# Patient Record
Sex: Male | Born: 1935
Health system: Southern US, Community
[De-identification: ages and names within clinical notes are randomized; demographics above are authoritative.]

## PROBLEM LIST (undated history)

## (undated) DIAGNOSIS — J449 Chronic obstructive pulmonary disease, unspecified: Secondary | ICD-10-CM

## (undated) DIAGNOSIS — G459 Transient cerebral ischemic attack, unspecified: Secondary | ICD-10-CM

## (undated) DIAGNOSIS — I10 Essential (primary) hypertension: Secondary | ICD-10-CM

## (undated) DIAGNOSIS — K8020 Calculus of gallbladder without cholecystitis without obstruction: Secondary | ICD-10-CM

## (undated) DIAGNOSIS — I639 Cerebral infarction, unspecified: Secondary | ICD-10-CM

## (undated) DIAGNOSIS — E119 Type 2 diabetes mellitus without complications: Secondary | ICD-10-CM

## (undated) DIAGNOSIS — H409 Unspecified glaucoma: Secondary | ICD-10-CM

## (undated) DIAGNOSIS — I679 Cerebrovascular disease, unspecified: Secondary | ICD-10-CM

## (undated) HISTORY — DX: Transient cerebral ischemic attack, unspecified: G45.9

## (undated) HISTORY — DX: Unspecified glaucoma: H40.9

## (undated) HISTORY — DX: Calculus of gallbladder without cholecystitis without obstruction: K80.20

## (undated) HISTORY — PX: CHOLECYSTECTOMY: SHX55

## (undated) HISTORY — DX: Chronic obstructive pulmonary disease, unspecified: J44.9

## (undated) HISTORY — PX: FRACTURE SURGERY: SHX138

## (undated) HISTORY — PX: INGUINAL HERNIA REPAIR: SUR1180

## (undated) HISTORY — PX: CATARACT EXTRACTION W/ INTRAOCULAR LENS  IMPLANT, BILATERAL: SHX1307

## (undated) HISTORY — PX: EYE SURGERY: SHX253

## (undated) HISTORY — PX: HERNIA REPAIR: SHX51

## (undated) HISTORY — DX: Cerebrovascular disease, unspecified: I67.9

## (undated) HISTORY — PX: COCHLEAR IMPLANT: SUR684

## (undated) HISTORY — PX: APPENDECTOMY: SHX54

## (undated) HISTORY — PX: TONSILLECTOMY: SUR1361

---
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
## 1969-05-30 HISTORY — PX: VASECTOMY: SHX75

## 2005-12-06 ENCOUNTER — Ambulatory Visit (HOSPITAL_BASED_OUTPATIENT_CLINIC_OR_DEPARTMENT_OTHER): Admission: RE | Admit: 2005-12-06 | Discharge: 2005-12-06 | Payer: Self-pay | Admitting: Surgery

## 2006-09-07 ENCOUNTER — Encounter: Admission: RE | Admit: 2006-09-07 | Discharge: 2006-09-07 | Payer: Self-pay | Admitting: Surgery

## 2006-09-11 ENCOUNTER — Ambulatory Visit (HOSPITAL_BASED_OUTPATIENT_CLINIC_OR_DEPARTMENT_OTHER): Admission: RE | Admit: 2006-09-11 | Discharge: 2006-09-11 | Payer: Self-pay | Admitting: Surgery

## 2011-07-01 DIAGNOSIS — I10 Essential (primary) hypertension: Secondary | ICD-10-CM | POA: Diagnosis not present

## 2011-07-01 DIAGNOSIS — E119 Type 2 diabetes mellitus without complications: Secondary | ICD-10-CM | POA: Diagnosis not present

## 2011-07-21 DIAGNOSIS — H409 Unspecified glaucoma: Secondary | ICD-10-CM | POA: Diagnosis not present

## 2011-07-21 DIAGNOSIS — H4011X0 Primary open-angle glaucoma, stage unspecified: Secondary | ICD-10-CM | POA: Diagnosis not present

## 2011-12-09 DIAGNOSIS — D126 Benign neoplasm of colon, unspecified: Secondary | ICD-10-CM | POA: Diagnosis not present

## 2011-12-09 DIAGNOSIS — K5730 Diverticulosis of large intestine without perforation or abscess without bleeding: Secondary | ICD-10-CM | POA: Diagnosis not present

## 2011-12-09 DIAGNOSIS — Z09 Encounter for follow-up examination after completed treatment for conditions other than malignant neoplasm: Secondary | ICD-10-CM | POA: Diagnosis not present

## 2011-12-09 DIAGNOSIS — Z86010 Personal history of colonic polyps: Secondary | ICD-10-CM | POA: Diagnosis not present

## 2012-01-13 DIAGNOSIS — E119 Type 2 diabetes mellitus without complications: Secondary | ICD-10-CM | POA: Diagnosis not present

## 2012-01-13 DIAGNOSIS — Z23 Encounter for immunization: Secondary | ICD-10-CM | POA: Diagnosis not present

## 2012-01-13 DIAGNOSIS — I10 Essential (primary) hypertension: Secondary | ICD-10-CM | POA: Diagnosis not present

## 2012-01-19 DIAGNOSIS — H409 Unspecified glaucoma: Secondary | ICD-10-CM | POA: Diagnosis not present

## 2012-01-19 DIAGNOSIS — H4011X0 Primary open-angle glaucoma, stage unspecified: Secondary | ICD-10-CM | POA: Diagnosis not present

## 2012-02-23 DIAGNOSIS — Z23 Encounter for immunization: Secondary | ICD-10-CM | POA: Diagnosis not present

## 2012-06-11 DIAGNOSIS — H6120 Impacted cerumen, unspecified ear: Secondary | ICD-10-CM | POA: Diagnosis not present

## 2012-06-11 DIAGNOSIS — H905 Unspecified sensorineural hearing loss: Secondary | ICD-10-CM | POA: Diagnosis not present

## 2012-07-20 DIAGNOSIS — E119 Type 2 diabetes mellitus without complications: Secondary | ICD-10-CM | POA: Diagnosis not present

## 2012-07-20 DIAGNOSIS — I10 Essential (primary) hypertension: Secondary | ICD-10-CM | POA: Diagnosis not present

## 2012-07-25 DIAGNOSIS — H409 Unspecified glaucoma: Secondary | ICD-10-CM | POA: Diagnosis not present

## 2012-07-25 DIAGNOSIS — H4011X0 Primary open-angle glaucoma, stage unspecified: Secondary | ICD-10-CM | POA: Diagnosis not present

## 2012-11-22 DIAGNOSIS — H4011X0 Primary open-angle glaucoma, stage unspecified: Secondary | ICD-10-CM | POA: Diagnosis not present

## 2012-11-22 DIAGNOSIS — H409 Unspecified glaucoma: Secondary | ICD-10-CM | POA: Diagnosis not present

## 2012-11-22 DIAGNOSIS — D3130 Benign neoplasm of unspecified choroid: Secondary | ICD-10-CM | POA: Diagnosis not present

## 2013-01-23 DIAGNOSIS — E119 Type 2 diabetes mellitus without complications: Secondary | ICD-10-CM | POA: Diagnosis not present

## 2013-01-23 DIAGNOSIS — I10 Essential (primary) hypertension: Secondary | ICD-10-CM | POA: Diagnosis not present

## 2013-03-26 DIAGNOSIS — Z23 Encounter for immunization: Secondary | ICD-10-CM | POA: Diagnosis not present

## 2013-04-04 DIAGNOSIS — H4011X0 Primary open-angle glaucoma, stage unspecified: Secondary | ICD-10-CM | POA: Diagnosis not present

## 2013-04-04 DIAGNOSIS — H409 Unspecified glaucoma: Secondary | ICD-10-CM | POA: Diagnosis not present

## 2013-05-07 DIAGNOSIS — R42 Dizziness and giddiness: Secondary | ICD-10-CM | POA: Diagnosis not present

## 2013-05-07 DIAGNOSIS — I951 Orthostatic hypotension: Secondary | ICD-10-CM | POA: Diagnosis not present

## 2013-06-03 DIAGNOSIS — R42 Dizziness and giddiness: Secondary | ICD-10-CM | POA: Diagnosis not present

## 2013-06-03 DIAGNOSIS — E7800 Pure hypercholesterolemia, unspecified: Secondary | ICD-10-CM | POA: Diagnosis not present

## 2013-06-03 DIAGNOSIS — E119 Type 2 diabetes mellitus without complications: Secondary | ICD-10-CM | POA: Diagnosis not present

## 2013-06-07 ENCOUNTER — Other Ambulatory Visit (HOSPITAL_BASED_OUTPATIENT_CLINIC_OR_DEPARTMENT_OTHER): Payer: Self-pay | Admitting: Family Medicine

## 2013-06-07 DIAGNOSIS — R42 Dizziness and giddiness: Secondary | ICD-10-CM

## 2013-06-08 ENCOUNTER — Ambulatory Visit (HOSPITAL_BASED_OUTPATIENT_CLINIC_OR_DEPARTMENT_OTHER)
Admission: RE | Admit: 2013-06-08 | Discharge: 2013-06-08 | Disposition: A | Discharge status: Home / Self Care | Payer: Medicare Other | Source: Ambulatory Visit | Attending: Family Medicine | Admitting: Family Medicine

## 2013-06-08 DIAGNOSIS — I6350 Cerebral infarction due to unspecified occlusion or stenosis of unspecified cerebral artery: Secondary | ICD-10-CM | POA: Insufficient documentation

## 2013-06-08 DIAGNOSIS — R42 Dizziness and giddiness: Secondary | ICD-10-CM

## 2013-06-08 DIAGNOSIS — G319 Degenerative disease of nervous system, unspecified: Secondary | ICD-10-CM | POA: Insufficient documentation

## 2013-06-19 DIAGNOSIS — H6120 Impacted cerumen, unspecified ear: Secondary | ICD-10-CM | POA: Diagnosis not present

## 2013-06-19 DIAGNOSIS — H905 Unspecified sensorineural hearing loss: Secondary | ICD-10-CM | POA: Diagnosis not present

## 2013-06-26 DIAGNOSIS — R42 Dizziness and giddiness: Secondary | ICD-10-CM | POA: Diagnosis not present

## 2013-06-26 DIAGNOSIS — I6350 Cerebral infarction due to unspecified occlusion or stenosis of unspecified cerebral artery: Secondary | ICD-10-CM | POA: Diagnosis not present

## 2013-07-04 DIAGNOSIS — R42 Dizziness and giddiness: Secondary | ICD-10-CM | POA: Diagnosis not present

## 2013-07-04 DIAGNOSIS — I6350 Cerebral infarction due to unspecified occlusion or stenosis of unspecified cerebral artery: Secondary | ICD-10-CM | POA: Diagnosis not present

## 2013-07-11 DIAGNOSIS — R42 Dizziness and giddiness: Secondary | ICD-10-CM | POA: Diagnosis not present

## 2013-07-11 DIAGNOSIS — I6350 Cerebral infarction due to unspecified occlusion or stenosis of unspecified cerebral artery: Secondary | ICD-10-CM | POA: Diagnosis not present

## 2013-07-31 DIAGNOSIS — E7800 Pure hypercholesterolemia, unspecified: Secondary | ICD-10-CM | POA: Diagnosis not present

## 2013-07-31 DIAGNOSIS — E119 Type 2 diabetes mellitus without complications: Secondary | ICD-10-CM | POA: Diagnosis not present

## 2013-07-31 DIAGNOSIS — I10 Essential (primary) hypertension: Secondary | ICD-10-CM | POA: Diagnosis not present

## 2013-07-31 DIAGNOSIS — I679 Cerebrovascular disease, unspecified: Secondary | ICD-10-CM | POA: Diagnosis not present

## 2013-07-31 DIAGNOSIS — Z79899 Other long term (current) drug therapy: Secondary | ICD-10-CM | POA: Diagnosis not present

## 2013-08-08 DIAGNOSIS — H409 Unspecified glaucoma: Secondary | ICD-10-CM | POA: Diagnosis not present

## 2013-08-08 DIAGNOSIS — H4011X0 Primary open-angle glaucoma, stage unspecified: Secondary | ICD-10-CM | POA: Diagnosis not present

## 2013-12-12 DIAGNOSIS — H409 Unspecified glaucoma: Secondary | ICD-10-CM | POA: Diagnosis not present

## 2013-12-12 DIAGNOSIS — H4011X0 Primary open-angle glaucoma, stage unspecified: Secondary | ICD-10-CM | POA: Diagnosis not present

## 2014-01-30 DIAGNOSIS — Z23 Encounter for immunization: Secondary | ICD-10-CM | POA: Diagnosis not present

## 2014-01-30 DIAGNOSIS — E119 Type 2 diabetes mellitus without complications: Secondary | ICD-10-CM | POA: Diagnosis not present

## 2014-01-30 DIAGNOSIS — I10 Essential (primary) hypertension: Secondary | ICD-10-CM | POA: Diagnosis not present

## 2014-01-30 DIAGNOSIS — E7800 Pure hypercholesterolemia, unspecified: Secondary | ICD-10-CM | POA: Diagnosis not present

## 2014-06-05 DIAGNOSIS — H4011X1 Primary open-angle glaucoma, mild stage: Secondary | ICD-10-CM | POA: Diagnosis not present

## 2014-06-11 DIAGNOSIS — H905 Unspecified sensorineural hearing loss: Secondary | ICD-10-CM | POA: Diagnosis not present

## 2014-06-26 DIAGNOSIS — Z974 Presence of external hearing-aid: Secondary | ICD-10-CM | POA: Diagnosis not present

## 2014-06-26 DIAGNOSIS — H903 Sensorineural hearing loss, bilateral: Secondary | ICD-10-CM | POA: Diagnosis not present

## 2014-06-26 DIAGNOSIS — H6123 Impacted cerumen, bilateral: Secondary | ICD-10-CM | POA: Diagnosis not present

## 2014-07-29 DIAGNOSIS — I10 Essential (primary) hypertension: Secondary | ICD-10-CM | POA: Diagnosis not present

## 2014-07-29 DIAGNOSIS — E119 Type 2 diabetes mellitus without complications: Secondary | ICD-10-CM | POA: Diagnosis not present

## 2014-07-29 DIAGNOSIS — E780 Pure hypercholesterolemia: Secondary | ICD-10-CM | POA: Diagnosis not present

## 2014-07-29 DIAGNOSIS — Z1389 Encounter for screening for other disorder: Secondary | ICD-10-CM | POA: Diagnosis not present

## 2014-09-09 DIAGNOSIS — Z0000 Encounter for general adult medical examination without abnormal findings: Secondary | ICD-10-CM | POA: Diagnosis not present

## 2014-12-11 DIAGNOSIS — H40013 Open angle with borderline findings, low risk, bilateral: Secondary | ICD-10-CM | POA: Diagnosis not present

## 2015-02-05 DIAGNOSIS — I10 Essential (primary) hypertension: Secondary | ICD-10-CM | POA: Diagnosis not present

## 2015-02-05 DIAGNOSIS — Z23 Encounter for immunization: Secondary | ICD-10-CM | POA: Diagnosis not present

## 2015-02-05 DIAGNOSIS — E780 Pure hypercholesterolemia: Secondary | ICD-10-CM | POA: Diagnosis not present

## 2015-02-05 DIAGNOSIS — E119 Type 2 diabetes mellitus without complications: Secondary | ICD-10-CM | POA: Diagnosis not present

## 2015-06-12 DIAGNOSIS — D3131 Benign neoplasm of right choroid: Secondary | ICD-10-CM | POA: Diagnosis not present

## 2015-06-12 DIAGNOSIS — H524 Presbyopia: Secondary | ICD-10-CM | POA: Diagnosis not present

## 2015-06-12 DIAGNOSIS — H40013 Open angle with borderline findings, low risk, bilateral: Secondary | ICD-10-CM | POA: Diagnosis not present

## 2015-07-22 DIAGNOSIS — H903 Sensorineural hearing loss, bilateral: Secondary | ICD-10-CM | POA: Diagnosis not present

## 2015-07-29 DIAGNOSIS — H9193 Unspecified hearing loss, bilateral: Secondary | ICD-10-CM | POA: Insufficient documentation

## 2015-07-29 DIAGNOSIS — H6123 Impacted cerumen, bilateral: Secondary | ICD-10-CM

## 2015-07-29 DIAGNOSIS — H903 Sensorineural hearing loss, bilateral: Secondary | ICD-10-CM | POA: Diagnosis not present

## 2015-07-29 DIAGNOSIS — Z974 Presence of external hearing-aid: Secondary | ICD-10-CM | POA: Diagnosis not present

## 2015-07-29 HISTORY — DX: Unspecified hearing loss, bilateral: H91.93

## 2015-07-29 HISTORY — DX: Impacted cerumen, bilateral: H61.23

## 2015-08-05 DIAGNOSIS — E119 Type 2 diabetes mellitus without complications: Secondary | ICD-10-CM | POA: Diagnosis not present

## 2015-08-05 DIAGNOSIS — Z23 Encounter for immunization: Secondary | ICD-10-CM | POA: Diagnosis not present

## 2015-08-05 DIAGNOSIS — E7800 Pure hypercholesterolemia, unspecified: Secondary | ICD-10-CM | POA: Diagnosis not present

## 2015-08-05 DIAGNOSIS — L84 Corns and callosities: Secondary | ICD-10-CM | POA: Diagnosis not present

## 2015-08-05 DIAGNOSIS — Z1389 Encounter for screening for other disorder: Secondary | ICD-10-CM | POA: Diagnosis not present

## 2015-08-05 DIAGNOSIS — I10 Essential (primary) hypertension: Secondary | ICD-10-CM | POA: Diagnosis not present

## 2015-08-05 DIAGNOSIS — I679 Cerebrovascular disease, unspecified: Secondary | ICD-10-CM | POA: Diagnosis not present

## 2015-08-14 DIAGNOSIS — M795 Residual foreign body in soft tissue: Secondary | ICD-10-CM | POA: Diagnosis not present

## 2015-12-17 DIAGNOSIS — D3131 Benign neoplasm of right choroid: Secondary | ICD-10-CM | POA: Diagnosis not present

## 2015-12-17 DIAGNOSIS — H524 Presbyopia: Secondary | ICD-10-CM | POA: Diagnosis not present

## 2015-12-17 DIAGNOSIS — H40013 Open angle with borderline findings, low risk, bilateral: Secondary | ICD-10-CM | POA: Diagnosis not present

## 2016-02-19 DIAGNOSIS — E7800 Pure hypercholesterolemia, unspecified: Secondary | ICD-10-CM | POA: Diagnosis not present

## 2016-02-19 DIAGNOSIS — I10 Essential (primary) hypertension: Secondary | ICD-10-CM | POA: Diagnosis not present

## 2016-02-19 DIAGNOSIS — I679 Cerebrovascular disease, unspecified: Secondary | ICD-10-CM | POA: Diagnosis not present

## 2016-02-19 DIAGNOSIS — E119 Type 2 diabetes mellitus without complications: Secondary | ICD-10-CM | POA: Diagnosis not present

## 2016-02-19 DIAGNOSIS — Z23 Encounter for immunization: Secondary | ICD-10-CM | POA: Diagnosis not present

## 2016-05-16 DIAGNOSIS — J069 Acute upper respiratory infection, unspecified: Secondary | ICD-10-CM | POA: Diagnosis not present

## 2016-07-07 DIAGNOSIS — H04123 Dry eye syndrome of bilateral lacrimal glands: Secondary | ICD-10-CM | POA: Diagnosis not present

## 2016-07-07 DIAGNOSIS — H40013 Open angle with borderline findings, low risk, bilateral: Secondary | ICD-10-CM | POA: Diagnosis not present

## 2016-07-07 DIAGNOSIS — D3131 Benign neoplasm of right choroid: Secondary | ICD-10-CM | POA: Diagnosis not present

## 2016-08-11 DIAGNOSIS — I679 Cerebrovascular disease, unspecified: Secondary | ICD-10-CM | POA: Diagnosis not present

## 2016-08-11 DIAGNOSIS — Z23 Encounter for immunization: Secondary | ICD-10-CM | POA: Diagnosis not present

## 2016-08-11 DIAGNOSIS — Z0000 Encounter for general adult medical examination without abnormal findings: Secondary | ICD-10-CM | POA: Diagnosis not present

## 2016-08-11 DIAGNOSIS — E119 Type 2 diabetes mellitus without complications: Secondary | ICD-10-CM | POA: Diagnosis not present

## 2016-08-11 DIAGNOSIS — E7800 Pure hypercholesterolemia, unspecified: Secondary | ICD-10-CM | POA: Diagnosis not present

## 2016-08-11 DIAGNOSIS — I10 Essential (primary) hypertension: Secondary | ICD-10-CM | POA: Diagnosis not present

## 2016-08-24 DIAGNOSIS — Z7289 Other problems related to lifestyle: Secondary | ICD-10-CM | POA: Diagnosis not present

## 2016-08-24 DIAGNOSIS — H903 Sensorineural hearing loss, bilateral: Secondary | ICD-10-CM | POA: Diagnosis not present

## 2016-08-24 DIAGNOSIS — Z974 Presence of external hearing-aid: Secondary | ICD-10-CM | POA: Diagnosis not present

## 2016-08-24 DIAGNOSIS — H6123 Impacted cerumen, bilateral: Secondary | ICD-10-CM | POA: Diagnosis not present

## 2016-10-31 ENCOUNTER — Emergency Department (HOSPITAL_BASED_OUTPATIENT_CLINIC_OR_DEPARTMENT_OTHER)
Admission: EM | Admit: 2016-10-31 | Discharge: 2016-10-31 | Disposition: A | Discharge status: Home / Self Care | Payer: Medicare Other | Attending: Emergency Medicine | Admitting: Emergency Medicine

## 2016-10-31 ENCOUNTER — Emergency Department (HOSPITAL_BASED_OUTPATIENT_CLINIC_OR_DEPARTMENT_OTHER): Payer: Medicare Other

## 2016-10-31 ENCOUNTER — Encounter (HOSPITAL_BASED_OUTPATIENT_CLINIC_OR_DEPARTMENT_OTHER): Payer: Self-pay | Admitting: Emergency Medicine

## 2016-10-31 ENCOUNTER — Emergency Department (HOSPITAL_COMMUNITY): Payer: Medicare Other

## 2016-10-31 DIAGNOSIS — E119 Type 2 diabetes mellitus without complications: Secondary | ICD-10-CM | POA: Insufficient documentation

## 2016-10-31 DIAGNOSIS — R42 Dizziness and giddiness: Secondary | ICD-10-CM | POA: Insufficient documentation

## 2016-10-31 DIAGNOSIS — I10 Essential (primary) hypertension: Secondary | ICD-10-CM | POA: Insufficient documentation

## 2016-10-31 DIAGNOSIS — R260 Ataxic gait: Secondary | ICD-10-CM | POA: Diagnosis not present

## 2016-10-31 DIAGNOSIS — H8193 Unspecified disorder of vestibular function, bilateral: Secondary | ICD-10-CM | POA: Diagnosis not present

## 2016-10-31 DIAGNOSIS — R269 Unspecified abnormalities of gait and mobility: Secondary | ICD-10-CM | POA: Diagnosis not present

## 2016-10-31 DIAGNOSIS — Z8673 Personal history of transient ischemic attack (TIA), and cerebral infarction without residual deficits: Secondary | ICD-10-CM | POA: Insufficient documentation

## 2016-10-31 HISTORY — DX: Type 2 diabetes mellitus without complications: E11.9

## 2016-10-31 HISTORY — DX: Essential (primary) hypertension: I10

## 2016-10-31 HISTORY — DX: Cerebral infarction, unspecified: I63.9

## 2016-10-31 LAB — CBG MONITORING, ED: Glucose-Capillary: 141 mg/dL — ABNORMAL HIGH (ref 65–99)

## 2016-10-31 LAB — BASIC METABOLIC PANEL
ANION GAP: 6 (ref 5–15)
BUN: 18 mg/dL (ref 6–20)
CO2: 28 mmol/L (ref 22–32)
Calcium: 9.2 mg/dL (ref 8.9–10.3)
Chloride: 102 mmol/L (ref 101–111)
Creatinine, Ser: 1.01 mg/dL (ref 0.61–1.24)
GFR calc Af Amer: >60 mL/min (ref >60)
GFR calc non Af Amer: >60 mL/min (ref >60)
GLUCOSE: 149 mg/dL — AB (ref 65–99)
POTASSIUM: 4 mmol/L (ref 3.5–5.1)
Sodium: 136 mmol/L (ref 135–145)

## 2016-10-31 LAB — URINALYSIS, ROUTINE W REFLEX MICROSCOPIC
Bilirubin Urine: NEGATIVE
GLUCOSE, UA: NEGATIVE mg/dL
HGB URINE DIPSTICK: NEGATIVE
Ketones, ur: NEGATIVE mg/dL
Leukocytes, UA: NEGATIVE
Nitrite: NEGATIVE
PH: 7 (ref 5.0–8.0)
Protein, ur: NEGATIVE mg/dL
SPECIFIC GRAVITY, URINE: 1.01 (ref 1.005–1.030)

## 2016-10-31 LAB — CBC
HEMATOCRIT: 38.1 % — AB (ref 39.0–52.0)
HEMOGLOBIN: 13.4 g/dL (ref 13.0–17.0)
MCH: 31.2 pg (ref 26.0–34.0)
MCHC: 35.2 g/dL (ref 30.0–36.0)
MCV: 88.6 fL (ref 78.0–100.0)
Platelets: 178 10*3/uL (ref 150–400)
RBC: 4.3 MIL/uL (ref 4.22–5.81)
RDW: 13.3 % (ref 11.5–15.5)
WBC: 6.1 10*3/uL (ref 4.0–10.5)

## 2016-10-31 MED ORDER — GADOBENATE DIMEGLUMINE 529 MG/ML IV SOLN
15.0000 mL | Freq: Once | INTRAVENOUS | Status: AC
  Administered 2016-10-31: 12 mL via INTRAVENOUS

## 2016-10-31 MED ORDER — MECLIZINE HCL 25 MG PO TABS: 25.0000 mg | ORAL_TABLET | Freq: Three times a day (TID) | ORAL | 0 refills | Status: DC | PRN

## 2016-10-31 MED ORDER — MECLIZINE HCL 25 MG PO TABS
25.0000 mg | ORAL_TABLET | Freq: Once | ORAL | Status: AC
  Administered 2016-10-31: 25 mg via ORAL
  Filled 2016-10-31: qty 1

## 2016-10-31 NOTE — ED Notes (Signed)
Patient transported to CT 

## 2016-10-31 NOTE — ED Provider Notes (Signed)
  Physical Exam  BP (!) 155/80 (BP Location: Right Arm)   Pulse 68   Temp 98.3 F (36.8 C) (Oral)   Resp 16   Ht 5\' 10"  (1.778 m)   Wt 57.2 kg (126 lb)   SpO2 99%   BMI 18.08 kg/m   Physical Exam  ED Course  Procedures  MDM     MRI results reviewed. No acute stroke but does have potential chronic vasculitis. Will follow-up with neurology. Discharge home. Discussed with patient and his family member.    Davonna Belling, MD 10/31/16 1924

## 2016-10-31 NOTE — ED Notes (Signed)
Pt attempted to urinate Pt was unable too.

## 2016-10-31 NOTE — ED Notes (Signed)
Patient able to ambulate independently  

## 2016-10-31 NOTE — ED Triage Notes (Signed)
Pt with intermittent dizzy spells since Friday. Wife states last night he went to bed at 12 midnight and was fine. This morning at 0800 pt reports dizziness and head fullness. Hx of stroke. A/O x4.

## 2016-10-31 NOTE — ED Notes (Signed)
Patient updated on delay for MRI

## 2016-10-31 NOTE — ED Provider Notes (Signed)
[place] DEPT Provider Note   CSN: [id number] Arrival date & time: 10/31/16  5465     History   Chief Complaint Chief Complaint  Patient presents with  . Dizziness    HPI Raymond Fowler is a 83 y.o. male.  HPI  This is a 83 year old man with a history of diabetes, hypertension, and stroke who presents here from [place] for [place]. Patient states he has had difficulties with his balance since Friday. He describes it as things being off center and he is having difficulty walking secondary to this. His symptoms improved somewhat over the weekend it worsened again today. They have not been resolved since Friday. He denies a headache. He is not on anticoagulants. He denies any visual changes, difficulty speaking, chest pain, nausea, vomiting, abdominal pain, or difficulty swallowing.   Past Medical History:  Diagnosis Date  . Diabetes mellitus without complication ([place])   . Hypertension   . Stroke [facility])     There are no active problems to display for this patient.   Past Surgical History:  Procedure Laterality Date  . APPENDECTOMY    . CHOLECYSTECTOMY    . EYE SURGERY    . FRACTURE SURGERY    . HERNIA REPAIR    . TONSILLECTOMY         Home Medications    Prior to Admission medications   Not on File    Family History History reviewed. No pertinent family history.  Social History Social History  Substance Use Topics  . Smoking status: Never Smoker  . Smokeless tobacco: Never Used  . Alcohol use Yes     Comment: Occaisionally      Allergies   Patient has no known allergies.   Review of Systems Review of Systems  All other systems reviewed and are negative.    Physical Exam Updated Vital Signs BP (!) 174/102   Pulse 83   Temp 98.3 F (36.8 C) (Oral)   Resp 13   Ht 1.778 m (5\' 10" )   Wt 57.2 kg (126 lb)   SpO2 96%   BMI 18.08 kg/m   Physical Exam  Constitutional: He is oriented to person, place, and time. He appears  well-developed and well-nourished. No distress.  HENT:  Head: Normocephalic and atraumatic.  Right Ear: External ear normal.  Left Ear: External ear normal.  Eyes: Pupils are equal, round, and reactive to light.  Neck: Normal range of motion. Neck supple.  Cardiovascular: Normal rate and regular rhythm.   Pulmonary/Chest: Effort normal.  Abdominal: Soft.  Musculoskeletal: Normal range of motion.  Neurological: He is alert and oriented to person, place, and time. He displays normal reflexes. No cranial nerve deficit or sensory deficit. He exhibits normal muscle tone. Coordination normal.  Skin: Skin is warm and dry. Capillary refill takes less than 2 seconds.  Psychiatric: He has a normal mood and affect.  Nursing note and vitals reviewed.    ED Treatments / Results  Labs (all labs ordered are listed, but only abnormal results are displayed) Labs Reviewed  BASIC METABOLIC PANEL - Abnormal; Notable for the following:       Result Value   Glucose, Bld 149 (*)    All other components within normal limits  CBC - Abnormal; Notable for the following:    HCT 38.1 (*)    All other components within normal limits  CBG MONITORING, ED - Abnormal; Notable for the following:    Glucose-Capillary 141 (*)    All  other components within normal limits  URINALYSIS, ROUTINE W REFLEX MICROSCOPIC    EKG  EKG Interpretation  Date/Time:  Monday October 31 2016 09:37:03 EDT Ventricular Rate:  74 PR Interval:    QRS Duration: 96 QT Interval:  395 QTC Calculation: 439 R Axis:   79 Text Interpretation:  Sinus rhythm Anteroseptal infarct, age indeterminate Baseline wander in lead(s) I III aVL V3 No significant change since last tracing Confirmed by [facility] MD, Junie Panning ([id number]) on 10/31/2016 10:01:44 AM Also confirmed by [facility] MD, [place] ([id number]), editor Verna Czech [phone])  on 10/31/2016 10:47:03 AM       Radiology Ct Head Wo Contrast  Result Date: 10/31/2016 CLINICAL DATA:  Intermittent dizzy  spells since 10/28/2016. EXAM: CT HEAD WITHOUT CONTRAST TECHNIQUE: Contiguous axial images were obtained from the base of the skull through the vertex without intravenous contrast. COMPARISON:  Brain MRI 06/08/2013 FINDINGS: Brain: No evidence of acute infarction, hemorrhage, hydrocephalus, extra-axial collection or mass lesion/mass effect. Chronic small vessel ischemia with extensive gliosis throughout the cerebral white matter. Mild for age generalized volume loss. Vascular: Atherosclerotic calcification.  No hyperdense vessel. Skull: No acute or aggressive finding. Sinuses/Orbits: Negative.  No mastoid or middle ear opacification IMPRESSION: 1. No acute finding. 2. Extensive chronic small vessel disease. Electronically Signed   By: Monte Fantasia M.D.   On: 10/31/2016 10:22    Procedures Procedures (including critical care time)  Medications Ordered in ED Medications  meclizine (ANTIVERT) tablet 25 mg (25 mg Oral Given 10/31/16 1154)     Initial Impression / Assessment and Plan / ED Course  I have reviewed the triage vital signs and the nursing notes.  Pertinent labs & imaging results that were available during my care of the patient were reviewed by me and considered in my medical decision making (see chart for details).     If MRI is without acute stroke or other abnormality and patient is able to ambulate, may be discharged Signed out to Dr. Alvino Chapel  Final Clinical Impressions(s) / ED Diagnoses   Final diagnoses:  Disequilibrium  Gait abnormality    New Prescriptions New Prescriptions   No medications on file     Pattricia Boss, MD 10/31/16 1658

## 2016-10-31 NOTE — ED Notes (Signed)
MD updated patient's family while patient at scan

## 2016-10-31 NOTE — Discharge Instructions (Addendum)
Use assistance when walking. Call [facility] Neurology for follow up.  The MRI showed a potential vasculitis that needs to be followed.

## 2016-10-31 NOTE — ED Notes (Signed)
Patient transported to MRI 

## 2016-10-31 NOTE — ED Notes (Signed)
ED Provider at bedside. 

## 2016-10-31 NOTE — ED Notes (Signed)
MD at bedside updating pt and family.

## 2016-10-31 NOTE — ED Provider Notes (Signed)
[place] DEPT [facility] Provider Note   CSN: [id number] Arrival date & time: 10/31/16  9622     History   Chief Complaint Chief Complaint  Patient presents with  . Dizziness    HPI Raymond Fowler is a 83 y.o. male.  HPI   Dizziness since Friday.  Room not spinning. Feels off balance while walking and also lightheaded like going to pass out.  Saturday had improved, then yesterday was ok, but this morning symptoms were worse, started back today.  No numbness/weakness, no difficulty speaking. Has had problems ambulating.   No change in vision.  No ear pain, no runny nose, no headache. Nausea.  No cough, no chest pain, no dyspnea.   Has had occasional symptoms like this but not something this severe.  History of stroke, symptoms today are similar--but the room isn't spinning this time. Does feel the off balance, nausea, difficulty walking are similar to stroke.  Dec 2014. Deficits improved after last stroke.  No trauma or falls.   Past Medical History:  Diagnosis Date  . Diabetes mellitus without complication ([place])   . Hypertension   . Stroke [facility])     There are no active problems to display for this patient.   Past Surgical History:  Procedure Laterality Date  . APPENDECTOMY    . CHOLECYSTECTOMY    . EYE SURGERY    . FRACTURE SURGERY    . HERNIA REPAIR    . TONSILLECTOMY         Home Medications    Prior to Admission medications   Not on File    Family History History reviewed. No pertinent family history.  Social History Social History  Substance Use Topics  . Smoking status: Never Smoker  . Smokeless tobacco: Never Used  . Alcohol use Yes     Comment: Occaisionally      Allergies   Patient has no known allergies.   Review of Systems Review of Systems  Constitutional: Negative for fever.  HENT: Negative for sore throat.   Eyes: Negative for visual disturbance.  Respiratory: Negative for shortness of breath.   Cardiovascular: Negative for chest  pain.  Gastrointestinal: Positive for nausea. Negative for abdominal pain.  Genitourinary: Negative for difficulty urinating.  Musculoskeletal: Positive for gait problem. Negative for back pain and neck stiffness.  Skin: Negative for rash.  Neurological: Positive for dizziness and light-headedness. Negative for syncope, speech difficulty, weakness, numbness and headaches.     Physical Exam Updated Vital Signs BP (!) 147/77   Pulse 64   Temp 98.3 F (36.8 C) (Oral)   Resp 11   Ht 5\' 10"  (1.778 m)   Wt 57.2 kg (126 lb)   SpO2 98%   BMI 18.08 kg/m   Physical Exam  Constitutional: He is oriented to person, place, and time. He appears well-developed and well-nourished. No distress.  HENT:  Head: Normocephalic and atraumatic.  Eyes: Conjunctivae and EOM are normal.  Question lower visual field cut but on reeval appears normal  Neck: Normal range of motion.  Cardiovascular: Normal rate, regular rhythm, normal heart sounds and intact distal pulses.  Exam reveals no gallop and no friction rub.   No murmur heard. Pulmonary/Chest: Effort normal and breath sounds normal. No respiratory distress. He has no wheezes. He has no rales.  Abdominal: Soft. He exhibits no distension. There is no tenderness. There is no guarding.  Musculoskeletal: He exhibits no edema.  Neurological: He is alert and oriented to person, place, and time. He  has normal strength. No cranial nerve deficit or sensory deficit. Gait (ataxia, falling towards left) abnormal. Coordination (normal finger to nose) normal. GCS eye subscore is 4. GCS verbal subscore is 5. GCS motor subscore is 6.  Skin: Skin is warm and dry. He is not diaphoretic.  Nursing note and vitals reviewed.    ED Treatments / Results  Labs (all labs ordered are listed, but only abnormal results are displayed) Labs Reviewed  BASIC METABOLIC PANEL - Abnormal; Notable for the following:       Result Value   Glucose, Bld 149 (*)    All other components  within normal limits  CBC - Abnormal; Notable for the following:    HCT 38.1 (*)    All other components within normal limits  CBG MONITORING, ED - Abnormal; Notable for the following:    Glucose-Capillary 141 (*)    All other components within normal limits  URINALYSIS, ROUTINE W REFLEX MICROSCOPIC    EKG  EKG Interpretation  Date/Time:  Monday October 31 2016 09:37:03 EDT Ventricular Rate:  74 PR Interval:    QRS Duration: 96 QT Interval:  395 QTC Calculation: 439 R Axis:   79 Text Interpretation:  Sinus rhythm Anteroseptal infarct, age indeterminate Baseline wander in lead(s) I III aVL V3 No significant change since last tracing Confirmed by [facility] MD, Junie Panning ([id number]) on 10/31/2016 10:01:44 AM Also confirmed by [facility] MD, [place] ([id number]), editor Verna Czech [phone])  on 10/31/2016 10:47:03 AM       Radiology Ct Head Wo Contrast  Result Date: 10/31/2016 CLINICAL DATA:  Intermittent dizzy spells since 10/28/2016. EXAM: CT HEAD WITHOUT CONTRAST TECHNIQUE: Contiguous axial images were obtained from the base of the skull through the vertex without intravenous contrast. COMPARISON:  Brain MRI 06/08/2013 FINDINGS: Brain: No evidence of acute infarction, hemorrhage, hydrocephalus, extra-axial collection or mass lesion/mass effect. Chronic small vessel ischemia with extensive gliosis throughout the cerebral white matter. Mild for age generalized volume loss. Vascular: Atherosclerotic calcification.  No hyperdense vessel. Skull: No acute or aggressive finding. Sinuses/Orbits: Negative.  No mastoid or middle ear opacification IMPRESSION: 1. No acute finding. 2. Extensive chronic small vessel disease. Electronically Signed   By: Monte Fantasia M.D.   On: 10/31/2016 10:22    Procedures Procedures (including critical care time)  Medications Ordered in ED Medications  meclizine (ANTIVERT) tablet 25 mg (not administered)     Initial Impression / Assessment and Plan / ED Course  I have  reviewed the triage vital signs and the nursing notes.  Pertinent labs & imaging results that were available during my care of the patient were reviewed by me and considered in my medical decision making (see chart for details).     83 year old male with a history of CVA, hypertension, diabetes, with prior CVA in 2014 symptoms of vertigo and disequilibrium which resolved who presents with concern for lightheadedness and disequilibrium.  Denies chest pain or shortness of breath, have low suspicion for ACS or pulmonary embolus. Symptoms have been present and waxing and waning over the weekend prior to worsening today. Do not feel these symptoms clearly represent TIA, given waxing/waning status.  Denies vertigo and feel symptoms are not consistent with peripheral vertigo, however given some worsening with lifting head in ED, will also trial meclizine.  Possible CVA reactivation given distribution of symptoms similar to prior CVA.  Labs, CT head, ECG without significant abnormalities.    Concern symptoms may represent posterior CVA.  Will transfer to [facility] for MRI.  Dr. Tomi Bamberger accepting the patient. If MR negative and patient is clinically stable, able to ambulate, feel he may be discharged with outpatient follow up. Discussed with Neurology given waxing/waning symptoms agree that if MRI negative pt may follow up as outpatient if able to ambulate and clinically stable.   Final Clinical Impressions(s) / ED Diagnoses   Final diagnoses:  Disequilibrium  Gait abnormality    New Prescriptions New Prescriptions   No medications on file     Gareth Morgan, MD 10/31/16 1057

## 2016-11-04 ENCOUNTER — Ambulatory Visit (INDEPENDENT_AMBULATORY_CARE_PROVIDER_SITE_OTHER): Payer: Medicare Other | Admitting: Neurology

## 2016-11-04 ENCOUNTER — Encounter: Payer: Self-pay | Admitting: Neurology

## 2016-11-04 VITALS — BP 136/83 | HR 73 | Ht 70.0 in | Wt 131.0 lb

## 2016-11-04 DIAGNOSIS — I679 Cerebrovascular disease, unspecified: Secondary | ICD-10-CM

## 2016-11-04 DIAGNOSIS — R42 Dizziness and giddiness: Secondary | ICD-10-CM | POA: Diagnosis not present

## 2016-11-04 HISTORY — DX: Cerebrovascular disease, unspecified: I67.9

## 2016-11-04 HISTORY — DX: Dizziness and giddiness: R42

## 2016-11-04 NOTE — Patient Instructions (Signed)
   We will check a carotid doppler study to look at the blood circulation to the brain. 

## 2016-11-04 NOTE — Progress Notes (Signed)
Reason for visit: Dizziness  Referring physician: [facility]  Marsean Elkhatib is a 83 y.o. male  History of present illness:  Mr. Raymond Fowler is an 83 year old right-handed white male with a history of significant cerebrovascular disease. The patient sustained a small left cerebellar stroke in January 2015. The patient had some problems with vertigo and gait disturbance around that time. He has a significant degree of chronic bihemispheric small vessel disease. He is on aspirin therapy. He went to the [facility] on 10/31/2016 when he noted significant dizziness when he got up out of bed to go to the bathroom. He had difficulty walking. This dizziness lasted for about a half an hour and then cleared. The patient denied any true vertigo, he did not have nausea. He reported no numbness or weakness of the extremities, double vision, loss of vision, slurred speech, or syncope. He denied any chest pain, palpitations of the heart, or shortness of breath. The patient has not had recurrence of this event since that time. He was given meclizine for dizziness. He underwent a MRI of the brain which showed extensive small vessel disease, he did not have any acute changes seen. The patient does have some mild gait instability, he has not had any falls, he does not use a cane for ambulation. He normally walks about 3 miles a day. He denies any difficulty controlling the bowels or the bladder, he denies a history of memory problems. He is sent to this office for further evaluation.  Past Medical History:  Diagnosis Date  . Diabetes mellitus without complication ([place])   . Hypertension   . Stroke [facility])     Past Surgical History:  Procedure Laterality Date  . APPENDECTOMY    . CHOLECYSTECTOMY    . EYE SURGERY    . FRACTURE SURGERY    . HERNIA REPAIR    . TONSILLECTOMY      Family History  Problem Relation Age of Onset  . Diabetes Sister     Social history:  reports that he has never smoked. He has  never used smokeless tobacco. He reports that he drinks alcohol. He reports that he does not use drugs.  Medications:  Prior to Admission medications   Medication Sig Start Date End Date Taking? Authorizing Provider  Ascorbic Acid (VITAMIN C) 1000 MG tablet Take 1,000 mg by mouth daily.   Yes [provider]  aspirin EC 325 MG tablet Take 325 mg by mouth daily.   Yes [provider]  atorvastatin (LIPITOR) 20 MG tablet Take 20 mg by mouth every evening. 07/18/15  Yes [provider]  brimonidine (ALPHAGAN) 0.2 % ophthalmic solution Place 1 drop into both eyes 2 (two) times daily. 05/29/15  Yes [provider]  Carboxymethylcellul-Glycerin (OPTIVE) 0.5-0.9 % SOLN Place 1 drop into both eyes 2 (two) times daily.   Yes [provider]  Chromium Picolinate 1000 MCG TABS Take 1,000 mg by mouth daily.   Yes [provider]  cycloSPORINE (RESTASIS) 0.05 % ophthalmic emulsion Place 1 drop into both eyes 2 (two) times daily.   Yes [provider]  lisinopril (PRINIVIL,ZESTRIL) 10 MG tablet Take 10 mg by mouth daily. 07/18/15  Yes [provider]  meclizine (ANTIVERT) 25 MG tablet Take 1 tablet (25 mg total) by mouth 3 (three) times daily as needed for dizziness. 10/31/16  Yes Pattricia Boss, MD  Multiple Vitamins-Minerals (MULTIVITAMIN WITH MINERALS) tablet Take 1 tablet by mouth daily.   Yes [provider]  Omega-3 Fatty Acids (FISH OIL) 1200 MG CAPS Take 1,200 mg by mouth 2 (two) times daily.   Yes [provider]     No Known Allergies  ROS:  Out of a complete 14 system review of symptoms, the patient complains only of the following symptoms, and all other reviewed systems are negative.  Hearing loss, bilateral hearing aids Constipation Easy bruising Feeling hot, cold Dizziness  Blood pressure 136/83, pulse 73, height 5\' 10"  (1.778 m), weight 131 lb (59.4 kg).   Blood pressure, right arm, sitting is  152/70. Right arm, standing, is 144/76.  Physical Exam  General: The patient is alert and cooperative at the time of the examination. The patient is hard of hearing.  Eyes: Pupils are equal, round, and reactive to light. Discs are flat bilaterally.  Neck: The neck is supple, no carotid bruits are noted.  Respiratory: The respiratory examination is clear.  Cardiovascular: The cardiovascular examination reveals a regular rate and rhythm, no obvious murmurs or rubs are noted.  Skin: Extremities are without significant edema.  Neurologic Exam  Mental status: The patient is alert and oriented x 3 at the time of the examination. The patient has apparent normal recent and remote memory, with an apparently normal attention span and concentration ability.  Cranial nerves: Facial symmetry is present. There is good sensation of the face to pinprick and soft touch bilaterally. The strength of the facial muscles and the muscles to head turning and shoulder shrug are normal bilaterally. Speech is well enunciated, no aphasia or dysarthria is noted. Extraocular movements are full. Visual fields are full. The tongue is midline, and the patient has symmetric elevation of the soft palate. No obvious hearing deficits are noted.  Motor: The motor testing reveals 5 over 5 strength of all 4 extremities. Good symmetric motor tone is noted throughout.  Sensory: Sensory testing is intact to pinprick, soft touch, vibration sensation, and position sense on all 4 extremities. No evidence of extinction is noted.  Coordination: Cerebellar testing reveals good finger-nose-finger and heel-to-shin bilaterally.  Gait and station: Gait is minimally wide-based. Tandem gait is unsteady. Romberg is negative. No drift is seen.  Reflexes: Deep tendon reflexes are symmetric and normal bilaterally. Toes are downgoing bilaterally.   MRI brain 10/31/16:  IMPRESSION: 1. Expected evolution of left cerebellar infarcts since the  prior exam. 2. Stable advanced atrophy and diffuse white matter disease. This likely reflects the sequela of chronic microvascular ischemia. 3. Progression of susceptibility foci within the cerebellum bilaterally compatible with remote hemorrhages. This raises concern for an underlying vasculitis such as amyloid angiopathy.  * MRI scan images were reviewed online. I agree with the written report.    Assessment/Plan:  1. Extensive white matter disease by MRI brain  2. Transient episode of dizziness  The patient is at or near his usual baseline. MRI of the brain is notable for extensive white matter changes that is severe enough that it may cause chronic dizziness, balance problems, and memory issues. The patient however noted a transient episode of dizziness. The patient will be sent for carotid Doppler study, he will stay on aspirin therapy. He will contact our office if he has more episodes of dizziness.  Jill Alexanders MD 11/04/2016 12:01 PM  [facility] [address] [place], [place] [address]  Phone [phone] Fax [phone]

## 2016-11-10 ENCOUNTER — Telehealth: Payer: Self-pay | Admitting: Neurology

## 2016-11-10 ENCOUNTER — Ambulatory Visit (HOSPITAL_COMMUNITY)
Admission: RE | Admit: 2016-11-10 | Discharge: 2016-11-10 | Disposition: A | Discharge status: Home / Self Care | Payer: Medicare Other | Source: Ambulatory Visit | Attending: Neurology | Admitting: Neurology

## 2016-11-10 DIAGNOSIS — R42 Dizziness and giddiness: Secondary | ICD-10-CM

## 2016-11-10 DIAGNOSIS — I6523 Occlusion and stenosis of bilateral carotid arteries: Secondary | ICD-10-CM | POA: Diagnosis not present

## 2016-11-10 DIAGNOSIS — I679 Cerebrovascular disease, unspecified: Secondary | ICD-10-CM

## 2016-11-10 LAB — VAS US CAROTID
LCCADDIAS: -15 cm/s
LCCADSYS: -69 cm/s
LCCAPDIAS: 12 cm/s
LEFT ECA DIAS: -1 cm/s
LEFT VERTEBRAL DIAS: 16 cm/s
LICADDIAS: -12 cm/s
LICADSYS: -86 cm/s
LICAPDIAS: -28 cm/s
Left CCA prox sys: 92 cm/s
Left ICA prox sys: -105 cm/s
RCCAPDIAS: 17 cm/s
RIGHT ECA DIAS: -2 cm/s
RIGHT VERTEBRAL DIAS: -5 cm/s
Right CCA prox sys: 98 cm/s

## 2016-11-10 NOTE — Telephone Encounter (Signed)
I called the patient, talk with the wife. The carotid Doppler study was normal. If the patient continues to have dizziness, he is to contact our office.

## 2016-11-10 NOTE — Progress Notes (Signed)
VASCULAR LAB PRELIMINARY  PRELIMINARY  PRELIMINARY  PRELIMINARY  Carotid duplex completed.    Preliminary report:  Bilateral:  1-39% ICA stenosis.  Vertebral artery flow is antegrade.     Ford Peddie, RVS 11/10/2016, 12:27 PM

## 2017-01-05 DIAGNOSIS — H524 Presbyopia: Secondary | ICD-10-CM | POA: Diagnosis not present

## 2017-01-05 DIAGNOSIS — H04123 Dry eye syndrome of bilateral lacrimal glands: Secondary | ICD-10-CM | POA: Diagnosis not present

## 2017-01-05 DIAGNOSIS — D3131 Benign neoplasm of right choroid: Secondary | ICD-10-CM | POA: Diagnosis not present

## 2017-01-05 DIAGNOSIS — H40013 Open angle with borderline findings, low risk, bilateral: Secondary | ICD-10-CM | POA: Diagnosis not present

## 2017-03-01 DIAGNOSIS — Z23 Encounter for immunization: Secondary | ICD-10-CM | POA: Diagnosis not present

## 2017-03-20 DIAGNOSIS — H903 Sensorineural hearing loss, bilateral: Secondary | ICD-10-CM | POA: Diagnosis not present

## 2017-04-28 DIAGNOSIS — Z974 Presence of external hearing-aid: Secondary | ICD-10-CM | POA: Diagnosis not present

## 2017-04-28 DIAGNOSIS — H6123 Impacted cerumen, bilateral: Secondary | ICD-10-CM | POA: Diagnosis not present

## 2017-04-28 DIAGNOSIS — H903 Sensorineural hearing loss, bilateral: Secondary | ICD-10-CM | POA: Diagnosis not present

## 2017-04-28 DIAGNOSIS — Z7289 Other problems related to lifestyle: Secondary | ICD-10-CM | POA: Diagnosis not present

## 2017-06-16 DIAGNOSIS — L249 Irritant contact dermatitis, unspecified cause: Secondary | ICD-10-CM | POA: Diagnosis not present

## 2017-06-16 DIAGNOSIS — L570 Actinic keratosis: Secondary | ICD-10-CM | POA: Diagnosis not present

## 2017-06-16 DIAGNOSIS — L821 Other seborrheic keratosis: Secondary | ICD-10-CM | POA: Diagnosis not present

## 2017-07-20 DIAGNOSIS — D3131 Benign neoplasm of right choroid: Secondary | ICD-10-CM | POA: Diagnosis not present

## 2017-07-20 DIAGNOSIS — H04123 Dry eye syndrome of bilateral lacrimal glands: Secondary | ICD-10-CM | POA: Diagnosis not present

## 2017-07-20 DIAGNOSIS — H524 Presbyopia: Secondary | ICD-10-CM | POA: Diagnosis not present

## 2017-07-20 DIAGNOSIS — H40013 Open angle with borderline findings, low risk, bilateral: Secondary | ICD-10-CM | POA: Diagnosis not present

## 2017-07-20 DIAGNOSIS — H10413 Chronic giant papillary conjunctivitis, bilateral: Secondary | ICD-10-CM | POA: Diagnosis not present

## 2017-09-06 DIAGNOSIS — E119 Type 2 diabetes mellitus without complications: Secondary | ICD-10-CM | POA: Diagnosis not present

## 2017-09-06 DIAGNOSIS — Z1211 Encounter for screening for malignant neoplasm of colon: Secondary | ICD-10-CM | POA: Diagnosis not present

## 2017-09-06 DIAGNOSIS — Z0000 Encounter for general adult medical examination without abnormal findings: Secondary | ICD-10-CM | POA: Diagnosis not present

## 2017-09-06 DIAGNOSIS — I679 Cerebrovascular disease, unspecified: Secondary | ICD-10-CM | POA: Diagnosis not present

## 2017-09-06 DIAGNOSIS — E7800 Pure hypercholesterolemia, unspecified: Secondary | ICD-10-CM | POA: Diagnosis not present

## 2017-09-06 DIAGNOSIS — I10 Essential (primary) hypertension: Secondary | ICD-10-CM | POA: Diagnosis not present

## 2017-09-25 DIAGNOSIS — Z1211 Encounter for screening for malignant neoplasm of colon: Secondary | ICD-10-CM | POA: Diagnosis not present

## 2018-01-18 DIAGNOSIS — H40013 Open angle with borderline findings, low risk, bilateral: Secondary | ICD-10-CM | POA: Diagnosis not present

## 2018-01-18 DIAGNOSIS — D3131 Benign neoplasm of right choroid: Secondary | ICD-10-CM | POA: Diagnosis not present

## 2018-01-18 DIAGNOSIS — H04123 Dry eye syndrome of bilateral lacrimal glands: Secondary | ICD-10-CM | POA: Diagnosis not present

## 2018-01-18 DIAGNOSIS — H10413 Chronic giant papillary conjunctivitis, bilateral: Secondary | ICD-10-CM | POA: Diagnosis not present

## 2018-02-27 DIAGNOSIS — H903 Sensorineural hearing loss, bilateral: Secondary | ICD-10-CM

## 2018-02-27 DIAGNOSIS — J302 Other seasonal allergic rhinitis: Secondary | ICD-10-CM | POA: Diagnosis not present

## 2018-02-27 DIAGNOSIS — Z974 Presence of external hearing-aid: Secondary | ICD-10-CM | POA: Diagnosis not present

## 2018-02-27 DIAGNOSIS — Z7289 Other problems related to lifestyle: Secondary | ICD-10-CM | POA: Diagnosis not present

## 2018-02-27 DIAGNOSIS — H6123 Impacted cerumen, bilateral: Secondary | ICD-10-CM | POA: Diagnosis not present

## 2018-02-27 HISTORY — DX: Sensorineural hearing loss, bilateral: H90.3

## 2018-03-09 DIAGNOSIS — E119 Type 2 diabetes mellitus without complications: Secondary | ICD-10-CM | POA: Diagnosis not present

## 2018-03-09 DIAGNOSIS — E7800 Pure hypercholesterolemia, unspecified: Secondary | ICD-10-CM | POA: Diagnosis not present

## 2018-03-09 DIAGNOSIS — I679 Cerebrovascular disease, unspecified: Secondary | ICD-10-CM | POA: Diagnosis not present

## 2018-03-09 DIAGNOSIS — I10 Essential (primary) hypertension: Secondary | ICD-10-CM | POA: Diagnosis not present

## 2018-03-16 DIAGNOSIS — Z23 Encounter for immunization: Secondary | ICD-10-CM | POA: Diagnosis not present

## 2018-03-21 DIAGNOSIS — H903 Sensorineural hearing loss, bilateral: Secondary | ICD-10-CM | POA: Diagnosis not present

## 2018-04-06 DIAGNOSIS — L853 Xerosis cutis: Secondary | ICD-10-CM | POA: Diagnosis not present

## 2018-07-12 DIAGNOSIS — H04123 Dry eye syndrome of bilateral lacrimal glands: Secondary | ICD-10-CM | POA: Diagnosis not present

## 2018-07-12 DIAGNOSIS — H40013 Open angle with borderline findings, low risk, bilateral: Secondary | ICD-10-CM | POA: Diagnosis not present

## 2018-07-12 DIAGNOSIS — H10413 Chronic giant papillary conjunctivitis, bilateral: Secondary | ICD-10-CM | POA: Diagnosis not present

## 2018-07-12 DIAGNOSIS — D3131 Benign neoplasm of right choroid: Secondary | ICD-10-CM | POA: Diagnosis not present

## 2018-09-13 DIAGNOSIS — E7800 Pure hypercholesterolemia, unspecified: Secondary | ICD-10-CM | POA: Diagnosis not present

## 2018-09-13 DIAGNOSIS — I679 Cerebrovascular disease, unspecified: Secondary | ICD-10-CM | POA: Diagnosis not present

## 2018-09-13 DIAGNOSIS — I10 Essential (primary) hypertension: Secondary | ICD-10-CM | POA: Diagnosis not present

## 2018-09-13 DIAGNOSIS — E119 Type 2 diabetes mellitus without complications: Secondary | ICD-10-CM | POA: Diagnosis not present

## 2018-10-17 DIAGNOSIS — E119 Type 2 diabetes mellitus without complications: Secondary | ICD-10-CM | POA: Diagnosis not present

## 2018-10-17 DIAGNOSIS — I10 Essential (primary) hypertension: Secondary | ICD-10-CM | POA: Diagnosis not present

## 2018-10-17 DIAGNOSIS — E7800 Pure hypercholesterolemia, unspecified: Secondary | ICD-10-CM | POA: Diagnosis not present

## 2018-10-17 DIAGNOSIS — I679 Cerebrovascular disease, unspecified: Secondary | ICD-10-CM | POA: Diagnosis not present

## 2018-11-26 DIAGNOSIS — H6123 Impacted cerumen, bilateral: Secondary | ICD-10-CM | POA: Diagnosis not present

## 2018-11-26 DIAGNOSIS — Z974 Presence of external hearing-aid: Secondary | ICD-10-CM | POA: Diagnosis not present

## 2018-11-26 DIAGNOSIS — H903 Sensorineural hearing loss, bilateral: Secondary | ICD-10-CM | POA: Diagnosis not present

## 2018-12-09 ENCOUNTER — Other Ambulatory Visit: Payer: Self-pay

## 2018-12-09 ENCOUNTER — Emergency Department: Payer: Medicare Other

## 2018-12-09 ENCOUNTER — Emergency Department (HOSPITAL_BASED_OUTPATIENT_CLINIC_OR_DEPARTMENT_OTHER)
Admission: EM | Admit: 2018-12-09 | Discharge: 2018-12-09 | Disposition: A | Discharge status: Home / Self Care | Payer: Medicare Other | Attending: Emergency Medicine | Admitting: Emergency Medicine

## 2018-12-09 ENCOUNTER — Encounter (HOSPITAL_BASED_OUTPATIENT_CLINIC_OR_DEPARTMENT_OTHER): Payer: Self-pay | Admitting: Emergency Medicine

## 2018-12-09 ENCOUNTER — Emergency Department (HOSPITAL_BASED_OUTPATIENT_CLINIC_OR_DEPARTMENT_OTHER): Payer: Medicare Other

## 2018-12-09 DIAGNOSIS — I10 Essential (primary) hypertension: Secondary | ICD-10-CM | POA: Diagnosis not present

## 2018-12-09 DIAGNOSIS — Y939 Activity, unspecified: Secondary | ICD-10-CM | POA: Insufficient documentation

## 2018-12-09 DIAGNOSIS — Z79899 Other long term (current) drug therapy: Secondary | ICD-10-CM | POA: Insufficient documentation

## 2018-12-09 DIAGNOSIS — W19XXXA Unspecified fall, initial encounter: Secondary | ICD-10-CM

## 2018-12-09 DIAGNOSIS — W06XXXA Fall from bed, initial encounter: Secondary | ICD-10-CM | POA: Diagnosis not present

## 2018-12-09 DIAGNOSIS — Y929 Unspecified place or not applicable: Secondary | ICD-10-CM | POA: Diagnosis not present

## 2018-12-09 DIAGNOSIS — S0990XA Unspecified injury of head, initial encounter: Secondary | ICD-10-CM

## 2018-12-09 DIAGNOSIS — Z7982 Long term (current) use of aspirin: Secondary | ICD-10-CM | POA: Diagnosis not present

## 2018-12-09 DIAGNOSIS — E119 Type 2 diabetes mellitus without complications: Secondary | ICD-10-CM | POA: Insufficient documentation

## 2018-12-09 DIAGNOSIS — Y999 Unspecified external cause status: Secondary | ICD-10-CM | POA: Diagnosis not present

## 2018-12-09 DIAGNOSIS — S060X0A Concussion without loss of consciousness, initial encounter: Secondary | ICD-10-CM | POA: Diagnosis not present

## 2018-12-09 DIAGNOSIS — Z8673 Personal history of transient ischemic attack (TIA), and cerebral infarction without residual deficits: Secondary | ICD-10-CM | POA: Insufficient documentation

## 2018-12-09 DIAGNOSIS — R51 Headache: Secondary | ICD-10-CM | POA: Diagnosis not present

## 2018-12-09 DIAGNOSIS — S299XXA Unspecified injury of thorax, initial encounter: Secondary | ICD-10-CM | POA: Diagnosis not present

## 2018-12-09 NOTE — ED Notes (Signed)
ED Provider at bedside. 

## 2018-12-09 NOTE — ED Triage Notes (Signed)
Pt fell out of bed. C/o head pain. No blood thinners.

## 2018-12-09 NOTE — ED Notes (Signed)
Family at bedside. 

## 2018-12-09 NOTE — ED Provider Notes (Signed)
[facility] EMERGENCY DEPARTMENT Provider Note   CSN: [id number] Arrival date & time: 12/09/18  1051    History   Chief Complaint Chief Complaint  Patient presents with  . Fall    HPI Raymond Fowler is a 85 y.o. male.     HPI   85yo male with history of CVA, DM, htn, who presents with concern for headache after rolling out of bed last night. Reports he rolled out of bed around 3AM and hit his head. Went back to bed but when he woke up had pain to the back of his head, is moderate. No n/v/numbness/weakness/visual changes/trouble walking. Denies neck pain or other injuries.  Reports some dizziness when he first woke up and has had some when he moves certain ways.   Past Medical History:  Diagnosis Date  . Cerebrovascular disease 11/04/2016  . Diabetes mellitus without complication ([place])   . Hypertension   . Stroke [facility])     Patient Active Problem List   Diagnosis Date Noted  . Cerebrovascular disease 11/04/2016  . Dizziness and giddiness 11/04/2016    Past Surgical History:  Procedure Laterality Date  . APPENDECTOMY    . CHOLECYSTECTOMY    . EYE SURGERY    . FRACTURE SURGERY    . HERNIA REPAIR    . TONSILLECTOMY          Home Medications    Prior to Admission medications   Medication Sig Start Date End Date Taking? Authorizing Provider  Ascorbic Acid (VITAMIN C) 1000 MG tablet Take 1,000 mg by mouth daily.   Yes [provider]  aspirin EC 325 MG tablet Take 325 mg by mouth daily.   Yes [provider]  atorvastatin (LIPITOR) 20 MG tablet Take 20 mg by mouth every evening. 07/18/15  Yes [provider]  brimonidine (ALPHAGAN) 0.2 % ophthalmic solution Place 1 drop into both eyes 2 (two) times daily. 05/29/15  Yes [provider]  cycloSPORINE (RESTASIS) 0.05 % ophthalmic emulsion Place 1 drop into both eyes 2 (two) times daily.   Yes [provider]  lisinopril (PRINIVIL,ZESTRIL) 10 MG tablet Take 10 mg by  mouth daily. 07/18/15  Yes [provider]  Carboxymethylcellul-Glycerin (OPTIVE) 0.5-0.9 % SOLN Place 1 drop into both eyes 2 (two) times daily.    [provider]  Chromium Picolinate 1000 MCG TABS Take 1,000 mg by mouth daily.    [provider]  meclizine (ANTIVERT) 25 MG tablet Take 1 tablet (25 mg total) by mouth 3 (three) times daily as needed for dizziness. 10/31/16   Pattricia Boss, MD  Multiple Vitamins-Minerals (MULTIVITAMIN WITH MINERALS) tablet Take 1 tablet by mouth daily.    [provider]  Omega-3 Fatty Acids (FISH OIL) 1200 MG CAPS Take 1,200 mg by mouth 2 (two) times daily.    [provider]    Family History Family History  Problem Relation Age of Onset  . Diabetes Sister     Social History Social History   Tobacco Use  . Smoking status: Never Smoker  . Smokeless tobacco: Never Used  Substance Use Topics  . Alcohol use: Yes    Comment: Occaisionally   . Drug use: No     Allergies   Patient has no known allergies.   Review of Systems Review of Systems  Constitutional: Negative for fever.  Eyes: Negative for visual disturbance.  Respiratory: Negative for shortness of breath.   Cardiovascular: Negative for chest pain.  Gastrointestinal: Negative for abdominal  pain, nausea and vomiting.  Genitourinary: Negative for difficulty urinating.  Musculoskeletal: Negative for back pain, neck pain and neck stiffness.  Skin: Negative for rash.  Neurological: Positive for dizziness and headaches. Negative for syncope, facial asymmetry, weakness and numbness.     Physical Exam Updated Vital Signs BP (!) 156/84 (BP Location: Right Arm)   Pulse 75   Temp 98.4 F (36.9 C) (Oral)   Resp 20   Ht 5\' 10"  (1.778 m)   Wt 59 kg   SpO2 100%   BMI 18.65 kg/m   Physical Exam Vitals signs and nursing note reviewed.  Constitutional:      General: He is not in acute distress.    Appearance: He is well-developed. He is not  diaphoretic.  HENT:     Head: Normocephalic and atraumatic.  Eyes:     Conjunctiva/sclera: Conjunctivae normal.  Neck:     Musculoskeletal: Normal range of motion.  Cardiovascular:     Rate and Rhythm: Normal rate and regular rhythm.     Heart sounds: Normal heart sounds.  Pulmonary:     Effort: Pulmonary effort is normal. No respiratory distress.  Abdominal:     General: There is no distension.     Palpations: Abdomen is soft.     Tenderness: There is no abdominal tenderness. There is no guarding.  Musculoskeletal:     Cervical back: He exhibits no bony tenderness.     Thoracic back: He exhibits no bony tenderness.     Lumbar back: He exhibits no bony tenderness.  Skin:    General: Skin is warm and dry.  Neurological:     Mental Status: He is alert and oriented to person, place, and time.     GCS: GCS eye subscore is 4. GCS verbal subscore is 5. GCS motor subscore is 6.     Cranial Nerves: No cranial nerve deficit, dysarthria or facial asymmetry.     Sensory: Sensation is intact. No sensory deficit.     Motor: Motor function is intact. No weakness.     Coordination: Finger-Nose-Finger Test normal.      ED Treatments / Results  Labs (all labs ordered are listed, but only abnormal results are displayed) Labs Reviewed - No data to display  EKG None  Radiology Ct Head Wo Contrast  Result Date: 12/09/2018 CLINICAL DATA:  Pain following fall EXAM: CT HEAD WITHOUT CONTRAST TECHNIQUE: Contiguous axial images were obtained from the base of the skull through the vertex without intravenous contrast. COMPARISON:  October 31, 2016. FINDINGS: Brain: Mild diffuse atrophy is stable. There is no intracranial mass, hemorrhage, extra-axial fluid collection, or midline shift. There is small vessel disease throughout the centra semiovale bilaterally. There is small vessel disease in the anterior limb of each external capsule. No acute infarct is evident on this study. Vascular: There is no  hyperdense vessel. There are foci of calcification in each distal vertebral artery and carotid siphon region. Skull: The bony calvarium appears intact. Sinuses/Orbits: There is a retention cyst in the anterior left sphenoid sinus. There is mucosal thickening opacification in several ethmoid air cells. Other visualized paranasal sinuses are clear. Orbits appear symmetric bilaterally. Other: Mastoid air cells are clear. IMPRESSION: Atrophy with extensive supratentorial small vessel disease, stable. No acute infarct. No mass or hemorrhage. There are foci of arterial vascular calcification. There are foci of paranasal sinus disease. Electronically Signed   By: Lowella Grip III M.D.   On: 12/09/2018 11:36    Procedures Procedures (including  critical care time)  Medications Ordered in ED Medications - No data to display   Initial Impression / Assessment and Plan / ED Course  I have reviewed the triage vital signs and the nursing notes.  Pertinent labs & imaging results that were available during my care of the patient were reviewed by me and considered in my medical decision making (see chart for details).        85yo male with history of CVA, DM, htn, who presents with concern for headache after rolling out of bed last night. No other injuries or acute illness symptoms.  CT head without signs of bleeding. No neck pain, numbness, weakness, doubt CSpine injury.  Normal coordination, denies problems ambulating, normal EOM, no other neurologic symptoms and doubt CVA.  Given headache and hx of head trauma reasonable to treat as concussion. Recommend supportive care.  Patient discharged in stable condition with understanding of reasons to return.   Final Clinical Impressions(s) / ED Diagnoses   Final diagnoses:  Fall, initial encounter  Injury of head, initial encounter  Concussion without loss of consciousness, initial encounter    ED Discharge Orders    None       Gareth Morgan, MD  12/09/18 2226

## 2019-01-17 DIAGNOSIS — H40013 Open angle with borderline findings, low risk, bilateral: Secondary | ICD-10-CM | POA: Diagnosis not present

## 2019-01-17 DIAGNOSIS — H10413 Chronic giant papillary conjunctivitis, bilateral: Secondary | ICD-10-CM | POA: Diagnosis not present

## 2019-01-17 DIAGNOSIS — D3131 Benign neoplasm of right choroid: Secondary | ICD-10-CM | POA: Diagnosis not present

## 2019-01-17 DIAGNOSIS — H04123 Dry eye syndrome of bilateral lacrimal glands: Secondary | ICD-10-CM | POA: Diagnosis not present

## 2019-01-17 DIAGNOSIS — H524 Presbyopia: Secondary | ICD-10-CM | POA: Diagnosis not present

## 2019-02-04 DIAGNOSIS — Z23 Encounter for immunization: Secondary | ICD-10-CM | POA: Diagnosis not present

## 2019-03-08 DIAGNOSIS — E119 Type 2 diabetes mellitus without complications: Secondary | ICD-10-CM | POA: Diagnosis not present

## 2019-03-08 DIAGNOSIS — E7800 Pure hypercholesterolemia, unspecified: Secondary | ICD-10-CM | POA: Diagnosis not present

## 2019-03-08 DIAGNOSIS — E46 Unspecified protein-calorie malnutrition: Secondary | ICD-10-CM | POA: Diagnosis not present

## 2019-03-08 DIAGNOSIS — Z0000 Encounter for general adult medical examination without abnormal findings: Secondary | ICD-10-CM | POA: Diagnosis not present

## 2019-03-08 DIAGNOSIS — I10 Essential (primary) hypertension: Secondary | ICD-10-CM | POA: Diagnosis not present

## 2019-03-08 DIAGNOSIS — I679 Cerebrovascular disease, unspecified: Secondary | ICD-10-CM | POA: Diagnosis not present

## 2019-06-11 DIAGNOSIS — H6123 Impacted cerumen, bilateral: Secondary | ICD-10-CM | POA: Diagnosis not present

## 2019-06-11 DIAGNOSIS — Z974 Presence of external hearing-aid: Secondary | ICD-10-CM | POA: Diagnosis not present

## 2019-06-11 DIAGNOSIS — H903 Sensorineural hearing loss, bilateral: Secondary | ICD-10-CM | POA: Diagnosis not present

## 2019-07-14 ENCOUNTER — Ambulatory Visit: Payer: Medicare Other | Attending: Internal Medicine

## 2019-07-14 DIAGNOSIS — Z23 Encounter for immunization: Secondary | ICD-10-CM | POA: Insufficient documentation

## 2019-07-14 NOTE — Progress Notes (Signed)
   Covid-19 Vaccination Clinic  Name:  [name]    MRN: [id number] DOB: 30-Jun-1932  07/14/2019  [name] was observed post Covid-19 immunization for 15 minutes without incidence. He was provided with Vaccine Information Sheet and instruction to access the V-Safe system.   [name] was instructed to call 911 with any severe reactions post vaccine: Marland Kitchen Difficulty breathing  . Swelling of your face and throat  . A fast heartbeat  . A bad rash all over your body  . Dizziness and weakness    Immunizations Administered    Name Date Dose VIS Date Route   Pfizer COVID-19 Vaccine 07/14/2019  1:48 PM 0.3 mL 05/10/2019 Intramuscular   Manufacturer: [place]   Lot: [id number]   [place]: [id number]

## 2019-07-25 DIAGNOSIS — D3131 Benign neoplasm of right choroid: Secondary | ICD-10-CM | POA: Diagnosis not present

## 2019-07-25 DIAGNOSIS — H40013 Open angle with borderline findings, low risk, bilateral: Secondary | ICD-10-CM | POA: Diagnosis not present

## 2019-07-25 DIAGNOSIS — H04123 Dry eye syndrome of bilateral lacrimal glands: Secondary | ICD-10-CM | POA: Diagnosis not present

## 2019-08-06 ENCOUNTER — Ambulatory Visit: Payer: Medicare Other | Attending: Internal Medicine

## 2019-08-06 ENCOUNTER — Ambulatory Visit: Payer: No Typology Code available for payment source

## 2019-08-06 DIAGNOSIS — Z23 Encounter for immunization: Secondary | ICD-10-CM | POA: Insufficient documentation

## 2019-08-06 NOTE — Progress Notes (Signed)
   Covid-19 Vaccination Clinic  Name:  Raymond Fowler    MRN: [id number] DOB: 1932/06/21  08/06/2019  Mr. Raymond Fowler was observed post Covid-19 immunization for 15 minutes without incident. He was provided with Vaccine Information Sheet and instruction to access the V-Safe system.   Mr. Raymond Fowler was instructed to call 911 with any severe reactions post vaccine: Marland Kitchen Difficulty breathing  . Swelling of face and throat  . A fast heartbeat  . A bad rash all over body  . Dizziness and weakness   Immunizations Administered    Name Date Dose VIS Date Route   Pfizer COVID-19 Vaccine 08/06/2019  2:42 PM 0.3 mL 05/10/2019 Intramuscular   Manufacturer: [place]   Lot: [id number]   [place]: [id number]

## 2019-08-07 ENCOUNTER — Ambulatory Visit: Payer: No Typology Code available for payment source

## 2019-09-06 DIAGNOSIS — I10 Essential (primary) hypertension: Secondary | ICD-10-CM | POA: Diagnosis not present

## 2019-09-06 DIAGNOSIS — E1159 Type 2 diabetes mellitus with other circulatory complications: Secondary | ICD-10-CM | POA: Diagnosis not present

## 2019-09-06 DIAGNOSIS — I679 Cerebrovascular disease, unspecified: Secondary | ICD-10-CM | POA: Diagnosis not present

## 2019-09-06 DIAGNOSIS — E7800 Pure hypercholesterolemia, unspecified: Secondary | ICD-10-CM | POA: Diagnosis not present

## 2019-09-30 DIAGNOSIS — H6123 Impacted cerumen, bilateral: Secondary | ICD-10-CM | POA: Diagnosis not present

## 2019-09-30 DIAGNOSIS — H903 Sensorineural hearing loss, bilateral: Secondary | ICD-10-CM | POA: Diagnosis not present

## 2019-09-30 DIAGNOSIS — Z974 Presence of external hearing-aid: Secondary | ICD-10-CM | POA: Diagnosis not present

## 2019-10-17 DIAGNOSIS — H903 Sensorineural hearing loss, bilateral: Secondary | ICD-10-CM | POA: Diagnosis not present

## 2019-10-17 DIAGNOSIS — Z01118 Encounter for examination of ears and hearing with other abnormal findings: Secondary | ICD-10-CM | POA: Diagnosis not present

## 2019-10-17 DIAGNOSIS — Z974 Presence of external hearing-aid: Secondary | ICD-10-CM | POA: Diagnosis not present

## 2019-12-31 DIAGNOSIS — H903 Sensorineural hearing loss, bilateral: Secondary | ICD-10-CM | POA: Diagnosis not present

## 2019-12-31 DIAGNOSIS — H6123 Impacted cerumen, bilateral: Secondary | ICD-10-CM | POA: Diagnosis not present

## 2019-12-31 DIAGNOSIS — Z974 Presence of external hearing-aid: Secondary | ICD-10-CM | POA: Diagnosis not present

## 2020-01-09 DIAGNOSIS — Z974 Presence of external hearing-aid: Secondary | ICD-10-CM | POA: Diagnosis not present

## 2020-01-09 DIAGNOSIS — H903 Sensorineural hearing loss, bilateral: Secondary | ICD-10-CM | POA: Diagnosis not present

## 2020-01-09 DIAGNOSIS — Z8669 Personal history of other diseases of the nervous system and sense organs: Secondary | ICD-10-CM | POA: Diagnosis not present

## 2020-01-17 DIAGNOSIS — Z23 Encounter for immunization: Secondary | ICD-10-CM | POA: Diagnosis not present

## 2020-02-04 DIAGNOSIS — H903 Sensorineural hearing loss, bilateral: Secondary | ICD-10-CM | POA: Diagnosis not present

## 2020-02-10 DIAGNOSIS — Z45321 Encounter for adjustment and management of cochlear device: Secondary | ICD-10-CM | POA: Diagnosis not present

## 2020-02-10 DIAGNOSIS — H903 Sensorineural hearing loss, bilateral: Secondary | ICD-10-CM | POA: Diagnosis not present

## 2020-02-24 DIAGNOSIS — Z9621 Cochlear implant status: Secondary | ICD-10-CM | POA: Insufficient documentation

## 2020-02-24 DIAGNOSIS — Z48810 Encounter for surgical aftercare following surgery on the sense organs: Secondary | ICD-10-CM | POA: Diagnosis not present

## 2020-02-24 DIAGNOSIS — H903 Sensorineural hearing loss, bilateral: Secondary | ICD-10-CM | POA: Diagnosis not present

## 2020-02-24 HISTORY — DX: Cochlear implant status: Z96.21

## 2020-02-28 DIAGNOSIS — E1159 Type 2 diabetes mellitus with other circulatory complications: Secondary | ICD-10-CM | POA: Diagnosis not present

## 2020-02-28 DIAGNOSIS — E7800 Pure hypercholesterolemia, unspecified: Secondary | ICD-10-CM | POA: Diagnosis not present

## 2020-02-28 DIAGNOSIS — I10 Essential (primary) hypertension: Secondary | ICD-10-CM | POA: Diagnosis not present

## 2020-02-28 DIAGNOSIS — Z1211 Encounter for screening for malignant neoplasm of colon: Secondary | ICD-10-CM | POA: Diagnosis not present

## 2020-02-28 DIAGNOSIS — Z0000 Encounter for general adult medical examination without abnormal findings: Secondary | ICD-10-CM | POA: Diagnosis not present

## 2020-03-04 DIAGNOSIS — Z1211 Encounter for screening for malignant neoplasm of colon: Secondary | ICD-10-CM | POA: Diagnosis not present

## 2020-03-10 DIAGNOSIS — Z9889 Other specified postprocedural states: Secondary | ICD-10-CM | POA: Diagnosis not present

## 2020-03-10 DIAGNOSIS — H6123 Impacted cerumen, bilateral: Secondary | ICD-10-CM | POA: Diagnosis not present

## 2020-03-10 DIAGNOSIS — Z9621 Cochlear implant status: Secondary | ICD-10-CM | POA: Diagnosis not present

## 2020-03-10 DIAGNOSIS — H903 Sensorineural hearing loss, bilateral: Secondary | ICD-10-CM | POA: Diagnosis not present

## 2020-03-10 DIAGNOSIS — Z974 Presence of external hearing-aid: Secondary | ICD-10-CM | POA: Insufficient documentation

## 2020-03-10 DIAGNOSIS — H6122 Impacted cerumen, left ear: Secondary | ICD-10-CM | POA: Diagnosis not present

## 2020-03-10 HISTORY — DX: Presence of external hearing-aid: Z97.4

## 2020-03-12 DIAGNOSIS — Z23 Encounter for immunization: Secondary | ICD-10-CM | POA: Diagnosis not present

## 2020-04-06 DIAGNOSIS — H40013 Open angle with borderline findings, low risk, bilateral: Secondary | ICD-10-CM | POA: Diagnosis not present

## 2020-04-06 DIAGNOSIS — H04123 Dry eye syndrome of bilateral lacrimal glands: Secondary | ICD-10-CM | POA: Diagnosis not present

## 2020-04-06 DIAGNOSIS — D3131 Benign neoplasm of right choroid: Secondary | ICD-10-CM | POA: Diagnosis not present

## 2020-04-06 DIAGNOSIS — H10413 Chronic giant papillary conjunctivitis, bilateral: Secondary | ICD-10-CM | POA: Diagnosis not present

## 2020-04-07 DIAGNOSIS — Z45321 Encounter for adjustment and management of cochlear device: Secondary | ICD-10-CM | POA: Diagnosis not present

## 2020-04-07 DIAGNOSIS — H903 Sensorineural hearing loss, bilateral: Secondary | ICD-10-CM | POA: Diagnosis not present

## 2020-05-28 DIAGNOSIS — H903 Sensorineural hearing loss, bilateral: Secondary | ICD-10-CM | POA: Diagnosis not present

## 2020-05-28 DIAGNOSIS — Z45321 Encounter for adjustment and management of cochlear device: Secondary | ICD-10-CM | POA: Diagnosis not present

## 2020-07-14 DIAGNOSIS — Z45321 Encounter for adjustment and management of cochlear device: Secondary | ICD-10-CM | POA: Diagnosis not present

## 2020-07-31 DIAGNOSIS — H6123 Impacted cerumen, bilateral: Secondary | ICD-10-CM | POA: Diagnosis not present

## 2020-07-31 DIAGNOSIS — H903 Sensorineural hearing loss, bilateral: Secondary | ICD-10-CM | POA: Diagnosis not present

## 2020-09-02 DIAGNOSIS — E7800 Pure hypercholesterolemia, unspecified: Secondary | ICD-10-CM | POA: Diagnosis not present

## 2020-09-02 DIAGNOSIS — E1159 Type 2 diabetes mellitus with other circulatory complications: Secondary | ICD-10-CM | POA: Diagnosis not present

## 2020-09-02 DIAGNOSIS — I10 Essential (primary) hypertension: Secondary | ICD-10-CM | POA: Diagnosis not present

## 2020-09-02 DIAGNOSIS — Z681 Body mass index (BMI) 19 or less, adult: Secondary | ICD-10-CM | POA: Diagnosis not present

## 2020-10-12 DIAGNOSIS — H40023 Open angle with borderline findings, high risk, bilateral: Secondary | ICD-10-CM | POA: Diagnosis not present

## 2020-10-12 DIAGNOSIS — H04123 Dry eye syndrome of bilateral lacrimal glands: Secondary | ICD-10-CM | POA: Diagnosis not present

## 2020-10-13 DIAGNOSIS — Z45321 Encounter for adjustment and management of cochlear device: Secondary | ICD-10-CM | POA: Diagnosis not present

## 2020-12-09 DIAGNOSIS — Z23 Encounter for immunization: Secondary | ICD-10-CM | POA: Diagnosis not present

## 2021-01-07 ENCOUNTER — Other Ambulatory Visit: Payer: Self-pay

## 2021-01-07 ENCOUNTER — Encounter (HOSPITAL_BASED_OUTPATIENT_CLINIC_OR_DEPARTMENT_OTHER): Payer: Self-pay

## 2021-01-07 ENCOUNTER — Emergency Department (HOSPITAL_BASED_OUTPATIENT_CLINIC_OR_DEPARTMENT_OTHER): Payer: Medicare Other

## 2021-01-07 ENCOUNTER — Emergency Department (HOSPITAL_BASED_OUTPATIENT_CLINIC_OR_DEPARTMENT_OTHER)
Admission: EM | Admit: 2021-01-07 | Discharge: 2021-01-07 | Disposition: A | Discharge status: Home / Self Care | Payer: Medicare Other | Attending: Emergency Medicine | Admitting: Emergency Medicine

## 2021-01-07 DIAGNOSIS — I10 Essential (primary) hypertension: Secondary | ICD-10-CM | POA: Diagnosis not present

## 2021-01-07 DIAGNOSIS — R42 Dizziness and giddiness: Secondary | ICD-10-CM | POA: Insufficient documentation

## 2021-01-07 DIAGNOSIS — G319 Degenerative disease of nervous system, unspecified: Secondary | ICD-10-CM | POA: Diagnosis not present

## 2021-01-07 DIAGNOSIS — I6529 Occlusion and stenosis of unspecified carotid artery: Secondary | ICD-10-CM | POA: Diagnosis not present

## 2021-01-07 DIAGNOSIS — E119 Type 2 diabetes mellitus without complications: Secondary | ICD-10-CM | POA: Insufficient documentation

## 2021-01-07 DIAGNOSIS — Z7982 Long term (current) use of aspirin: Secondary | ICD-10-CM | POA: Diagnosis not present

## 2021-01-07 DIAGNOSIS — Z79899 Other long term (current) drug therapy: Secondary | ICD-10-CM | POA: Diagnosis not present

## 2021-01-07 DIAGNOSIS — R531 Weakness: Secondary | ICD-10-CM | POA: Diagnosis not present

## 2021-01-07 LAB — COMPREHENSIVE METABOLIC PANEL
ALT: 17 U/L (ref 0–44)
AST: 26 U/L (ref 15–41)
Albumin: 4.3 g/dL (ref 3.5–5.0)
Alkaline Phosphatase: 36 U/L — ABNORMAL LOW (ref 38–126)
Anion gap: 11 (ref 5–15)
BUN: 25 mg/dL — ABNORMAL HIGH (ref 8–23)
CO2: 28 mmol/L (ref 22–32)
Calcium: 9.9 mg/dL (ref 8.9–10.3)
Chloride: 101 mmol/L (ref 98–111)
Creatinine, Ser: 1.18 mg/dL (ref 0.61–1.24)
GFR, Estimated: >60 mL/min (ref >60)
Glucose, Bld: 142 mg/dL — ABNORMAL HIGH (ref 70–99)
Potassium: 3.8 mmol/L (ref 3.5–5.1)
Sodium: 140 mmol/L (ref 135–145)
Total Bilirubin: 0.8 mg/dL (ref 0.3–1.2)
Total Protein: 7.6 g/dL (ref 6.5–8.1)

## 2021-01-07 LAB — URINALYSIS, ROUTINE W REFLEX MICROSCOPIC
Bilirubin Urine: NEGATIVE
Glucose, UA: NEGATIVE mg/dL
Hgb urine dipstick: NEGATIVE
Ketones, ur: NEGATIVE mg/dL
Leukocytes,Ua: NEGATIVE
Nitrite: NEGATIVE
Protein, ur: NEGATIVE mg/dL
Specific Gravity, Urine: <1.005 — ABNORMAL LOW (ref 1.005–1.030)
pH: 6.5 (ref 5.0–8.0)

## 2021-01-07 LAB — CBC WITH DIFFERENTIAL/PLATELET
Abs Immature Granulocytes: 0.02 10*3/uL (ref 0.00–0.07)
Basophils Absolute: 0 10*3/uL (ref 0.0–0.1)
Basophils Relative: 1 %
Eosinophils Absolute: 0.1 10*3/uL (ref 0.0–0.5)
Eosinophils Relative: 2 %
HCT: 39.4 % (ref 39.0–52.0)
Hemoglobin: 13.4 g/dL (ref 13.0–17.0)
Immature Granulocytes: 0 %
Lymphocytes Relative: 25 %
Lymphs Abs: 1.5 10*3/uL (ref 0.7–4.0)
MCH: 30.6 pg (ref 26.0–34.0)
MCHC: 34 g/dL (ref 30.0–36.0)
MCV: 90 fL (ref 80.0–100.0)
Monocytes Absolute: 0.6 10*3/uL (ref 0.1–1.0)
Monocytes Relative: 10 %
Neutro Abs: 3.6 10*3/uL (ref 1.7–7.7)
Neutrophils Relative %: 62 %
Platelets: 188 10*3/uL (ref 150–400)
RBC: 4.38 MIL/uL (ref 4.22–5.81)
RDW: 13.8 % (ref 11.5–15.5)
WBC: 5.8 10*3/uL (ref 4.0–10.5)
nRBC: 0 % (ref 0.0–0.2)

## 2021-01-07 LAB — LIPASE, BLOOD: Lipase: 36 U/L (ref 11–51)

## 2021-01-07 NOTE — ED Triage Notes (Signed)
Per wife pt feeling light headed, decreased appetite x 2 months-pt has not sought medical care for c/o until today-NAD-unsteady gait

## 2021-01-07 NOTE — ED Provider Notes (Signed)
[facility] EMERGENCY DEPARTMENT Provider Note   CSN: [id number] Arrival date & time: 01/07/21  1523     History Chief Complaint  Patient presents with   Dizziness    Raymond Fowler is a 87 y.o. male.  The history is provided by the patient.  Dizziness Quality:  Lightheadedness Severity:  Mild Onset quality:  Gradual Duration:  3 months Timing:  Intermittent Progression:  Waxing and waning Chronicity:  Recurrent Relieved by:  Nothing Worsened by:  Nothing Associated symptoms: no chest pain, no headaches, no nausea, no palpitations, no shortness of breath, no vomiting and no weakness       Past Medical History:  Diagnosis Date   Cerebrovascular disease 11/04/2016   Diabetes mellitus without complication ([place])    Hypertension    Stroke [facility])     Patient Active Problem List   Diagnosis Date Noted   Cerebrovascular disease 11/04/2016   Dizziness and giddiness 11/04/2016    Past Surgical History:  Procedure Laterality Date   APPENDECTOMY     CHOLECYSTECTOMY     COCHLEAR IMPLANT     EYE SURGERY     FRACTURE SURGERY     HERNIA REPAIR     TONSILLECTOMY         Family History  Problem Relation Age of Onset   Diabetes Sister     Social History   Tobacco Use   Smoking status: Never   Smokeless tobacco: Never  Substance Use Topics   Alcohol use: Yes    Comment: rare   Drug use: No    Home Medications Prior to Admission medications   Medication Sig Start Date End Date Taking? Authorizing Provider  Ascorbic Acid (VITAMIN C) 1000 MG tablet Take 1,000 mg by mouth daily.    [provider]  aspirin EC 325 MG tablet Take 325 mg by mouth daily.    [provider]  atorvastatin (LIPITOR) 20 MG tablet Take 20 mg by mouth every evening. 07/18/15   [provider]  brimonidine (ALPHAGAN) 0.2 % ophthalmic solution Place 1 drop into both eyes 2 (two) times daily. 05/29/15   [provider]  Carboxymethylcellul-Glycerin  (OPTIVE) 0.5-0.9 % SOLN Place 1 drop into both eyes 2 (two) times daily.    [provider]  Chromium Picolinate 1000 MCG TABS Take 1,000 mg by mouth daily.    [provider]  cycloSPORINE (RESTASIS) 0.05 % ophthalmic emulsion Place 1 drop into both eyes 2 (two) times daily.    [provider]  lisinopril (PRINIVIL,ZESTRIL) 10 MG tablet Take 10 mg by mouth daily. 07/18/15   [provider]  meclizine (ANTIVERT) 25 MG tablet Take 1 tablet (25 mg total) by mouth 3 (three) times daily as needed for dizziness. 10/31/16   Pattricia Boss, MD  Multiple Vitamins-Minerals (MULTIVITAMIN WITH MINERALS) tablet Take 1 tablet by mouth daily.    [provider]  Omega-3 Fatty Acids (FISH OIL) 1200 MG CAPS Take 1,200 mg by mouth 2 (two) times daily.    [provider]    Allergies    Patient has no known allergies.  Review of Systems   Review of Systems  Constitutional:  Negative for chills and fever.  HENT:  Negative for ear pain and sore throat.   Eyes:  Negative for pain and visual disturbance.  Respiratory:  Negative for cough and shortness of breath.   Cardiovascular:  Negative for chest pain and palpitations.  Gastrointestinal:  Negative for abdominal pain, nausea and vomiting.  Genitourinary:  Negative for dysuria and hematuria.  Musculoskeletal:  Negative for arthralgias and back pain.  Skin:  Negative for color change and rash.  Neurological:  Positive for light-headedness. Negative for dizziness, tremors, seizures, syncope, facial asymmetry, speech difficulty, weakness, numbness and headaches.  All other systems reviewed and are negative.  Physical Exam Updated Vital Signs BP (!) 179/124   Pulse 67   Temp 98.4 F (36.9 C) (Oral)   Resp 15   SpO2 100%   Physical Exam Vitals and nursing note reviewed.  Constitutional:      General: He is not in acute distress.    Appearance: He is well-developed. He is not ill-appearing.  HENT:      Head: Normocephalic and atraumatic.     Nose: Nose normal.     Mouth/Throat:     Mouth: Mucous membranes are moist.  Eyes:     Extraocular Movements: Extraocular movements intact.     Conjunctiva/sclera: Conjunctivae normal.     Pupils: Pupils are equal, round, and reactive to light.  Cardiovascular:     Rate and Rhythm: Normal rate and regular rhythm.     Heart sounds: No murmur heard. Pulmonary:     Effort: Pulmonary effort is normal. No respiratory distress.     Breath sounds: Normal breath sounds.  Abdominal:     Palpations: Abdomen is soft.     Tenderness: There is no abdominal tenderness.  Musculoskeletal:        General: Normal range of motion.     Cervical back: Normal range of motion and neck supple. No tenderness.  Skin:    General: Skin is warm and dry.     Capillary Refill: Capillary refill takes less than 2 seconds.  Neurological:     General: No focal deficit present.     Mental Status: He is alert and oriented to person, place, and time.     Cranial Nerves: No cranial nerve deficit.     Sensory: No sensory deficit.     Motor: No weakness.     Coordination: Coordination normal.     Comments: 5+ out of 5 strength throughout, normal sensation, no drift, normal finger-nose-finger, normal speech, normal visual fields  Psychiatric:        Mood and Affect: Mood normal.    ED Results / Procedures / Treatments   Labs (all labs ordered are listed, but only abnormal results are displayed) Labs Reviewed  COMPREHENSIVE METABOLIC PANEL - Abnormal; Notable for the following components:      Result Value   Glucose, Bld 142 (*)    BUN 25 (*)    Alkaline Phosphatase 36 (*)    All other components within normal limits  URINALYSIS, ROUTINE W REFLEX MICROSCOPIC - Abnormal; Notable for the following components:   Specific Gravity, Urine <1.005 (*)    All other components within normal limits  CBC WITH DIFFERENTIAL/PLATELET  LIPASE, BLOOD    EKG EKG  Interpretation  Date/Time:  Thursday January 07 2021 15:55:58 EDT Ventricular Rate:  74 PR Interval:  162 QRS Duration: 86 QT Interval:  392 QTC Calculation: 435 R Axis:   63 Text Interpretation: Normal sinus rhythm Septal infarct , age undetermined Abnormal ECG Confirmed by Lennice Sites [phone]) on 01/07/2021 3:57:15 PM  Radiology CT HEAD WO CONTRAST (5MM)  Result Date: 01/07/2021 CLINICAL DATA:  Weakness lightheaded EXAM: CT HEAD WITHOUT CONTRAST TECHNIQUE: Contiguous axial images were obtained from the base of the skull through the vertex without intravenous contrast. COMPARISON:  CT 12/09/2018 FINDINGS: Brain: Limited by artifact emanating from left cochlear implant. No acute territorial infarction, hemorrhage or intracranial mass. Atrophy and chronic small vessel ischemic changes of the white matter. Stable ventricle size Vascular: No hyperdense vessels. Vertebral and carotid vascular calcification Skull: No fracture Sinuses/Orbits: Mucosal thickening in the sinuses. Previous mastoid surgery. Other: None IMPRESSION: 1. Slightly limited exam secondary to artifact. 2. No definite CT evidence for acute intracranial abnormality. Atrophy and chronic small vessel ischemic changes of the white matter. Electronically Signed   By: Donavan Foil M.D.   On: 01/07/2021 17:13    Procedures Procedures   Medications Ordered in ED Medications - No data to display  ED Course  I have reviewed the triage vital signs and the nursing notes.  Pertinent labs & imaging results that were available during my care of the patient were reviewed by me and considered in my medical decision making (see chart for details).    MDM Rules/Calculators/A&P                           Marcin Mcelhenney is here with lightheadedness.  Overall unremarkable vitals.  No fever.  Intermittent episodes of lightheadedness over the last several months.  May be positional.  Asymptomatic now.  No weakness, numbness, speech changes, vision  changes.  Illogically he is intact on exam.  He is asymptomatic.  CT scan of his head was unremarkable.  EKG shows sinus rhythm.  No ischemic changes.  No chest pain.  No significant anemia, electrolyte ab, kidney injury.  Urinalysis negative for infection.  Possibly some mild orthostasis or medication side effect.  May be some mild vertigo.  Overall symptoms appear chronic and no concern for acute process including stroke.  Blood pressure mildly elevated here.  Recommend blood pressure journal at home and close follow-up with PCP.  Not having any stroke symptoms or chest pain.  Recommend close follow-up with primary care doctor.  Discharged in ED in good condition.  Understands return precautions.  This chart was dictated using voice recognition software.  Despite best efforts to proofread,  errors can occur which can change the documentation meaning.   Final Clinical Impression(s) / ED Diagnoses Final diagnoses:  Lightheadedness    Rx / DC Orders ED Discharge Orders     None        Lennice Sites, DO 01/07/21 2115

## 2021-01-07 NOTE — Discharge Instructions (Addendum)
Recommend keeping blood pressure journal.  Write blood pressure down twice a day and follow-up with your primary care doctor.  Please return if symptoms worsen.

## 2021-01-18 DIAGNOSIS — R42 Dizziness and giddiness: Secondary | ICD-10-CM | POA: Diagnosis not present

## 2021-01-18 DIAGNOSIS — I10 Essential (primary) hypertension: Secondary | ICD-10-CM | POA: Diagnosis not present

## 2021-02-09 DIAGNOSIS — Z9621 Cochlear implant status: Secondary | ICD-10-CM | POA: Diagnosis not present

## 2021-02-09 DIAGNOSIS — H6123 Impacted cerumen, bilateral: Secondary | ICD-10-CM | POA: Diagnosis not present

## 2021-02-09 DIAGNOSIS — H903 Sensorineural hearing loss, bilateral: Secondary | ICD-10-CM | POA: Diagnosis not present

## 2021-02-18 ENCOUNTER — Emergency Department (HOSPITAL_BASED_OUTPATIENT_CLINIC_OR_DEPARTMENT_OTHER): Payer: Medicare Other

## 2021-02-18 ENCOUNTER — Other Ambulatory Visit: Payer: Self-pay

## 2021-02-18 ENCOUNTER — Emergency Department (HOSPITAL_BASED_OUTPATIENT_CLINIC_OR_DEPARTMENT_OTHER)
Admission: EM | Admit: 2021-02-18 | Discharge: 2021-02-19 | Disposition: A | Discharge status: Home / Self Care | Payer: Medicare Other | Attending: Emergency Medicine | Admitting: Emergency Medicine

## 2021-02-18 ENCOUNTER — Encounter (HOSPITAL_BASED_OUTPATIENT_CLINIC_OR_DEPARTMENT_OTHER): Payer: Self-pay

## 2021-02-18 DIAGNOSIS — R109 Unspecified abdominal pain: Secondary | ICD-10-CM | POA: Insufficient documentation

## 2021-02-18 DIAGNOSIS — R296 Repeated falls: Secondary | ICD-10-CM | POA: Diagnosis not present

## 2021-02-18 DIAGNOSIS — I10 Essential (primary) hypertension: Secondary | ICD-10-CM | POA: Insufficient documentation

## 2021-02-18 DIAGNOSIS — Y9301 Activity, walking, marching and hiking: Secondary | ICD-10-CM | POA: Insufficient documentation

## 2021-02-18 DIAGNOSIS — Z79899 Other long term (current) drug therapy: Secondary | ICD-10-CM | POA: Insufficient documentation

## 2021-02-18 DIAGNOSIS — S59909A Unspecified injury of unspecified elbow, initial encounter: Secondary | ICD-10-CM | POA: Diagnosis not present

## 2021-02-18 DIAGNOSIS — W01198A Fall on same level from slipping, tripping and stumbling with subsequent striking against other object, initial encounter: Secondary | ICD-10-CM | POA: Insufficient documentation

## 2021-02-18 DIAGNOSIS — Z043 Encounter for examination and observation following other accident: Secondary | ICD-10-CM | POA: Diagnosis not present

## 2021-02-18 DIAGNOSIS — W19XXXA Unspecified fall, initial encounter: Secondary | ICD-10-CM

## 2021-02-18 DIAGNOSIS — S22050A Wedge compression fracture of T5-T6 vertebra, initial encounter for closed fracture: Secondary | ICD-10-CM | POA: Insufficient documentation

## 2021-02-18 DIAGNOSIS — R000 Tachycardia, unspecified: Secondary | ICD-10-CM | POA: Insufficient documentation

## 2021-02-18 DIAGNOSIS — J3489 Other specified disorders of nose and nasal sinuses: Secondary | ICD-10-CM | POA: Diagnosis not present

## 2021-02-18 DIAGNOSIS — S3991XA Unspecified injury of abdomen, initial encounter: Secondary | ICD-10-CM | POA: Diagnosis not present

## 2021-02-18 DIAGNOSIS — R42 Dizziness and giddiness: Secondary | ICD-10-CM | POA: Diagnosis not present

## 2021-02-18 DIAGNOSIS — E119 Type 2 diabetes mellitus without complications: Secondary | ICD-10-CM | POA: Diagnosis not present

## 2021-02-18 DIAGNOSIS — I2510 Atherosclerotic heart disease of native coronary artery without angina pectoris: Secondary | ICD-10-CM | POA: Diagnosis not present

## 2021-02-18 DIAGNOSIS — S199XXA Unspecified injury of neck, initial encounter: Secondary | ICD-10-CM | POA: Diagnosis not present

## 2021-02-18 DIAGNOSIS — N1330 Unspecified hydronephrosis: Secondary | ICD-10-CM | POA: Diagnosis not present

## 2021-02-18 DIAGNOSIS — S299XXA Unspecified injury of thorax, initial encounter: Secondary | ICD-10-CM | POA: Diagnosis not present

## 2021-02-18 DIAGNOSIS — S0990XA Unspecified injury of head, initial encounter: Secondary | ICD-10-CM | POA: Diagnosis not present

## 2021-02-18 DIAGNOSIS — M545 Low back pain, unspecified: Secondary | ICD-10-CM | POA: Diagnosis not present

## 2021-02-18 NOTE — ED Triage Notes (Addendum)
Pt brought to ED via [facility]-pt with falls x 2 today-abrasion to right elbow-to triage in w/c-NAD-no pain to elbow or forearm on palpation-pt states he is having mid/lower back pain

## 2021-02-18 NOTE — ED Provider Notes (Signed)
[facility]PART[facility] Provider Note   CSN: [id number] Arrival date & time: 02/18/21  1848     History Chief Complaint  Patient presents with   Raymond Fowler is a 87 y.o. male.  The history is provided by the patient, the spouse and medical records.  Fall Raymond Fowler is a 87 y.o. male who presents to the Emergency Department complaining of multiple falls.  He presents to the ED accompanied by his wife by EMS for evaluation of multiple falls. Before today his last fall was in may.  Level V caveat due to patient being very hard of hearing.  Hx is provided by patient and wife.  He lives at home with his wife and is normally very active and goes on long walks. Today he went on a walk and he had to falls. He states he got lightheaded and fell backwards, striking his head and back. He was able to get himself back up after the falls. His wife then went out to assist him. He then fell a third time and caused her to fall as well. He currently complains of pain to his back around his shoulder blades as well as pain wrapping around his abdomen. No reports of recent illnesses. He does bring a cane with him on his walks in case he needs it but normally walks without assistance. He has a history of hypertension, cochlear implant. He is not on anticoagulation.     Past Medical History:  Diagnosis Date   Cerebrovascular disease 11/04/2016   Diabetes mellitus without complication ([facility])    Hypertension    Stroke [facility])     Patient Active Problem List   Diagnosis Date Noted   Cerebrovascular disease 11/04/2016   Dizziness and giddiness 11/04/2016    Past Surgical History:  Procedure Laterality Date   APPENDECTOMY     CHOLECYSTECTOMY     COCHLEAR IMPLANT     EYE SURGERY     FRACTURE SURGERY     HERNIA REPAIR     TONSILLECTOMY         Family History  Problem Relation Age of Onset   Diabetes Sister     Social History   Tobacco Use   Smoking status: Never    Smokeless tobacco: Never  Substance Use Topics   Alcohol use: Yes    Comment: rare   Drug use: No    Home Medications Prior to Admission medications   Medication Sig Start Date End Date Taking? Authorizing Provider  Ascorbic Acid (VITAMIN C) 1000 MG tablet Take 1,000 mg by mouth daily.    [provider]  aspirin EC 325 MG tablet Take 325 mg by mouth daily.    [provider]  atorvastatin (LIPITOR) 20 MG tablet Take 20 mg by mouth every evening. 07/18/15   [provider]  brimonidine (ALPHAGAN) 0.2 % ophthalmic solution Place 1 drop into both eyes 2 (two) times daily. 05/29/15   [provider]  Carboxymethylcellul-Glycerin (OPTIVE) 0.5-0.9 % SOLN Place 1 drop into both eyes 2 (two) times daily.    [provider]  Chromium Picolinate 1000 MCG TABS Take 1,000 mg by mouth daily.    [provider]  cycloSPORINE (RESTASIS) 0.05 % ophthalmic emulsion Place 1 drop into both eyes 2 (two) times daily.    [provider]  lisinopril (PRINIVIL,ZESTRIL) 10 MG tablet Take 10 mg by mouth daily. 07/18/15   [provider]  meclizine (ANTIVERT) 25 MG tablet  Take 1 tablet (25 mg total) by mouth 3 (three) times daily as needed for dizziness. 10/31/16   Pattricia Boss, MD  Multiple Vitamins-Minerals (MULTIVITAMIN WITH MINERALS) tablet Take 1 tablet by mouth daily.    [provider]  Omega-3 Fatty Acids (FISH OIL) 1200 MG CAPS Take 1,200 mg by mouth 2 (two) times daily.    [provider]    Allergies    Patient has no known allergies.  Review of Systems   Review of Systems  All other systems reviewed and are negative.  Physical Exam Updated Vital Signs BP (!) 169/99 (BP Location: Right Arm)   Pulse 93   Temp 97.9 F (36.6 C) (Oral)   Resp 18   Ht 5\' 10"  (1.778 m)   Wt 54.9 kg   SpO2 98%   BMI 17.36 kg/m   Physical Exam Vitals and nursing note reviewed.  Constitutional:      Appearance: He is  well-developed.  HENT:     Head: Normocephalic and atraumatic.  Cardiovascular:     Rate and Rhythm: Regular rhythm. Tachycardia present.     Heart sounds: No murmur heard. Pulmonary:     Effort: Pulmonary effort is normal. No respiratory distress.     Breath sounds: Normal breath sounds.  Abdominal:     Palpations: Abdomen is soft.     Tenderness: There is no abdominal tenderness. There is no guarding or rebound.  Musculoskeletal:        General: No tenderness.     Cervical back: No tenderness.     Comments: There is an abrasion over the right elbow with range of motion intact, no significant swelling. There is no discrete bony tenderness throughout the back.  Skin:    General: Skin is warm and dry.  Neurological:     Mental Status: He is alert and oriented to person, place, and time.     Comments: Five out of five strength in all four extremities. No asymmetry of facial movements.  Psychiatric:        Behavior: Behavior normal.    ED Results / Procedures / Treatments   Labs (all labs ordered are listed, but only abnormal results are displayed) Labs Reviewed  COMPREHENSIVE METABOLIC PANEL - Abnormal; Notable for the following components:      Result Value   Glucose, Bld 140 (*)    BUN 24 (*)    AST 50 (*)    GFR, Estimated 57 (*)    All other components within normal limits  CBC WITH DIFFERENTIAL/PLATELET - Abnormal; Notable for the following components:   HCT 38.2 (*)    Neutro Abs 8.3 (*)    All other components within normal limits  URINALYSIS, ROUTINE W REFLEX MICROSCOPIC - Abnormal; Notable for the following components:   Ketones, ur 15 (*)    All other components within normal limits    EKG None  Radiology DG Elbow Complete Right  Result Date: 02/18/2021 CLINICAL DATA:  Fall. EXAM: RIGHT ELBOW - COMPLETE 3+ VIEW COMPARISON:  None. FINDINGS: There is no evidence of fracture, dislocation, or joint effusion. There is no evidence of arthropathy or other focal  bone abnormality. Soft tissues are unremarkable. IMPRESSION: Negative. Electronically Signed   By: Ronney Asters M.D.   On: 02/18/2021 19:33   CT Head Wo Contrast  Result Date: 02/19/2021 CLINICAL DATA:  Trauma. EXAM: CT HEAD WITHOUT CONTRAST TECHNIQUE: Contiguous axial images were obtained from the base of the skull through the vertex without intravenous  contrast. COMPARISON:  Head CT dated 01/07/2021. FINDINGS: Evaluation is limited due to streak artifact caused by left cochlear implant. Brain: Moderate age-related atrophy and chronic microvascular ischemic changes. There is no acute intracranial hemorrhage. No mass effect or midline shift. No extra-axial fluid collection. Vascular: No hyperdense vessel or unexpected calcification. Skull: Normal. Negative for fracture or focal lesion. Sinuses/Orbits: Mild mucoperiosteal thickening of paranasal sinuses. No air-fluid. Partial left mastoidectomy. Other: None IMPRESSION: 1. No acute intracranial pathology. 2. Moderate age-related atrophy and chronic microvascular ischemic changes. Electronically Signed   By: Anner Crete M.D.   On: 02/19/2021 02:21   CT Cervical Spine Wo Contrast  Result Date: 02/19/2021 CLINICAL DATA:  Neck trauma EXAM: CT CERVICAL SPINE WITHOUT CONTRAST TECHNIQUE: Multidetector CT imaging of the cervical spine was performed without intravenous contrast. Multiplanar CT image reconstructions were also generated. COMPARISON:  None. FINDINGS: Alignment: Normal. Skull base and vertebrae: No acute fracture. No primary bone lesion or focal pathologic process. Soft tissues and spinal canal: No prevertebral fluid or swelling. No visible canal hematoma. Disc levels: Multilevel degenerative changes with facet arthropathy and disc height loss. At C6-C7, there is a left subarticular disc protrusion that indents the ventral cord but does not cause significant spinal canal stenosis. No high-grade spinal canal stenosis or neural foraminal narrowing.  Upper chest: For findings in the thorax, please see same day CT chest. Other: Left cochlear implant. IMPRESSION: No acute fracture or traumatic listhesis in the cervical spine. Electronically Signed   By: Merilyn Baba M.D.   On: 02/19/2021 02:27   CT CHEST ABDOMEN PELVIS W CONTRAST  Result Date: 02/19/2021 CLINICAL DATA:  Abdominal trauma. Two falls today. Mid and low back pain. EXAM: CT CHEST, ABDOMEN, AND PELVIS WITH CONTRAST TECHNIQUE: Multidetector CT imaging of the chest, abdomen and pelvis was performed following the standard protocol during bolus administration of intravenous contrast. CONTRAST:  11mL OMNIPAQUE IOHEXOL 350 MG/ML SOLN COMPARISON:  Chest radiograph 09/07/2006 FINDINGS: CT CHEST FINDINGS Cardiovascular: Normal heart size. No pericardial effusions. Normal caliber thoracic aorta. No aortic dissection. Coronary artery and aortic calcifications. Great vessel origins are patent. Mediastinum/Nodes: Esophagus is decompressed. No significant lymphadenopathy in the chest. No abnormal mediastinal gas or fluid collections. Left thyroid gland nodule measuring 2.3 cm diameter. Lungs/Pleura: Calcification and scarring in the apices. Calcified granulomas in the lungs. Dependent atelectasis in the lung bases. Less than 5 mm subpleural nodules, likely benign perilymphatic nodules. No follow-up is indicated. No airspace disease or consolidation. No pleural effusions. No pneumothorax. Musculoskeletal: Normal alignment of the thoracic spine. Compression of T6 with superior and inferior endplate cortical depression, likely acute. No retropulsion of fracture fragments. Sternum and ribs are nondepressed. CT ABDOMEN PELVIS FINDINGS Hepatobiliary: Circumscribed low-attenuation lesion in the dome of the liver measuring 1.6 cm diameter, likely a cyst. Focal peripheral area of hyperenhancement in the dome of the liver with washout on delayed images, likely transient hepatic attenuation or possibly focal nodular  hyperplasia. Additional smaller cysts in the liver. Gallbladder is not visualized and may be contracted or surgically absent. No bile duct dilatation. Pancreas: Unremarkable. No pancreatic ductal dilatation or surrounding inflammatory changes. Spleen: Normal in size without focal abnormality. Adrenals/Urinary Tract: No adrenal gland nodules. Prominent bilateral hydronephrosis and hydroureter with marked distention of the bladder. This likely represents reflux disease, likely related to bladder outlet obstruction. Nephrograms are symmetrical. No solid mass lesions identified. No bladder wall thickening. Stomach/Bowel: Stomach, small bowel, and colon are not abnormally distended. No wall thickening is appreciated although under  distention limits evaluation. Appendix is not identified. Vascular/Lymphatic: Aortic atherosclerosis. No enlarged abdominal or pelvic lymph nodes. Reproductive: Prostate gland is diffusely enlarged, measuring 5.9 cm diameter. Other: No free air or free fluid in the abdomen. Abdominal wall musculature appears intact. Musculoskeletal: Normal alignment of the lumbar spine. No lumbar compression deformities. Sacrum, pelvis, and hips appear intact. IMPRESSION: 1. Acute appearing compression of the T6 vertebra. 2. No evidence of mediastinal injury or pulmonary parenchymal injury. 3. 2.3 cm incidental left thyroid nodule. Recommend thyroid US if clinically warranted given patient age. Reference: J Am Coll Radiol. 2015 Feb;12(2): 143-50 4. Benign-appearing lesions demonstrated in the liver. 5. Severe distention of the bladder with bilateral hydronephrosis and hydroureter likely representing outlet obstruction with reflux. 6. Enlarged prostate gland. 7. Aortic atherosclerosis. Electronically Signed   By: Lucienne Capers M.D.   On: 02/19/2021 02:32    Procedures Procedures   Medications Ordered in ED Medications  iohexol (OMNIPAQUE) 350 MG/ML injection 85 mL (85 mLs Intravenous Contrast Given  02/19/21 0159)  acetaminophen (TYLENOL) tablet 650 mg (650 mg Oral Given 02/19/21 0403)    ED Course  I have reviewed the triage vital signs and the nursing notes.  Pertinent labs & imaging results that were available during my care of the patient were reviewed by me and considered in my medical decision making (see chart for details).    MDM Rules/Calculators/A&P                          patient with history of hypertension, cochlear implant here for evaluation following three separate falls today. He states that he felt lightheaded when he fell. No syncope. He does complain of some back pain. On evaluation he is well appearing in no acute distress. He was tachycardic on ED presentation, resolved without IV fluids. Given description of pain CT head, chest abdomen pelvis were obtained. Imaging is significant for acute T6 compression fracture, no additional injuries. Patient initially declined pain medications in the emergency department, but later was agreeable to Tylenol. He is able to ambulate the department with a steady gait. No evidence of acute infectious process based off of history, examination and studies. Discussed with patient and wife findings of studies, incidental finding of enlarged bladder. Patient is able to void without difficulty in the emergency department. Do not feel that he needs an acute Foley catheter at this time. Discussed PCP follow-up regarding incidental findings as well as his T6 compression fracture. Plan to discharge home with close outpatient follow-up and return precautions.  Final Clinical Impression(s) / ED Diagnoses Final diagnoses:  Closed wedge compression fracture of T6 vertebra, initial encounter [facility])  Fall, initial encounter    Rx / DC Orders ED Discharge Orders     None        Quintella Reichert, MD 02/19/21 [id number]

## 2021-02-19 ENCOUNTER — Emergency Department (HOSPITAL_BASED_OUTPATIENT_CLINIC_OR_DEPARTMENT_OTHER): Payer: Medicare Other

## 2021-02-19 DIAGNOSIS — S299XXA Unspecified injury of thorax, initial encounter: Secondary | ICD-10-CM | POA: Diagnosis not present

## 2021-02-19 DIAGNOSIS — I2510 Atherosclerotic heart disease of native coronary artery without angina pectoris: Secondary | ICD-10-CM | POA: Diagnosis not present

## 2021-02-19 DIAGNOSIS — R296 Repeated falls: Secondary | ICD-10-CM | POA: Diagnosis not present

## 2021-02-19 DIAGNOSIS — J3489 Other specified disorders of nose and nasal sinuses: Secondary | ICD-10-CM | POA: Diagnosis not present

## 2021-02-19 DIAGNOSIS — N1330 Unspecified hydronephrosis: Secondary | ICD-10-CM | POA: Diagnosis not present

## 2021-02-19 DIAGNOSIS — S3991XA Unspecified injury of abdomen, initial encounter: Secondary | ICD-10-CM | POA: Diagnosis not present

## 2021-02-19 DIAGNOSIS — S0990XA Unspecified injury of head, initial encounter: Secondary | ICD-10-CM | POA: Diagnosis not present

## 2021-02-19 DIAGNOSIS — M545 Low back pain, unspecified: Secondary | ICD-10-CM | POA: Diagnosis not present

## 2021-02-19 DIAGNOSIS — S22050A Wedge compression fracture of T5-T6 vertebra, initial encounter for closed fracture: Secondary | ICD-10-CM | POA: Diagnosis not present

## 2021-02-19 DIAGNOSIS — S199XXA Unspecified injury of neck, initial encounter: Secondary | ICD-10-CM | POA: Diagnosis not present

## 2021-02-19 LAB — COMPREHENSIVE METABOLIC PANEL
ALT: 26 U/L (ref 0–44)
AST: 50 U/L — ABNORMAL HIGH (ref 15–41)
Albumin: 4.4 g/dL (ref 3.5–5.0)
Alkaline Phosphatase: 43 U/L (ref 38–126)
Anion gap: 13 (ref 5–15)
BUN: 24 mg/dL — ABNORMAL HIGH (ref 8–23)
CO2: 22 mmol/L (ref 22–32)
Calcium: 9.9 mg/dL (ref 8.9–10.3)
Chloride: 103 mmol/L (ref 98–111)
Creatinine, Ser: 1.23 mg/dL (ref 0.61–1.24)
GFR, Estimated: 57 mL/min — ABNORMAL LOW (ref >60)
Glucose, Bld: 140 mg/dL — ABNORMAL HIGH (ref 70–99)
Potassium: 3.8 mmol/L (ref 3.5–5.1)
Sodium: 138 mmol/L (ref 135–145)
Total Bilirubin: 1.1 mg/dL (ref 0.3–1.2)
Total Protein: 7.5 g/dL (ref 6.5–8.1)

## 2021-02-19 LAB — URINALYSIS, ROUTINE W REFLEX MICROSCOPIC
Bilirubin Urine: NEGATIVE
Glucose, UA: NEGATIVE mg/dL
Hgb urine dipstick: NEGATIVE
Ketones, ur: 15 mg/dL — AB
Leukocytes,Ua: NEGATIVE
Nitrite: NEGATIVE
Protein, ur: NEGATIVE mg/dL
Specific Gravity, Urine: 1.005 (ref 1.005–1.030)
pH: 6 (ref 5.0–8.0)

## 2021-02-19 LAB — CBC WITH DIFFERENTIAL/PLATELET
Abs Immature Granulocytes: 0.05 10*3/uL (ref 0.00–0.07)
Basophils Absolute: 0 10*3/uL (ref 0.0–0.1)
Basophils Relative: 0 %
Eosinophils Absolute: 0 10*3/uL (ref 0.0–0.5)
Eosinophils Relative: 0 %
HCT: 38.2 % — ABNORMAL LOW (ref 39.0–52.0)
Hemoglobin: 13.2 g/dL (ref 13.0–17.0)
Immature Granulocytes: 1 %
Lymphocytes Relative: 10 %
Lymphs Abs: 1 10*3/uL (ref 0.7–4.0)
MCH: 30.6 pg (ref 26.0–34.0)
MCHC: 34.6 g/dL (ref 30.0–36.0)
MCV: 88.6 fL (ref 80.0–100.0)
Monocytes Absolute: 0.9 10*3/uL (ref 0.1–1.0)
Monocytes Relative: 9 %
Neutro Abs: 8.3 10*3/uL — ABNORMAL HIGH (ref 1.7–7.7)
Neutrophils Relative %: 80 %
Platelets: 184 10*3/uL (ref 150–400)
RBC: 4.31 MIL/uL (ref 4.22–5.81)
RDW: 14.1 % (ref 11.5–15.5)
WBC: 10.3 10*3/uL (ref 4.0–10.5)
nRBC: 0 % (ref 0.0–0.2)

## 2021-02-19 MED ORDER — ACETAMINOPHEN 325 MG PO TABS
650.0000 mg | ORAL_TABLET | Freq: Once | ORAL | Status: AC
  Administered 2021-02-19: 650 mg via ORAL
  Filled 2021-02-19: qty 2

## 2021-02-19 MED ORDER — IOHEXOL 350 MG/ML SOLN
85.0000 mL | Freq: Once | INTRAVENOUS | Status: AC | PRN
  Administered 2021-02-19: 85 mL via INTRAVENOUS

## 2021-02-19 NOTE — Discharge Instructions (Signed)
You have a broken bone in your back at T6 called a compression fracture. You may take Tylenol, available over-the-counter according to label instructions as needed for pain. Please get rechecked if you develop fevers, your pain is uncontrolled or you have new numbness or weakness.  You had a CT scan today that showed incidental findings that will need to be followed up with your family doctor. Your CT scan showed a thyroid nodule as well as enlargement of your bladder that might be due to having difficulty emptying your bladder. Get rechecked immediately if you cannot urinate.

## 2021-02-19 NOTE — ED Notes (Signed)
Pt ambulate with stand by assistance no issues noted.

## 2021-03-03 DIAGNOSIS — Z23 Encounter for immunization: Secondary | ICD-10-CM | POA: Diagnosis not present

## 2021-03-03 DIAGNOSIS — Z0000 Encounter for general adult medical examination without abnormal findings: Secondary | ICD-10-CM | POA: Diagnosis not present

## 2021-03-03 DIAGNOSIS — I700 Atherosclerosis of aorta: Secondary | ICD-10-CM | POA: Diagnosis not present

## 2021-03-03 DIAGNOSIS — E1159 Type 2 diabetes mellitus with other circulatory complications: Secondary | ICD-10-CM | POA: Diagnosis not present

## 2021-03-03 DIAGNOSIS — M4850XA Collapsed vertebra, not elsewhere classified, site unspecified, initial encounter for fracture: Secondary | ICD-10-CM | POA: Diagnosis not present

## 2021-03-03 DIAGNOSIS — N1330 Unspecified hydronephrosis: Secondary | ICD-10-CM | POA: Diagnosis not present

## 2021-03-03 DIAGNOSIS — Z681 Body mass index (BMI) 19 or less, adult: Secondary | ICD-10-CM | POA: Diagnosis not present

## 2021-03-03 DIAGNOSIS — I10 Essential (primary) hypertension: Secondary | ICD-10-CM | POA: Diagnosis not present

## 2021-03-03 DIAGNOSIS — E46 Unspecified protein-calorie malnutrition: Secondary | ICD-10-CM | POA: Diagnosis not present

## 2021-03-03 DIAGNOSIS — R54 Age-related physical debility: Secondary | ICD-10-CM | POA: Diagnosis not present

## 2021-03-03 DIAGNOSIS — E7800 Pure hypercholesterolemia, unspecified: Secondary | ICD-10-CM | POA: Diagnosis not present

## 2021-03-04 ENCOUNTER — Telehealth (HOSPITAL_BASED_OUTPATIENT_CLINIC_OR_DEPARTMENT_OTHER): Payer: Self-pay

## 2021-03-04 ENCOUNTER — Other Ambulatory Visit (HOSPITAL_BASED_OUTPATIENT_CLINIC_OR_DEPARTMENT_OTHER): Payer: Self-pay | Admitting: Family Medicine

## 2021-03-04 DIAGNOSIS — S32000A Wedge compression fracture of unspecified lumbar vertebra, initial encounter for closed fracture: Secondary | ICD-10-CM

## 2021-03-18 ENCOUNTER — Other Ambulatory Visit (HOSPITAL_BASED_OUTPATIENT_CLINIC_OR_DEPARTMENT_OTHER): Payer: Medicare Other

## 2021-03-23 ENCOUNTER — Encounter (HOSPITAL_COMMUNITY): Payer: Self-pay | Admitting: Radiology

## 2021-03-25 ENCOUNTER — Ambulatory Visit: Payer: Medicare Other | Attending: Internal Medicine

## 2021-03-25 ENCOUNTER — Ambulatory Visit (HOSPITAL_BASED_OUTPATIENT_CLINIC_OR_DEPARTMENT_OTHER)
Admission: RE | Admit: 2021-03-25 | Discharge: 2021-03-25 | Disposition: A | Discharge status: Home / Self Care | Payer: Medicare Other | Source: Ambulatory Visit | Attending: Family Medicine | Admitting: Family Medicine

## 2021-03-25 ENCOUNTER — Other Ambulatory Visit: Payer: Self-pay

## 2021-03-25 DIAGNOSIS — Z23 Encounter for immunization: Secondary | ICD-10-CM

## 2021-03-25 DIAGNOSIS — S32000A Wedge compression fracture of unspecified lumbar vertebra, initial encounter for closed fracture: Secondary | ICD-10-CM | POA: Insufficient documentation

## 2021-03-25 DIAGNOSIS — M8589 Other specified disorders of bone density and structure, multiple sites: Secondary | ICD-10-CM | POA: Diagnosis not present

## 2021-03-25 NOTE — Progress Notes (Signed)
   Covid-19 Vaccination Clinic  Name:  Raymond Fowler    MRN: [id number] DOB: 01-25-1933  03/25/2021  [name] was observed post Covid-19 immunization for 15 minutes without incident. He was provided with Vaccine Information Sheet and instruction to access the V-Safe system.   [name] was instructed to call 911 with any severe reactions post vaccine: Difficulty breathing  Swelling of face and throat  A fast heartbeat  A bad rash all over body  Dizziness and weakness   Immunizations Administered     Name Date Dose VIS Date Route   Pfizer Covid-19 Vaccine Bivalent Booster 03/25/2021  2:41 PM 0.3 mL 01/27/2021 Intramuscular   Manufacturer: [place]   Lot: [id number]   [place]: [phone]

## 2021-04-08 DIAGNOSIS — H43813 Vitreous degeneration, bilateral: Secondary | ICD-10-CM | POA: Diagnosis not present

## 2021-04-08 DIAGNOSIS — I10 Essential (primary) hypertension: Secondary | ICD-10-CM | POA: Diagnosis not present

## 2021-04-08 DIAGNOSIS — H524 Presbyopia: Secondary | ICD-10-CM | POA: Diagnosis not present

## 2021-04-08 DIAGNOSIS — H40023 Open angle with borderline findings, high risk, bilateral: Secondary | ICD-10-CM | POA: Diagnosis not present

## 2021-04-08 DIAGNOSIS — M810 Age-related osteoporosis without current pathological fracture: Secondary | ICD-10-CM | POA: Diagnosis not present

## 2021-04-08 DIAGNOSIS — I700 Atherosclerosis of aorta: Secondary | ICD-10-CM | POA: Diagnosis not present

## 2021-04-08 DIAGNOSIS — H04123 Dry eye syndrome of bilateral lacrimal glands: Secondary | ICD-10-CM | POA: Diagnosis not present

## 2021-04-08 DIAGNOSIS — D3131 Benign neoplasm of right choroid: Secondary | ICD-10-CM | POA: Diagnosis not present

## 2021-04-16 DIAGNOSIS — R3914 Feeling of incomplete bladder emptying: Secondary | ICD-10-CM | POA: Diagnosis not present

## 2021-04-16 DIAGNOSIS — N401 Enlarged prostate with lower urinary tract symptoms: Secondary | ICD-10-CM | POA: Diagnosis not present

## 2021-04-16 DIAGNOSIS — N130 Hydronephrosis with ureteropelvic junction obstruction: Secondary | ICD-10-CM | POA: Diagnosis not present

## 2021-04-19 ENCOUNTER — Other Ambulatory Visit (HOSPITAL_BASED_OUTPATIENT_CLINIC_OR_DEPARTMENT_OTHER): Payer: Self-pay

## 2021-04-19 DIAGNOSIS — N130 Hydronephrosis with ureteropelvic junction obstruction: Secondary | ICD-10-CM | POA: Diagnosis not present

## 2021-04-19 DIAGNOSIS — R3914 Feeling of incomplete bladder emptying: Secondary | ICD-10-CM | POA: Diagnosis not present

## 2021-04-19 DIAGNOSIS — N401 Enlarged prostate with lower urinary tract symptoms: Secondary | ICD-10-CM | POA: Diagnosis not present

## 2021-04-19 DIAGNOSIS — Z23 Encounter for immunization: Secondary | ICD-10-CM | POA: Diagnosis not present

## 2021-04-19 MED ORDER — PFIZER COVID-19 VAC BIVALENT 30 MCG/0.3ML IM SUSP
INTRAMUSCULAR | 0 refills | Status: DC
  Filled 2021-04-19: qty 0.3, 1d supply, fill #0

## 2021-05-17 DIAGNOSIS — R3914 Feeling of incomplete bladder emptying: Secondary | ICD-10-CM | POA: Diagnosis not present

## 2021-05-17 DIAGNOSIS — N401 Enlarged prostate with lower urinary tract symptoms: Secondary | ICD-10-CM | POA: Diagnosis not present

## 2021-05-17 DIAGNOSIS — N130 Hydronephrosis with ureteropelvic junction obstruction: Secondary | ICD-10-CM | POA: Diagnosis not present

## 2021-05-25 DIAGNOSIS — N401 Enlarged prostate with lower urinary tract symptoms: Secondary | ICD-10-CM | POA: Diagnosis not present

## 2021-05-25 DIAGNOSIS — R3914 Feeling of incomplete bladder emptying: Secondary | ICD-10-CM | POA: Diagnosis not present

## 2021-06-14 DIAGNOSIS — R3914 Feeling of incomplete bladder emptying: Secondary | ICD-10-CM | POA: Diagnosis not present

## 2021-06-21 DIAGNOSIS — N319 Neuromuscular dysfunction of bladder, unspecified: Secondary | ICD-10-CM | POA: Diagnosis not present

## 2021-06-21 DIAGNOSIS — N130 Hydronephrosis with ureteropelvic junction obstruction: Secondary | ICD-10-CM | POA: Diagnosis not present

## 2021-06-21 DIAGNOSIS — R3914 Feeling of incomplete bladder emptying: Secondary | ICD-10-CM | POA: Diagnosis not present

## 2021-07-05 ENCOUNTER — Other Ambulatory Visit (HOSPITAL_BASED_OUTPATIENT_CLINIC_OR_DEPARTMENT_OTHER): Payer: Self-pay

## 2021-07-05 ENCOUNTER — Emergency Department (HOSPITAL_BASED_OUTPATIENT_CLINIC_OR_DEPARTMENT_OTHER)
Admission: EM | Admit: 2021-07-05 | Discharge: 2021-07-05 | Disposition: A | Discharge status: Home / Self Care | Payer: Medicare Other | Attending: Emergency Medicine | Admitting: Emergency Medicine

## 2021-07-05 ENCOUNTER — Other Ambulatory Visit: Payer: Self-pay

## 2021-07-05 ENCOUNTER — Emergency Department (HOSPITAL_BASED_OUTPATIENT_CLINIC_OR_DEPARTMENT_OTHER): Payer: Medicare Other

## 2021-07-05 ENCOUNTER — Encounter (HOSPITAL_BASED_OUTPATIENT_CLINIC_OR_DEPARTMENT_OTHER): Payer: Self-pay

## 2021-07-05 DIAGNOSIS — R42 Dizziness and giddiness: Secondary | ICD-10-CM | POA: Insufficient documentation

## 2021-07-05 DIAGNOSIS — R000 Tachycardia, unspecified: Secondary | ICD-10-CM

## 2021-07-05 DIAGNOSIS — N179 Acute kidney failure, unspecified: Secondary | ICD-10-CM | POA: Diagnosis not present

## 2021-07-05 DIAGNOSIS — R110 Nausea: Secondary | ICD-10-CM | POA: Diagnosis not present

## 2021-07-05 DIAGNOSIS — E860 Dehydration: Secondary | ICD-10-CM | POA: Diagnosis not present

## 2021-07-05 DIAGNOSIS — Z79899 Other long term (current) drug therapy: Secondary | ICD-10-CM | POA: Insufficient documentation

## 2021-07-05 DIAGNOSIS — E119 Type 2 diabetes mellitus without complications: Secondary | ICD-10-CM | POA: Insufficient documentation

## 2021-07-05 DIAGNOSIS — Z7982 Long term (current) use of aspirin: Secondary | ICD-10-CM | POA: Insufficient documentation

## 2021-07-05 DIAGNOSIS — Z20822 Contact with and (suspected) exposure to covid-19: Secondary | ICD-10-CM | POA: Diagnosis not present

## 2021-07-05 DIAGNOSIS — I10 Essential (primary) hypertension: Secondary | ICD-10-CM | POA: Insufficient documentation

## 2021-07-05 LAB — COMPREHENSIVE METABOLIC PANEL
ALT: 27 U/L (ref 0–44)
AST: 32 U/L (ref 15–41)
Albumin: 4.1 g/dL (ref 3.5–5.0)
Alkaline Phosphatase: 34 U/L — ABNORMAL LOW (ref 38–126)
Anion gap: 9 (ref 5–15)
BUN: 23 mg/dL (ref 8–23)
CO2: 28 mmol/L (ref 22–32)
Calcium: 9.7 mg/dL (ref 8.9–10.3)
Chloride: 97 mmol/L — ABNORMAL LOW (ref 98–111)
Creatinine, Ser: 1.27 mg/dL — ABNORMAL HIGH (ref 0.61–1.24)
GFR, Estimated: 55 mL/min — ABNORMAL LOW (ref >60)
Glucose, Bld: 162 mg/dL — ABNORMAL HIGH (ref 70–99)
Potassium: 4.7 mmol/L (ref 3.5–5.1)
Sodium: 134 mmol/L — ABNORMAL LOW (ref 135–145)
Total Bilirubin: 0.6 mg/dL (ref 0.3–1.2)
Total Protein: 7.4 g/dL (ref 6.5–8.1)

## 2021-07-05 LAB — CBC WITH DIFFERENTIAL/PLATELET
Abs Immature Granulocytes: 0.02 10*3/uL (ref 0.00–0.07)
Basophils Absolute: 0 10*3/uL (ref 0.0–0.1)
Basophils Relative: 0 %
Eosinophils Absolute: 0.1 10*3/uL (ref 0.0–0.5)
Eosinophils Relative: 1 %
HCT: 44.2 % (ref 39.0–52.0)
Hemoglobin: 14.9 g/dL (ref 13.0–17.0)
Immature Granulocytes: 0 %
Lymphocytes Relative: 18 %
Lymphs Abs: 1.3 10*3/uL (ref 0.7–4.0)
MCH: 30.3 pg (ref 26.0–34.0)
MCHC: 33.7 g/dL (ref 30.0–36.0)
MCV: 90 fL (ref 80.0–100.0)
Monocytes Absolute: 0.8 10*3/uL (ref 0.1–1.0)
Monocytes Relative: 10 %
Neutro Abs: 5.2 10*3/uL (ref 1.7–7.7)
Neutrophils Relative %: 71 %
Platelets: 229 10*3/uL (ref 150–400)
RBC: 4.91 MIL/uL (ref 4.22–5.81)
RDW: 13.3 % (ref 11.5–15.5)
WBC: 7.4 10*3/uL (ref 4.0–10.5)
nRBC: 0 % (ref 0.0–0.2)

## 2021-07-05 LAB — URINALYSIS, MICROSCOPIC (REFLEX)

## 2021-07-05 LAB — RESP PANEL BY RT-PCR (FLU A&B, COVID) ARPGX2
Influenza A by PCR: NEGATIVE
Influenza B by PCR: NEGATIVE
SARS Coronavirus 2 by RT PCR: NEGATIVE

## 2021-07-05 LAB — URINALYSIS, ROUTINE W REFLEX MICROSCOPIC
Bilirubin Urine: NEGATIVE
Glucose, UA: NEGATIVE mg/dL
Hgb urine dipstick: NEGATIVE
Ketones, ur: NEGATIVE mg/dL
Nitrite: NEGATIVE
Protein, ur: NEGATIVE mg/dL
Specific Gravity, Urine: 1.015 (ref 1.005–1.030)
pH: 7 (ref 5.0–8.0)

## 2021-07-05 LAB — LACTIC ACID, PLASMA: Lactic Acid, Venous: 1.2 mmol/L (ref 0.5–1.9)

## 2021-07-05 LAB — TROPONIN I (HIGH SENSITIVITY): Troponin I (High Sensitivity): 10 ng/L (ref <18)

## 2021-07-05 LAB — LIPASE, BLOOD: Lipase: 40 U/L (ref 11–51)

## 2021-07-05 MED ORDER — SODIUM CHLORIDE 0.9 % IV BOLUS
500.0000 mL | Freq: Once | INTRAVENOUS | Status: AC
  Administered 2021-07-05: 500 mL via INTRAVENOUS

## 2021-07-05 MED ORDER — ONDANSETRON 4 MG PO TBDP
4.0000 mg | ORAL_TABLET | Freq: Three times a day (TID) | ORAL | 1 refills | Status: DC | PRN
  Filled 2021-07-05: qty 12, 4d supply, fill #0

## 2021-07-05 MED ORDER — SODIUM CHLORIDE 0.9 % IV SOLN: INTRAVENOUS | Status: DC

## 2021-07-05 MED ORDER — ONDANSETRON HCL 4 MG/2ML IJ SOLN: 4.0000 mg | Freq: Once | INTRAMUSCULAR | Status: DC

## 2021-07-05 NOTE — ED Triage Notes (Signed)
Pt arrives with wife who reports patient has been c/o upset stomach, dizziness, lightheadedness, and high HR. Pt started feeling bad today.

## 2021-07-05 NOTE — Discharge Instructions (Signed)
Make an appointment to follow-up with cardiology because of the rapid heart rate you had here.  They may decide to do some home monitoring.  Take the antinausea medicine Zofran as directed.  Pick it up at the pharmacy here.  Rest of work-up without any significant abnormalities.  COVID flu testing negative.  Urinalysis negative.  No signs of significant infection.  Return for any new or worse symptoms.

## 2021-07-05 NOTE — ED Notes (Signed)
Presents to the ED with wife, pt states has fast HR, placed on cardiac monitoring, noted that HR was 164/min, had pt perform vagal tech x 2, HR rate decreased to 88/min, noted to have several unifocal PVCs and HR increased to 140's, HR is varied and noted to be narrow rhythm, occ unifocal PVCs, rate varies from 88-153/min. Denies chest pain and shortness and breath.

## 2021-07-05 NOTE — ED Provider Notes (Signed)
[facility] EMERGENCY DEPARTMENT Provider Note   CSN: [id number] Arrival date & time: 07/05/21  1206     History  Chief Complaint  Patient presents with   Abdominal Pain    Raymond Fowler is a 88 y.o. male.  Patient felt fine yesterday.  Today he felt nauseated.  Really did not have any abdominal pain.  Little bit of lightheadedness and felt like his heart rate was going fast.  Patient arrived here with a sinus tachycardia going up to as high as 163.  No fever.  No vomiting no diarrhea.  No chest pain no shortness of breath.  No upper respiratory symptoms.  Past medical history significant for diabetes hypertension cerebrovascular disease history of stroke in 2018.  Patient followed by primary care doctor for weight loss.  Past surgical history significant for cholecystectomy.  Appendectomy.  Cochlear implant.      Home Medications Prior to Admission medications   Medication Sig Start Date End Date Taking? Authorizing Provider  ondansetron (ZOFRAN-ODT) 4 MG disintegrating tablet Take 1 tablet (4 mg total) by mouth every 8 (eight) hours as needed for nausea or vomiting. 07/05/21  Yes Fredia Sorrow, MD  Ascorbic Acid (VITAMIN C) 1000 MG tablet Take 1,000 mg by mouth daily.    [provider]  aspirin EC 325 MG tablet Take 325 mg by mouth daily.    [provider]  atorvastatin (LIPITOR) 20 MG tablet Take 20 mg by mouth every evening. 07/18/15   [provider]  brimonidine (ALPHAGAN) 0.2 % ophthalmic solution Place 1 drop into both eyes 2 (two) times daily. 05/29/15   [provider]  Carboxymethylcellul-Glycerin (OPTIVE) 0.5-0.9 % SOLN Place 1 drop into both eyes 2 (two) times daily.    [provider]  Chromium Picolinate 1000 MCG TABS Take 1,000 mg by mouth daily.    [provider]  COVID-19 mRNA bivalent vaccine, Pfizer, (PFIZER COVID-19 VAC BIVALENT) injection Inject into the muscle. 03/25/21   Carlyle Basques, MD   cycloSPORINE (RESTASIS) 0.05 % ophthalmic emulsion Place 1 drop into both eyes 2 (two) times daily.    [provider]  lisinopril (PRINIVIL,ZESTRIL) 10 MG tablet Take 10 mg by mouth daily. 07/18/15   [provider]  meclizine (ANTIVERT) 25 MG tablet Take 1 tablet (25 mg total) by mouth 3 (three) times daily as needed for dizziness. 10/31/16   Pattricia Boss, MD  Multiple Vitamins-Minerals (MULTIVITAMIN WITH MINERALS) tablet Take 1 tablet by mouth daily.    [provider]  Omega-3 Fatty Acids (FISH OIL) 1200 MG CAPS Take 1,200 mg by mouth 2 (two) times daily.    [provider]      Allergies    Patient has no known allergies.    Review of Systems   Review of Systems  Constitutional:  Negative for chills and fever.  HENT:  Positive for hearing loss. Negative for ear pain and sore throat.   Eyes:  Negative for pain and visual disturbance.  Respiratory:  Negative for cough and shortness of breath.   Cardiovascular:  Positive for palpitations. Negative for chest pain.  Gastrointestinal:  Positive for nausea. Negative for abdominal pain and vomiting.  Genitourinary:  Negative for dysuria and hematuria.  Musculoskeletal:  Negative for arthralgias and back pain.  Skin:  Negative for color change and rash.  Neurological:  Positive for light-headedness. Negative for seizures and syncope.  All other systems reviewed and are negative.  Physical Exam Updated Vital Signs BP (!) 142/79  Pulse 70    Temp (!) 95.3 F (35.2 C) (Tympanic)    Resp (!) 22    Ht 1.803 m (5\' 11" )    Wt 54.9 kg    SpO2 99%    BMI 16.88 kg/m  Physical Exam Vitals and nursing note reviewed.  Constitutional:      General: He is not in acute distress.    Appearance: Normal appearance. He is well-developed.     Comments: Patient very thin  HENT:     Head: Normocephalic and atraumatic.     Comments: Cochlear implant to the left side of the head.    Mouth/Throat:     Comments: Lucas  membranes slightly dry. Eyes:     Extraocular Movements: Extraocular movements intact.     Conjunctiva/sclera: Conjunctivae normal.     Pupils: Pupils are equal, round, and reactive to light.  Cardiovascular:     Rate and Rhythm: Regular rhythm. Tachycardia present.     Heart sounds: No murmur heard. Pulmonary:     Effort: Pulmonary effort is normal. No respiratory distress.     Breath sounds: Normal breath sounds.  Abdominal:     Palpations: Abdomen is soft.     Tenderness: There is no abdominal tenderness.  Musculoskeletal:        General: No swelling.     Cervical back: Normal range of motion and neck supple.  Skin:    General: Skin is warm and dry.     Capillary Refill: Capillary refill takes less than 2 seconds.  Neurological:     General: No focal deficit present.     Mental Status: He is alert and oriented to person, place, and time.     Cranial Nerves: No cranial nerve deficit.     Sensory: No sensory deficit.     Motor: No weakness.  Psychiatric:        Mood and Affect: Mood normal.    ED Results / Procedures / Treatments   Labs (all labs ordered are listed, but only abnormal results are displayed) Labs Reviewed  COMPREHENSIVE METABOLIC PANEL - Abnormal; Notable for the following components:      Result Value   Sodium 134 (*)    Chloride 97 (*)    Glucose, Bld 162 (*)    Creatinine, Ser 1.27 (*)    Alkaline Phosphatase 34 (*)    GFR, Estimated 55 (*)    All other components within normal limits  URINALYSIS, ROUTINE W REFLEX MICROSCOPIC - Abnormal; Notable for the following components:   Leukocytes,Ua MODERATE (*)    All other components within normal limits  URINALYSIS, MICROSCOPIC (REFLEX) - Abnormal; Notable for the following components:   Bacteria, UA RARE (*)    All other components within normal limits  RESP PANEL BY RT-PCR (FLU A&B, COVID) ARPGX2  CULTURE, BLOOD (ROUTINE X 2)  CULTURE, BLOOD (ROUTINE X 2)  URINE CULTURE  CBC WITH  DIFFERENTIAL/PLATELET  LIPASE, BLOOD  LACTIC ACID, PLASMA  TROPONIN I (HIGH SENSITIVITY)  TROPONIN I (HIGH SENSITIVITY)    EKG EKG Interpretation  Date/Time:  Monday July 05 2021 12:19:17 EST Ventricular Rate:  163 PR Interval:  170 QRS Duration: 76 QT Interval:  200 QTC Calculation: 329 R Axis:   84 Text Interpretation: Sinus tachycardia Cannot rule out Anteroseptal infarct , age undetermined Marked ST abnormality, possible inferior subendocardial injury Abnormal ECG When compared with ECG of 18-Feb-2021 23:59, PREVIOUS ECG IS PRESENT Confirmed by Fredia Sorrow [phone]) on 07/05/2021 12:28:27 PM  Radiology  DG Chest Port 1 View  Result Date: 07/05/2021 CLINICAL DATA:  Nausea and tachycardia in an 88 year old male. EXAM: PORTABLE CHEST 1 VIEW COMPARISON:  September 07, 2006. FINDINGS: EKG leads project over the chest. Cardiomediastinal contours and hilar structures are normal. Lungs are clear.  No visible pneumothorax. No sign of effusion on frontal radiograph. On limited assessment there is no acute skeletal finding. IMPRESSION: No acute cardiopulmonary disease. Electronically Signed   By: Zetta Bills M.D.   On: 07/05/2021 13:13    Procedures Procedures  Rate monitor initially was a sinus tachycardia ranging from the 90s up to the 160s.  But mostly around the 120s.  Medications Ordered in ED Medications  0.9 %  sodium chloride infusion ( Intravenous New Bag/Given 07/05/21 1359)  ondansetron (ZOFRAN) injection 4 mg (has no administration in time range)  sodium chloride 0.9 % bolus 500 mL (500 mLs Intravenous New Bag/Given 07/05/21 1324)    ED Course/ Medical Decision Making/ A&P                           Medical Decision Making Amount and/or Complexity of Data Reviewed Labs: ordered. Radiology: ordered.  Risk Prescription drug management.   Patient's EKG and cardiac monitoring consistent with a sinus tachycardia.  His heart rate would range anywhere from upper 90s all the  way up to 160.  But mostly was around 124.  Definitely not looking like SVT or atrial fib or flutter.  Denied any chest pain.  Troponin was normal at 10.  Lactic acid was normal.  1.2.  Urinalysis not consistent with urinary tract infection.  Complete metabolic panel creatinine up some at 1.27 for a GFR 55 but not too far off baseline.  Blood sugar normal at 162.  CBC normal white blood cell count hemoglobin normal.  Lipase normal.  Chest x-ray negative.  EKG consistent with a sinus tachycardia.  Patient's COVID influenza testing is negative.  Patient has blood cultures pending.  We were not sure how this was going to play out.  Patient received 500 cc bolus of normal saline.  Received 100 cc an hour as some fluid resuscitation.  And received Zofran.  I think the Zofran is really with cutting the nausea made a difference in the heart rate.  Because he has been in the 70s ever since these things.  Follow-up with cardiology just for consideration for home monitoring.  Patient stable for discharge home.  Patient feeling fine.  Will continue with Zofran.  That the nausea could be secondary to a GI bug.  But no vomiting.  No evidence of an acute cardiac event based on his troponins.  Onset of symptoms were early this morning.   Final Clinical Impression(s) / ED Diagnoses Final diagnoses:  Nausea  Tachycardia  Dehydration    Rx / DC Orders ED Discharge Orders          Ordered    ondansetron (ZOFRAN-ODT) 4 MG disintegrating tablet  Every 8 hours PRN        07/05/21 1510              Fredia Sorrow, MD 07/05/21 1517

## 2021-07-06 ENCOUNTER — Other Ambulatory Visit: Payer: Self-pay

## 2021-07-06 DIAGNOSIS — E1159 Type 2 diabetes mellitus with other circulatory complications: Secondary | ICD-10-CM | POA: Insufficient documentation

## 2021-07-06 DIAGNOSIS — I700 Atherosclerosis of aorta: Secondary | ICD-10-CM

## 2021-07-06 DIAGNOSIS — M8448XA Pathological fracture, other site, initial encounter for fracture: Secondary | ICD-10-CM

## 2021-07-06 DIAGNOSIS — R54 Age-related physical debility: Secondary | ICD-10-CM

## 2021-07-06 DIAGNOSIS — I10 Essential (primary) hypertension: Secondary | ICD-10-CM

## 2021-07-06 DIAGNOSIS — M810 Age-related osteoporosis without current pathological fracture: Secondary | ICD-10-CM

## 2021-07-06 DIAGNOSIS — E119 Type 2 diabetes mellitus without complications: Secondary | ICD-10-CM | POA: Insufficient documentation

## 2021-07-06 DIAGNOSIS — E46 Unspecified protein-calorie malnutrition: Secondary | ICD-10-CM | POA: Insufficient documentation

## 2021-07-06 DIAGNOSIS — E7800 Pure hypercholesterolemia, unspecified: Secondary | ICD-10-CM | POA: Insufficient documentation

## 2021-07-06 HISTORY — DX: Age-related physical debility: R54

## 2021-07-06 HISTORY — DX: Pure hypercholesterolemia, unspecified: E78.00

## 2021-07-06 HISTORY — DX: Age-related osteoporosis without current pathological fracture: M81.0

## 2021-07-06 HISTORY — DX: Essential (primary) hypertension: I10

## 2021-07-06 HISTORY — DX: Pathological fracture, other site, initial encounter for fracture: M84.48XA

## 2021-07-06 HISTORY — DX: Atherosclerosis of aorta: I70.0

## 2021-07-06 HISTORY — DX: Unspecified protein-calorie malnutrition: E46

## 2021-07-06 HISTORY — DX: Type 2 diabetes mellitus with other circulatory complications: E11.59

## 2021-07-07 LAB — URINE CULTURE: Culture: >=100000 — AB

## 2021-07-08 ENCOUNTER — Telehealth: Payer: Self-pay | Admitting: *Deleted

## 2021-07-08 NOTE — Telephone Encounter (Signed)
Post ED Visit - Positive Culture Follow-up  Culture report reviewed by antimicrobial stewardship pharmacist: [facility] [place] Team []  Elenor Quinones, Pharm.D. []  Heide Guile, Pharm.D., BCPS AQ-ID []  Parks Neptune, Pharm.D., BCPS []  Alycia Rossetti, Pharm.D., BCPS []  [place], Pharm.D., BCPS, AAHIVP []  Legrand Como, Pharm.D., BCPS, AAHIVP []  Salome Arnt, PharmD, BCPS []  Johnnette Gourd, PharmD, BCPS []  Hughes Better, PharmD, BCPS []  Leeroy Cha, PharmD []  Laqueta Linden, PharmD, BCPS []  Albertina Parr, PharmD  [place] Team []  Leodis Sias, PharmD []  Lindell Spar, PharmD []  Royetta Asal, PharmD []  Graylin Shiver, Rph []  Rema Fendt) Glennon Mac, PharmD []  Arlyn Dunning, PharmD []  Netta Cedars, PharmD []  Dia Sitter, PharmD []  Leone Haven, PharmD []  Gretta Arab, PharmD []  Theodis Shove, PharmD []  Peggyann Juba, PharmD []  Reuel Boom, PharmD   Positive urine culture No UTI symptoms and no further patient follow-up is required at this time. Carlisle Cater, PA  Harlon Flor [place] 07/08/2021, 11:41 AM

## 2021-07-09 ENCOUNTER — Other Ambulatory Visit: Payer: Self-pay

## 2021-07-09 ENCOUNTER — Encounter: Payer: Self-pay | Admitting: Cardiology

## 2021-07-09 ENCOUNTER — Ambulatory Visit (INDEPENDENT_AMBULATORY_CARE_PROVIDER_SITE_OTHER): Payer: Medicare Other | Admitting: Cardiology

## 2021-07-09 VITALS — BP 110/62 | HR 85 | Ht 71.0 in | Wt 120.4 lb

## 2021-07-09 DIAGNOSIS — I471 Supraventricular tachycardia, unspecified: Secondary | ICD-10-CM

## 2021-07-09 DIAGNOSIS — E119 Type 2 diabetes mellitus without complications: Secondary | ICD-10-CM

## 2021-07-09 DIAGNOSIS — E7800 Pure hypercholesterolemia, unspecified: Secondary | ICD-10-CM | POA: Diagnosis not present

## 2021-07-09 DIAGNOSIS — I10 Essential (primary) hypertension: Secondary | ICD-10-CM

## 2021-07-09 DIAGNOSIS — R011 Cardiac murmur, unspecified: Secondary | ICD-10-CM

## 2021-07-09 DIAGNOSIS — R002 Palpitations: Secondary | ICD-10-CM

## 2021-07-09 HISTORY — DX: Type 2 diabetes mellitus without complications: E11.9

## 2021-07-09 HISTORY — DX: Cardiac murmur, unspecified: R01.1

## 2021-07-09 HISTORY — DX: Supraventricular tachycardia, unspecified: I47.10

## 2021-07-09 HISTORY — DX: Supraventricular tachycardia: I47.1

## 2021-07-09 HISTORY — DX: Palpitations: R00.2

## 2021-07-09 MED ORDER — METOPROLOL SUCCINATE ER 25 MG PO TB24: 25.0000 mg | ORAL_TABLET | Freq: Every day | ORAL | 6 refills | Status: DC

## 2021-07-09 NOTE — Patient Instructions (Signed)
Medication Instructions:  Your physician has recommended you make the following change in your medication:   Stop Lisinopril  Start Metoprolol Succinate 25 mg daily.  *If you need a refill on your cardiac medications before your next appointment, please call your pharmacy*   Lab Work: Your physician recommends that you have your TSH checked today.  If you have labs (blood work) drawn today and your tests are completely normal, you will receive your results only by: [place] (if you have MyChart) OR A paper copy in the mail If you have any lab test that is abnormal or we need to change your treatment, we will call you to review the results.   Testing/Procedures: Your physician has requested that you have an echocardiogram. Echocardiography is a painless test that uses sound waves to create images of your heart. It provides your doctor with information about the size and shape of your heart and how well your hearts chambers and valves are working. This procedure takes approximately one hour. There are no restrictions for this procedure.    Follow-Up: At [facility], you and your health needs are our priority.  As part of our continuing mission to provide you with exceptional heart care, we have created designated Provider Care Teams.  These Care Teams include your primary Cardiologist (physician) and Advanced Practice Providers (APPs -  Physician Assistants and Nurse Practitioners) who all work together to provide you with the care you need, when you need it.  We recommend signing up for the patient portal called "MyChart".  Sign up information is provided on this After Visit Summary.  MyChart is used to connect with patients for Virtual Visits (Telemedicine).  Patients are able to view lab/test results, encounter notes, upcoming appointments, etc.  Non-urgent messages can be sent to your provider as well.   To learn more about what you can do with MyChart, go to  [email or web address].    Your next appointment:   1 month(s)  The format for your next appointment:   In Person  Provider:   Jyl Heinz, MD   Other Instructions Echocardiogram An echocardiogram is a test that uses sound waves (ultrasound) to produce images of the heart. Images from an echocardiogram can provide important information about: Heart size and shape. The size and thickness and movement of your heart's walls. Heart muscle function and strength. Heart valve function or if you have stenosis. Stenosis is when the heart valves are too narrow. If blood is flowing backward through the heart valves (regurgitation). A tumor or infectious growth around the heart valves. Areas of heart muscle that are not working well because of poor blood flow or injury from a heart attack. Aneurysm detection. An aneurysm is a weak or damaged part of an artery wall. The wall bulges out from the normal force of blood pumping through the body. Tell a health care provider about: Any allergies you have. All medicines you are taking, including vitamins, herbs, eye drops, creams, and over-the-counter medicines. Any blood disorders you have. Any surgeries you have had. Any medical conditions you have. Whether you are pregnant or may be pregnant. What are the risks? Generally, this is a safe test. However, problems may occur, including an allergic reaction to dye (contrast) that may be used during the test. What happens before the test? No specific preparation is needed. You may eat and drink normally. What happens during the test? You will take off your clothes from the waist up and put on a hospital  gown. Electrodes or electrocardiogram (ECG)patches may be placed on your chest. The electrodes or patches are then connected to a device that monitors your heart rate and rhythm. You will lie down on a table for an ultrasound exam. A gel will be applied to your chest to help sound waves pass  through your skin. A handheld device, called a transducer, will be pressed against your chest and moved over your heart. The transducer produces sound waves that travel to your heart and bounce back (or "echo" back) to the transducer. These sound waves will be captured in real-time and changed into images of your heart that can be viewed on a video monitor. The images will be recorded on a computer and reviewed by your health care provider. You may be asked to change positions or hold your breath for a short time. This makes it easier to get different views or better views of your heart. In some cases, you may receive contrast through an IV in one of your veins. This can improve the quality of the pictures from your heart. The procedure may vary among health care providers and hospitals.   What can I expect after the test? You may return to your normal, everyday life, including diet, activities, and medicines, unless your health care provider tells you not to do that. Follow these instructions at home: It is up to you to get the results of your test. Ask your health care provider, or the department that is doing the test, when your results will be ready. Keep all follow-up visits. This is important. Summary An echocardiogram is a test that uses sound waves (ultrasound) to produce images of the heart. Images from an echocardiogram can provide important information about the size and shape of your heart, heart muscle function, heart valve function, and other possible heart problems. You do not need to do anything to prepare before this test. You may eat and drink normally. After the echocardiogram is completed, you may return to your normal, everyday life, unless your health care provider tells you not to do that. This information is not intended to replace advice given to you by your health care provider. Make sure you discuss any questions you have with your health care provider. Document Revised:  01/07/2020 Document Reviewed: 01/07/2020 Elsevier Patient Education  2021 [employer].

## 2021-07-09 NOTE — Progress Notes (Signed)
Cardiology Office Note:    Date:  07/09/2021   ID:  Raymond Fowler, DOB [birth year], MRN [id number]  PCP:  Orpah Melter, MD  Cardiologist:  Jenean Lindau, MD   Referring MD: Orpah Melter, MD    ASSESSMENT:    1. Essential hypertension   2. Pure hypercholesterolemia   3. Palpitations   4. Cardiac murmur   5. Supraventricular tachycardia ([place])   6. Diet-controlled diabetes mellitus ([place])    PLAN:    In order of problems listed above:  Primary prevention stressed with the patient.  Importance of compliance with diet medications stressed and she vocalized understanding. Narrow QRS tachycardia: Patient has never had such symptoms before.  Currently he is doing fine.  I would like to switch his medications.  I stopped lisinopril and will initiate metoprolol succinate 25 mg daily.  We will also get a TSH level done. Essential hypertension: Above-mentioned adjustments will help. Cardiac murmur: Echocardiogram will be done to assess murmur heard on auscultation. Diabetes mellitus: Diet controlled: Patient on ACE inhibitors.  Body and benefits risks especially in view of the fact that he needs medication for tachycardia I switched it.  I explained the rationale behind this to the patient and he and his wife vocalized understanding and questions were answered to their satisfaction. Patient will be seen in follow-up appointment in 4 weeks or earlier if the patient has any concerns    Medication Adjustments/Labs and Tests Ordered: Current medicines are reviewed at length with the patient today.  Concerns regarding medicines are outlined above.  Orders Placed This Encounter  Procedures   TSH   EKG 12-Lead   ECHOCARDIOGRAM COMPLETE   Meds ordered this encounter  Medications   metoprolol succinate (TOPROL XL) 25 MG 24 hr tablet    Sig: Take 1 tablet (25 mg total) by mouth daily.    Dispense:  30 tablet    Refill:  6     History of Present Illness:    Raymond Fowler is a 88 y.o.  male who is being seen today for the evaluation of palpitations and supraventricular tachycardia at the request of Orpah Melter, MD. patient is a pleasant 88 year old male.  He has past medical history of essential hypertension, diabetis mellitus which is diet controlled.  Patient mentions to me that he had palpitations and went to the emergency room.  In the emergency room he was treated and released.  Subsequently he is done fine.  No chest pain orthopnea or PND.  I reviewed emergency room records.  Past Medical History:  Diagnosis Date   Bilateral hearing loss 07/29/2015   Bilateral impacted cerumen 07/29/2015   Cerebrovascular disease 11/04/2016   Cochlear implant in place 02/24/2020   Diabetes mellitus without complication (HCC)    Dizziness and giddiness 11/04/2016   Essential hypertension 07/06/2021   Frailty 07/06/2021   Hardening of the aorta (main artery of the heart) ([place]) 07/06/2021   Hypertension    Osteoporosis 07/06/2021   Pathological fracture of vertebra 07/06/2021   Protein calorie malnutrition ([place]) 07/06/2021   Pure hypercholesterolemia 07/06/2021   Sensorineural hearing loss (SNHL) of both ears 02/27/2018   Type 2 diabetes mellitus with other circulatory complications ([place]) 02/03/2951   Wears hearing aid in right ear 03/10/2020    Past Surgical History:  Procedure Laterality Date   [place]  TONSILLECTOMY      Current Medications: Current Meds  Medication Sig   alendronate (FOSAMAX) 70 MG tablet Take 70 mg by mouth once a week.   Ascorbic Acid (VITAMIN C) 1000 MG tablet Take 1,000 mg by mouth daily.   aspirin EC 325 MG tablet Take 325 mg by mouth daily.   atorvastatin (LIPITOR) 20 MG tablet Take 20 mg by mouth every evening.   brimonidine (ALPHAGAN) 0.2 % ophthalmic solution Place 1 drop into both eyes 2 (two) times daily.   carboxymethylcellul-glycerin (REFRESH OPTIVE)  0.5-0.9 % ophthalmic solution Place 1 drop into both eyes 2 (two) times daily.   Chromium Picolinate 1000 MCG TABS Take 1,000 mg by mouth daily.   COVID-19 mRNA bivalent vaccine, Pfizer, (PFIZER COVID-19 VAC BIVALENT) injection Inject into the muscle.   cycloSPORINE (RESTASIS) 0.05 % ophthalmic emulsion Place 1 drop into both eyes 2 (two) times daily.   finasteride (PROSCAR) 5 MG tablet Take 5 mg by mouth daily.   meclizine (ANTIVERT) 25 MG tablet Take 1 tablet (25 mg total) by mouth 3 (three) times daily as needed for dizziness.   metoprolol succinate (TOPROL XL) 25 MG 24 hr tablet Take 1 tablet (25 mg total) by mouth daily.   Multiple Vitamins-Minerals (MULTIVITAMIN WITH MINERALS) tablet Take 1 tablet by mouth daily.   Omega-3 Fatty Acids (FISH OIL) 1200 MG CAPS Take 1,200 mg by mouth 2 (two) times daily.   ondansetron (ZOFRAN-ODT) 4 MG disintegrating tablet Take 1 tablet (4 mg total) by mouth every 8 (eight) hours as needed for nausea or vomiting.   silodosin (RAPAFLO) 8 MG CAPS capsule Take 8 mg by mouth daily.   [DISCONTINUED] lisinopril (PRINIVIL,ZESTRIL) 10 MG tablet Take 15 mg by mouth daily.     Allergies:   Patient has no known allergies.   Social History   Socioeconomic History   Marital status: Married    Spouse name: Mikle Bosworth   Number of children: 2   Years of education: Not on file   Highest education level: Not on file  Occupational History   Occupation: Retired  Tobacco Use   Smoking status: Never   Smokeless tobacco: Never  Vaping Use   Vaping Use: Never used  Substance and Sexual Activity   Alcohol use: Yes    Comment: rare   Drug use: No   Sexual activity: Not Currently  Other Topics Concern   Not on file  Social History Narrative   Lives with wife   Caffeine use: 4 cups daily   Right handed   Social Determinants of Health   Financial Resource Strain: Not on file  Food Insecurity: Not on file  Transportation Needs: Not on file  Physical Activity:  Not on file  Stress: Not on file  Social Connections: Not on file     Family History: The patient's family history includes Diabetes in his sister.  ROS:   Please see the history of present illness.    All other systems reviewed and are negative.  EKGs/Labs/Other Studies Reviewed:    The following studies were reviewed today: EKG reveals sinus rhythm with nonspecific ST-T changes   Recent Labs: 07/05/2021: ALT 27; BUN 23; Creatinine, Ser 1.27; Hemoglobin 14.9; Platelets 229; Potassium 4.7; Sodium 134  Recent Lipid Panel No results found for: CHOL, TRIG, HDL, CHOLHDL, VLDL, LDLCALC, LDLDIRECT  Physical Exam:    VS:  BP 110/62    Pulse 85    Ht 5\' 11"  (1.803 m)    Wt 120 lb  6.4 oz (54.6 kg)    SpO2 97%    BMI 16.79 kg/m     Wt Readings from Last 3 Encounters:  07/09/21 120 lb 6.4 oz (54.6 kg)  07/05/21 121 lb (54.9 kg)  02/18/21 121 lb (54.9 kg)     GEN: Patient is in no acute distress HEENT: Normal NECK: No JVD; No carotid bruits LYMPHATICS: No lymphadenopathy CARDIAC: S1 S2 regular, 2/6 systolic murmur at the apex. RESPIRATORY:  Clear to auscultation without rales, wheezing or rhonchi  ABDOMEN: Soft, non-tender, non-distended MUSCULOSKELETAL:  No edema; No deformity  SKIN: Warm and dry NEUROLOGIC:  Alert and oriented x 3 PSYCHIATRIC:  Normal affect    Signed, Jenean Lindau, MD  07/09/2021 3:25 PM    [facility]

## 2021-07-10 LAB — CULTURE, BLOOD (ROUTINE X 2)
Culture: NO GROWTH
Culture: NO GROWTH
Special Requests: ADEQUATE

## 2021-07-10 LAB — TSH: TSH: 5.84 u[IU]/mL — ABNORMAL HIGH (ref 0.450–4.500)

## 2021-07-11 ENCOUNTER — Inpatient Hospital Stay (HOSPITAL_BASED_OUTPATIENT_CLINIC_OR_DEPARTMENT_OTHER)
Admission: EM | Admit: 2021-07-11 | Discharge: 2021-07-15 | DRG: 177 | Disposition: A | Discharge status: Home / Self Care | Payer: Medicare Other | Attending: Internal Medicine | Admitting: Internal Medicine

## 2021-07-11 ENCOUNTER — Encounter (HOSPITAL_BASED_OUTPATIENT_CLINIC_OR_DEPARTMENT_OTHER): Payer: Self-pay

## 2021-07-11 ENCOUNTER — Emergency Department (HOSPITAL_BASED_OUTPATIENT_CLINIC_OR_DEPARTMENT_OTHER): Payer: Medicare Other

## 2021-07-11 ENCOUNTER — Other Ambulatory Visit: Payer: Self-pay

## 2021-07-11 DIAGNOSIS — H903 Sensorineural hearing loss, bilateral: Secondary | ICD-10-CM | POA: Diagnosis present

## 2021-07-11 DIAGNOSIS — I959 Hypotension, unspecified: Secondary | ICD-10-CM | POA: Diagnosis present

## 2021-07-11 DIAGNOSIS — E119 Type 2 diabetes mellitus without complications: Secondary | ICD-10-CM | POA: Diagnosis not present

## 2021-07-11 DIAGNOSIS — I110 Hypertensive heart disease with heart failure: Secondary | ICD-10-CM | POA: Diagnosis present

## 2021-07-11 DIAGNOSIS — Z7982 Long term (current) use of aspirin: Secondary | ICD-10-CM

## 2021-07-11 DIAGNOSIS — E871 Hypo-osmolality and hyponatremia: Secondary | ICD-10-CM | POA: Diagnosis not present

## 2021-07-11 DIAGNOSIS — I471 Supraventricular tachycardia, unspecified: Secondary | ICD-10-CM | POA: Diagnosis present

## 2021-07-11 DIAGNOSIS — E1159 Type 2 diabetes mellitus with other circulatory complications: Secondary | ICD-10-CM | POA: Diagnosis present

## 2021-07-11 DIAGNOSIS — E860 Dehydration: Secondary | ICD-10-CM | POA: Diagnosis present

## 2021-07-11 DIAGNOSIS — I679 Cerebrovascular disease, unspecified: Secondary | ICD-10-CM | POA: Diagnosis present

## 2021-07-11 DIAGNOSIS — Z79899 Other long term (current) drug therapy: Secondary | ICD-10-CM

## 2021-07-11 DIAGNOSIS — R64 Cachexia: Secondary | ICD-10-CM | POA: Diagnosis not present

## 2021-07-11 DIAGNOSIS — E038 Other specified hypothyroidism: Secondary | ICD-10-CM | POA: Diagnosis present

## 2021-07-11 DIAGNOSIS — I420 Dilated cardiomyopathy: Secondary | ICD-10-CM | POA: Diagnosis present

## 2021-07-11 DIAGNOSIS — I5020 Unspecified systolic (congestive) heart failure: Secondary | ICD-10-CM | POA: Diagnosis not present

## 2021-07-11 DIAGNOSIS — J029 Acute pharyngitis, unspecified: Secondary | ICD-10-CM | POA: Diagnosis not present

## 2021-07-11 DIAGNOSIS — E7800 Pure hypercholesterolemia, unspecified: Secondary | ICD-10-CM | POA: Diagnosis present

## 2021-07-11 DIAGNOSIS — N400 Enlarged prostate without lower urinary tract symptoms: Secondary | ICD-10-CM | POA: Diagnosis present

## 2021-07-11 DIAGNOSIS — N401 Enlarged prostate with lower urinary tract symptoms: Secondary | ICD-10-CM | POA: Diagnosis present

## 2021-07-11 DIAGNOSIS — M810 Age-related osteoporosis without current pathological fracture: Secondary | ICD-10-CM | POA: Diagnosis present

## 2021-07-11 DIAGNOSIS — Z681 Body mass index (BMI) 19 or less, adult: Secondary | ICD-10-CM | POA: Diagnosis not present

## 2021-07-11 DIAGNOSIS — D638 Anemia in other chronic diseases classified elsewhere: Secondary | ICD-10-CM | POA: Diagnosis present

## 2021-07-11 DIAGNOSIS — Z974 Presence of external hearing-aid: Secondary | ICD-10-CM

## 2021-07-11 DIAGNOSIS — N138 Other obstructive and reflux uropathy: Secondary | ICD-10-CM | POA: Diagnosis present

## 2021-07-11 DIAGNOSIS — Z9621 Cochlear implant status: Secondary | ICD-10-CM | POA: Diagnosis present

## 2021-07-11 DIAGNOSIS — H918X3 Other specified hearing loss, bilateral: Secondary | ICD-10-CM | POA: Diagnosis not present

## 2021-07-11 DIAGNOSIS — E1165 Type 2 diabetes mellitus with hyperglycemia: Secondary | ICD-10-CM | POA: Diagnosis present

## 2021-07-11 DIAGNOSIS — U071 COVID-19: Principal | ICD-10-CM

## 2021-07-11 DIAGNOSIS — E43 Unspecified severe protein-calorie malnutrition: Secondary | ICD-10-CM | POA: Diagnosis not present

## 2021-07-11 DIAGNOSIS — R0602 Shortness of breath: Secondary | ICD-10-CM

## 2021-07-11 DIAGNOSIS — I455 Other specified heart block: Secondary | ICD-10-CM | POA: Diagnosis present

## 2021-07-11 DIAGNOSIS — I517 Cardiomegaly: Secondary | ICD-10-CM | POA: Diagnosis not present

## 2021-07-11 DIAGNOSIS — D649 Anemia, unspecified: Secondary | ICD-10-CM

## 2021-07-11 DIAGNOSIS — R000 Tachycardia, unspecified: Secondary | ICD-10-CM

## 2021-07-11 DIAGNOSIS — R54 Age-related physical debility: Secondary | ICD-10-CM | POA: Diagnosis present

## 2021-07-11 DIAGNOSIS — Z833 Family history of diabetes mellitus: Secondary | ICD-10-CM

## 2021-07-11 DIAGNOSIS — H9193 Unspecified hearing loss, bilateral: Secondary | ICD-10-CM | POA: Diagnosis not present

## 2021-07-11 DIAGNOSIS — D696 Thrombocytopenia, unspecified: Secondary | ICD-10-CM | POA: Diagnosis present

## 2021-07-11 DIAGNOSIS — N342 Other urethritis: Secondary | ICD-10-CM | POA: Diagnosis present

## 2021-07-11 HISTORY — DX: Supraventricular tachycardia, unspecified: I47.10

## 2021-07-11 LAB — URINALYSIS, MICROSCOPIC (REFLEX)

## 2021-07-11 LAB — CBC WITH DIFFERENTIAL/PLATELET
Abs Immature Granulocytes: 0.01 10*3/uL (ref 0.00–0.07)
Basophils Absolute: 0 10*3/uL (ref 0.0–0.1)
Basophils Relative: 0 %
Eosinophils Absolute: 0 10*3/uL (ref 0.0–0.5)
Eosinophils Relative: 0 %
HCT: 38.8 % — ABNORMAL LOW (ref 39.0–52.0)
Hemoglobin: 13.2 g/dL (ref 13.0–17.0)
Immature Granulocytes: 0 %
Lymphocytes Relative: 15 %
Lymphs Abs: 1.1 10*3/uL (ref 0.7–4.0)
MCH: 30.7 pg (ref 26.0–34.0)
MCHC: 34 g/dL (ref 30.0–36.0)
MCV: 90.2 fL (ref 80.0–100.0)
Monocytes Absolute: 0.9 10*3/uL (ref 0.1–1.0)
Monocytes Relative: 12 %
Neutro Abs: 5.5 10*3/uL (ref 1.7–7.7)
Neutrophils Relative %: 73 %
Platelets: 182 10*3/uL (ref 150–400)
RBC: 4.3 MIL/uL (ref 4.22–5.81)
RDW: 13.7 % (ref 11.5–15.5)
WBC: 7.5 10*3/uL (ref 4.0–10.5)
nRBC: 0 % (ref 0.0–0.2)

## 2021-07-11 LAB — URINALYSIS, ROUTINE W REFLEX MICROSCOPIC
Bilirubin Urine: NEGATIVE
Glucose, UA: NEGATIVE mg/dL
Ketones, ur: NEGATIVE mg/dL
Nitrite: POSITIVE — AB
Protein, ur: NEGATIVE mg/dL
Specific Gravity, Urine: 1.015 (ref 1.005–1.030)
pH: 7 (ref 5.0–8.0)

## 2021-07-11 LAB — COMPREHENSIVE METABOLIC PANEL
ALT: 23 U/L (ref 0–44)
AST: 35 U/L (ref 15–41)
Albumin: 3.8 g/dL (ref 3.5–5.0)
Alkaline Phosphatase: 33 U/L — ABNORMAL LOW (ref 38–126)
Anion gap: 10 (ref 5–15)
BUN: 20 mg/dL (ref 8–23)
CO2: 24 mmol/L (ref 22–32)
Calcium: 8.3 mg/dL — ABNORMAL LOW (ref 8.9–10.3)
Chloride: 96 mmol/L — ABNORMAL LOW (ref 98–111)
Creatinine, Ser: 1.12 mg/dL (ref 0.61–1.24)
GFR, Estimated: >60 mL/min (ref >60)
Glucose, Bld: 148 mg/dL — ABNORMAL HIGH (ref 70–99)
Potassium: 4 mmol/L (ref 3.5–5.1)
Sodium: 130 mmol/L — ABNORMAL LOW (ref 135–145)
Total Bilirubin: 0.8 mg/dL (ref 0.3–1.2)
Total Protein: 7 g/dL (ref 6.5–8.1)

## 2021-07-11 LAB — LACTATE DEHYDROGENASE: LDH: 166 U/L (ref 98–192)

## 2021-07-11 LAB — OSMOLALITY, URINE: Osmolality, Ur: 264 mOsm/kg — ABNORMAL LOW (ref 300–900)

## 2021-07-11 LAB — SODIUM, URINE, RANDOM: Sodium, Ur: 74 mmol/L

## 2021-07-11 LAB — PROCALCITONIN: Procalcitonin: <0.1 ng/mL

## 2021-07-11 LAB — RESP PANEL BY RT-PCR (FLU A&B, COVID) ARPGX2
Influenza A by PCR: NEGATIVE
Influenza B by PCR: NEGATIVE
SARS Coronavirus 2 by RT PCR: POSITIVE — AB

## 2021-07-11 LAB — OSMOLALITY: Osmolality: 294 mOsm/kg (ref 275–295)

## 2021-07-11 LAB — GLUCOSE, CAPILLARY
Glucose-Capillary: 105 mg/dL — ABNORMAL HIGH (ref 70–99)
Glucose-Capillary: 179 mg/dL — ABNORMAL HIGH (ref 70–99)

## 2021-07-11 LAB — FERRITIN: Ferritin: 127 ng/mL (ref 24–336)

## 2021-07-11 LAB — HEMOGLOBIN A1C
Hgb A1c MFr Bld: 6.3 % — ABNORMAL HIGH (ref 4.8–5.6)
Mean Plasma Glucose: 134.11 mg/dL

## 2021-07-11 LAB — LACTIC ACID, PLASMA: Lactic Acid, Venous: 1.5 mmol/L (ref 0.5–1.9)

## 2021-07-11 LAB — FIBRINOGEN: Fibrinogen: 467 mg/dL (ref 210–475)

## 2021-07-11 LAB — GROUP A STREP BY PCR: Group A Strep by PCR: NOT DETECTED

## 2021-07-11 LAB — D-DIMER, QUANTITATIVE: D-Dimer, Quant: 1.13 ug/mL-FEU — ABNORMAL HIGH (ref 0.00–0.50)

## 2021-07-11 LAB — MAGNESIUM: Magnesium: 1.6 mg/dL — ABNORMAL LOW (ref 1.7–2.4)

## 2021-07-11 MED ORDER — FINASTERIDE 5 MG PO TABS
5.0000 mg | ORAL_TABLET | Freq: Every day | ORAL | Status: DC
  Administered 2021-07-11 – 2021-07-15 (×5): 5 mg via ORAL
  Filled 2021-07-11 (×5): qty 1

## 2021-07-11 MED ORDER — METOPROLOL SUCCINATE ER 50 MG PO TB24
50.0000 mg | ORAL_TABLET | Freq: Every day | ORAL | Status: DC
  Administered 2021-07-11 – 2021-07-15 (×5): 50 mg via ORAL
  Filled 2021-07-11 (×5): qty 1

## 2021-07-11 MED ORDER — AMIODARONE HCL IN DEXTROSE 360-4.14 MG/200ML-% IV SOLN
30.0000 mg/h | INTRAVENOUS | Status: DC
  Administered 2021-07-11 – 2021-07-13 (×5): 30 mg/h via INTRAVENOUS
  Filled 2021-07-11 (×8): qty 200

## 2021-07-11 MED ORDER — ONDANSETRON HCL 4 MG PO TABS: 4.0000 mg | ORAL_TABLET | Freq: Three times a day (TID) | ORAL | Status: DC | PRN

## 2021-07-11 MED ORDER — ENSURE ENLIVE PO LIQD
237.0000 mL | Freq: Two times a day (BID) | ORAL | Status: DC
  Administered 2021-07-12: 237 mL via ORAL

## 2021-07-11 MED ORDER — LACTATED RINGERS IV BOLUS
1000.0000 mL | Freq: Once | INTRAVENOUS | Status: AC
  Administered 2021-07-11: 1000 mL via INTRAVENOUS

## 2021-07-11 MED ORDER — ASPIRIN EC 325 MG PO TBEC
325.0000 mg | DELAYED_RELEASE_TABLET | Freq: Every day | ORAL | Status: DC
Start: 2021-07-11 — End: 2021-07-15
  Administered 2021-07-11 – 2021-07-15 (×5): 325 mg via ORAL
  Filled 2021-07-11 (×5): qty 1

## 2021-07-11 MED ORDER — MAGNESIUM SULFATE 2 GM/50ML IV SOLN
2.0000 g | Freq: Once | INTRAVENOUS | Status: AC
Start: 2021-07-11 — End: 2021-07-11
  Administered 2021-07-11: 2 g via INTRAVENOUS
  Filled 2021-07-11: qty 50

## 2021-07-11 MED ORDER — SODIUM CHLORIDE 0.9 % IV SOLN
200.0000 mg | Freq: Once | INTRAVENOUS | Status: AC
  Administered 2021-07-11: 200 mg via INTRAVENOUS
  Filled 2021-07-11: qty 40

## 2021-07-11 MED ORDER — CYCLOSPORINE 0.05 % OP EMUL
1.0000 [drp] | Freq: Two times a day (BID) | OPHTHALMIC | Status: DC
  Administered 2021-07-11 – 2021-07-15 (×8): 1 [drp] via OPHTHALMIC
  Filled 2021-07-11 (×9): qty 30

## 2021-07-11 MED ORDER — ADENOSINE 6 MG/2ML IV SOLN
INTRAVENOUS | Status: AC
  Administered 2021-07-11: 6 mg
  Filled 2021-07-11: qty 6

## 2021-07-11 MED ORDER — PHENOL 1.4 % MT LIQD: 1.0000 | OROMUCOSAL | Status: DC | PRN

## 2021-07-11 MED ORDER — CHLORHEXIDINE GLUCONATE CLOTH 2 % EX PADS
6.0000 | MEDICATED_PAD | Freq: Every day | CUTANEOUS | Status: DC
  Administered 2021-07-12 – 2021-07-14 (×3): 6 via TOPICAL

## 2021-07-11 MED ORDER — TAMSULOSIN HCL 0.4 MG PO CAPS
0.4000 mg | ORAL_CAPSULE | Freq: Every day | ORAL | Status: DC
  Administered 2021-07-11 – 2021-07-14 (×4): 0.4 mg via ORAL
  Filled 2021-07-11 (×4): qty 1

## 2021-07-11 MED ORDER — ATORVASTATIN CALCIUM 10 MG PO TABS
20.0000 mg | ORAL_TABLET | Freq: Every evening | ORAL | Status: DC
  Administered 2021-07-11 – 2021-07-14 (×4): 20 mg via ORAL
  Filled 2021-07-11 (×5): qty 2

## 2021-07-11 MED ORDER — AMIODARONE HCL IN DEXTROSE 360-4.14 MG/200ML-% IV SOLN
30.0000 mg/h | INTRAVENOUS | Status: AC
  Administered 2021-07-11: 30 mg/h via INTRAVENOUS
  Filled 2021-07-11: qty 200

## 2021-07-11 MED ORDER — MOLNUPIRAVIR EUA 200MG CAPSULE
4.0000 | ORAL_CAPSULE | Freq: Two times a day (BID) | ORAL | Status: DC
  Filled 2021-07-11: qty 4

## 2021-07-11 MED ORDER — MECLIZINE HCL 25 MG PO TABS: 25.0000 mg | ORAL_TABLET | Freq: Three times a day (TID) | ORAL | Status: DC | PRN

## 2021-07-11 MED ORDER — IPRATROPIUM BROMIDE HFA 17 MCG/ACT IN AERS
2.0000 | INHALATION_SPRAY | Freq: Four times a day (QID) | RESPIRATORY_TRACT | Status: DC | PRN
  Filled 2021-07-11: qty 12.9

## 2021-07-11 MED ORDER — POLYVINYL ALCOHOL 1.4 % OP SOLN
1.0000 [drp] | Freq: Two times a day (BID) | OPHTHALMIC | Status: DC
  Administered 2021-07-11 – 2021-07-15 (×8): 1 [drp] via OPHTHALMIC
  Filled 2021-07-11: qty 15

## 2021-07-11 MED ORDER — INSULIN ASPART 100 UNIT/ML IJ SOLN
0.0000 [IU] | Freq: Three times a day (TID) | INTRAMUSCULAR | Status: DC
  Administered 2021-07-12: 2 [IU] via SUBCUTANEOUS
  Administered 2021-07-12: 3 [IU] via SUBCUTANEOUS
  Administered 2021-07-12: 1 [IU] via SUBCUTANEOUS
  Administered 2021-07-13: 2 [IU] via SUBCUTANEOUS
  Administered 2021-07-13: 1 [IU] via SUBCUTANEOUS
  Administered 2021-07-13 – 2021-07-14 (×2): 2 [IU] via SUBCUTANEOUS
  Administered 2021-07-14 – 2021-07-15 (×3): 1 [IU] via SUBCUTANEOUS

## 2021-07-11 MED ORDER — ENOXAPARIN SODIUM 30 MG/0.3ML IJ SOSY
30.0000 mg | PREFILLED_SYRINGE | INTRAMUSCULAR | Status: DC
  Administered 2021-07-11 – 2021-07-13 (×3): 30 mg via SUBCUTANEOUS
  Filled 2021-07-11 (×3): qty 0.3

## 2021-07-11 MED ORDER — ADULT MULTIVITAMIN W/MINERALS CH
1.0000 | ORAL_TABLET | Freq: Every day | ORAL | Status: DC
  Administered 2021-07-11 – 2021-07-15 (×5): 1 via ORAL
  Filled 2021-07-11 (×5): qty 1

## 2021-07-11 MED ORDER — SODIUM CHLORIDE 0.9 % IV SOLN
100.0000 mg | Freq: Every day | INTRAVENOUS | Status: AC
  Administered 2021-07-12 – 2021-07-15 (×4): 100 mg via INTRAVENOUS
  Filled 2021-07-11: qty 100
  Filled 2021-07-11: qty 20
  Filled 2021-07-11: qty 100
  Filled 2021-07-11: qty 20

## 2021-07-11 MED ORDER — CHLORHEXIDINE GLUCONATE CLOTH 2 % EX PADS
6.0000 | MEDICATED_PAD | Freq: Every day | CUTANEOUS | Status: DC
  Administered 2021-07-11: 6 via TOPICAL

## 2021-07-11 MED ORDER — ASCORBIC ACID 500 MG PO TABS
1000.0000 mg | ORAL_TABLET | Freq: Every day | ORAL | Status: DC
  Administered 2021-07-11 – 2021-07-15 (×5): 1000 mg via ORAL
  Filled 2021-07-11 (×5): qty 2

## 2021-07-11 MED ORDER — BRIMONIDINE TARTRATE 0.2 % OP SOLN
1.0000 [drp] | Freq: Two times a day (BID) | OPHTHALMIC | Status: DC
  Administered 2021-07-11 – 2021-07-15 (×8): 1 [drp] via OPHTHALMIC
  Filled 2021-07-11: qty 5

## 2021-07-11 MED ORDER — LABETALOL HCL 200 MG PO TABS: 100.0000 mg | ORAL_TABLET | Freq: Four times a day (QID) | ORAL | Status: DC | PRN

## 2021-07-11 MED ORDER — ADENOSINE 6 MG/2ML IV SOLN: 6.0000 mg | Freq: Once | INTRAVENOUS | Status: AC

## 2021-07-11 NOTE — Consult Note (Signed)
Cardiology Consultation:   Patient ID: Raymond Fowler MRN: [id number]; DOB: Sep 15, 1932  Admit date: 07/11/2021 Date of Consult: 07/11/2021  PCP:  Orpah Melter, MD   [facility] Providers Cardiologist:  Jenean Lindau, MD        Patient Profile:   Raymond Fowler is a 88 y.o. male with a hx of HTN, HLD, palpitations, SVT and diet controlled DM, hearing deficit with cochlear implant, hx CVA who is being seen 07/11/2021 for the evaluation of SVT at the request of Dr. Lorin Mercy.  History of Present Illness:   Mr. Saiki with above hx. was seen in ER 07/05/21 with SVT.  He was symptomatic at that time with some lightheadedness and nausea and he could feel heart racing.  No chest pain. He was given IV bolus 500 CC NS and zofran. HR back to normal.  First episode of rapid HR.  He was seen by Dr. Geraldo Pitter 07/09/21 with recent SVT lisinopril stopped and metoprolol XL 25 added, TSH was 5.80.  Echo ordered, murmur heard  Today pt presented to med [facility] with sore throat (throat pain) and runny nose and intermittent lightheadedness.  Pt given IV amiodarone and to SR and his throat pain improved.  EKG:  The EKG was personally reviewed and demonstrates:  atrial  tach vs a flutter at 140, deep ST inversions in lat. Leads.   And LVH   Most recent SR with PACs and LVH  similar to EKG done 07/09/21  old Q waves ant Telemetry:  Telemetry was personally reviewed and demonstrates:  SR Na 130, K+ 4.0 Cr 1.12 Mg+ 1.6  Lactic acid 1.5 WBC 7.5, hgb 13.2, plts 182  COVID +.   Blood cultures pending.  PCXR:  IMPRESSION: No radiographic evidence of acute cardiopulmonary disease.    BP with rapid HR 87/57 but increased now 133/98   Past Medical History:  Diagnosis Date   Bilateral hearing loss 07/29/2015   Bilateral impacted cerumen 07/29/2015   Cerebrovascular disease 11/04/2016   Cochlear implant in place 02/24/2020   Diabetes mellitus without complication ([place])    Dizziness and giddiness 11/04/2016    Essential hypertension 07/06/2021   Frailty 07/06/2021   Hardening of the aorta (main artery of the heart) ([place]) 07/06/2021   Hypertension    Osteoporosis 07/06/2021   Pathological fracture of vertebra 07/06/2021   Protein calorie malnutrition ([place]) 07/06/2021   Pure hypercholesterolemia 07/06/2021   Sensorineural hearing loss (SNHL) of both ears 02/27/2018   Type 2 diabetes mellitus with other circulatory complications ([place]) 07/29/5174   Wears hearing aid in right ear 03/10/2020    Past Surgical History:  Procedure Laterality Date   APPENDECTOMY     CHOLECYSTECTOMY     COCHLEAR IMPLANT     EYE SURGERY     FRACTURE SURGERY     HERNIA REPAIR     TONSILLECTOMY       Home Medications:  Prior to Admission medications   Medication Sig Start Date End Date Taking? Authorizing Provider  alendronate (FOSAMAX) 70 MG tablet Take 70 mg by mouth once a week. 06/16/21   [provider]  Ascorbic Acid (VITAMIN C) 1000 MG tablet Take 1,000 mg by mouth daily.    [provider]  aspirin EC 325 MG tablet Take 325 mg by mouth daily.    [provider]  atorvastatin (LIPITOR) 20 MG tablet Take 20 mg by mouth every evening. 07/18/15   [provider]  brimonidine (ALPHAGAN) 0.2 % ophthalmic solution Place  1 drop into both eyes 2 (two) times daily. 05/29/15   [provider]  carboxymethylcellul-glycerin (REFRESH OPTIVE) 0.5-0.9 % ophthalmic solution Place 1 drop into both eyes 2 (two) times daily.    [provider]  Chromium Picolinate 1000 MCG TABS Take 1,000 mg by mouth daily.    [provider]  COVID-19 mRNA bivalent vaccine, Pfizer, (PFIZER COVID-19 VAC BIVALENT) injection Inject into the muscle. 03/25/21   Carlyle Basques, MD  cycloSPORINE (RESTASIS) 0.05 % ophthalmic emulsion Place 1 drop into both eyes 2 (two) times daily.    [provider]  finasteride (PROSCAR) 5 MG tablet Take 5 mg by mouth daily. 04/19/21   [provider]   meclizine (ANTIVERT) 25 MG tablet Take 1 tablet (25 mg total) by mouth 3 (three) times daily as needed for dizziness. 10/31/16   Pattricia Boss, MD  metoprolol succinate (TOPROL XL) 25 MG 24 hr tablet Take 1 tablet (25 mg total) by mouth daily. 07/09/21   Revankar, Reita Cliche, MD  Multiple Vitamins-Minerals (MULTIVITAMIN WITH MINERALS) tablet Take 1 tablet by mouth daily.    [provider]  Omega-3 Fatty Acids (FISH OIL) 1200 MG CAPS Take 1,200 mg by mouth 2 (two) times daily.    [provider]  ondansetron (ZOFRAN-ODT) 4 MG disintegrating tablet Take 1 tablet (4 mg total) by mouth every 8 (eight) hours as needed for nausea or vomiting. 07/05/21   Fredia Sorrow, MD  silodosin (RAPAFLO) 8 MG CAPS capsule Take 8 mg by mouth daily. 04/19/21   [provider]    Inpatient Medications: Scheduled Meds:  Continuous Infusions:  amiodarone 30 mg/hr (07/11/21 1105)   amiodarone     PRN Meds:   Allergies:   No Known Allergies  Social History:   Social History   Socioeconomic History   Marital status: Married    Spouse name: Raymond Fowler   Number of children: 2   Years of education: Not on file   Highest education level: Not on file  Occupational History   Occupation: Retired  Tobacco Use   Smoking status: Never   Smokeless tobacco: Never  Vaping Use   Vaping Use: Never used  Substance and Sexual Activity   Alcohol use: Yes    Comment: rare   Drug use: No   Sexual activity: Not Currently  Other Topics Concern   Not on file  Social History Narrative   Lives with wife   Caffeine use: 4 cups daily   Right handed   Social Determinants of Health   Financial Resource Strain: Not on file  Food Insecurity: Not on file  Transportation Needs: Not on file  Physical Activity: Not on file  Stress: Not on file  Social Connections: Not on file  Intimate Partner Violence: Not on file    Family History:    Family History  Problem Relation Age of Onset   Diabetes  Sister      ROS:  Please see the history of present illness.  General:no colds or fevers, no weight changes Skin:no rashes or ulcers HEENT:no blurred vision, no congestion CV:see HPI PUL:see HPI GI:no diarrhea constipation or melena, no indigestion GU:no hematuria, no dysuria MS:no joint pain, no claudication Neuro:no syncope, no lightheadedness Endo:+ diabetes, no thyroid disease  All other ROS reviewed and negative.     Physical Exam/Data:   Vitals:   07/11/21 1130 07/11/21 1200 07/11/21 1230 07/11/21 1451  BP: 123/80 127/69 (!) 149/80 132/90  Pulse: 65 61 63 (!) 133  Resp: 14 12 11  (!) 22  Temp:    98.5 F (36.9 C)  TempSrc:    Oral  SpO2: 100% 98% 100% 100%  Weight:      Height:        Intake/Output Summary (Last 24 hours) at 07/11/2021 1522 Last data filed at 07/11/2021 1451 Gross per 24 hour  Intake 1000 ml  Output 550 ml  Net 450 ml   Last 3 Weights 07/11/2021 07/09/2021 07/05/2021  Weight (lbs) 120 lb 120 lb 6.4 oz 121 lb  Weight (kg) 54.432 kg 54.613 kg 54.885 kg     Body mass index is 17.22 kg/m.  Exam per MD    Relevant CV Studies: Echo pending  Carotid dopplers 2018 with minimal 1-39% stenosis. Bilateral.      Laboratory Data:  High Sensitivity Troponin:   Recent Labs  Lab 07/05/21 1249  TROPONINIHS 10     Chemistry Recent Labs  Lab 07/05/21 1232 07/11/21 0936  NA 134* 130*  K 4.7 4.0  CL 97* 96*  CO2 28 24  GLUCOSE 162* 148*  BUN 23 20  CREATININE 1.27* 1.12  CALCIUM 9.7 8.3*  MG  --  1.6*  GFRNONAA 55* >60  ANIONGAP 9 10    Recent Labs  Lab 07/05/21 1232 07/11/21 0936  PROT 7.4 7.0  ALBUMIN 4.1 3.8  AST 32 35  ALT 27 23  ALKPHOS 34* 33*  BILITOT 0.6 0.8   Lipids No results for input(s): CHOL, TRIG, HDL, LABVLDL, LDLCALC, CHOLHDL in the last 168 hours.  Hematology Recent Labs  Lab 07/05/21 1232 07/11/21 0936  WBC 7.4 7.5  RBC 4.91 4.30  HGB 14.9 13.2  HCT 44.2 38.8*  MCV 90.0 90.2  MCH 30.3 30.7  MCHC 33.7  34.0  RDW 13.3 13.7  PLT 229 182   Thyroid  Recent Labs  Lab 07/09/21 1531  TSH 5.840*    BNPNo results for input(s): BNP, PROBNP in the last 168 hours.  DDimer No results for input(s): DDIMER in the last 168 hours.   Radiology/Studies:  DG Chest Portable 1 View  Result Date: 07/11/2021 CLINICAL DATA:  88 year old male with history of lightheadedness. Tachycardia. EXAM: PORTABLE CHEST 1 VIEW COMPARISON:  Chest x-ray 07/05/2021. FINDINGS: Lung volumes are normal. No consolidative airspace disease. No pleural effusions. No pneumothorax. Tiny calcified granulomas are noted in the right mid to upper lung and left lung base. No other suspicious appearing pulmonary nodule or mass noted. Pulmonary vasculature and the cardiomediastinal silhouette are within normal limits. IMPRESSION: No radiographic evidence of acute cardiopulmonary disease. Electronically Signed   By: Vinnie Langton M.D.   On: 07/11/2021 11:10     Assessment and Plan:   SVT possible Atrial flutter with RVR. Placed on IV amiodarone and now SR with PACs.    Echo ordered. Pt on BB as outpt.  Could continue.  + COVID today he was neg 07/05/21 with prior episode of SVT.   DM-2 diet controlled per IM and check A1C.  Hx of coronary artery and aortic calcifications on CTA of chest in 01/2021.  07/05/21 hs Troponin was 10 will check echo.  Concern if SVT ischemic mediated.  Hypomag.  1.6 will replace.   With coronary calcifications will check lipid status.    Risk Assessment/Risk Scores:                For questions or updates, please contact [facility] Please consult [email or web address] for contact info under    Signed, Mickel Baas  Dorene Ar, NP  07/11/2021 3:22 PM

## 2021-07-11 NOTE — ED Provider Notes (Signed)
[facility] EMERGENCY DEPARTMENT Provider Note   CSN: [id number] Arrival date & time: 07/11/21  5361     History  Chief Complaint  Patient presents with   Sore Throat   Nasal Congestion   Dizziness    Raymond Fowler is a 88 y.o. male.  HPI     88 year old male with history of diabetes, hypertension, hypercholesterolemia, hearing loss with cochlear implant, narrow QRS tachycardia seen in the emergency department for this lightheadedness/nausea, followed up with cardiology, who presents with concern for sore throat/congestion for 6 days but also notes continuing symptoms of lightheadedness and palpitations.   Presents with sore throat since Tuesday Congestion, head cold Worse sore throat than what he has ever had Had flu and covid swab on Monday and that was negative--was because of nausea/abdominal pain but sore throat did not begin until the day after that-nausea and abd pain resolved.  Wife began coughing some last evening  Lightheaded and heart racing episodes, has been seen here several times for that, saw Dr. Geraldo Pitter on 2/10, this has been intermittent for a long time and is also present today  No known fever, nausea, vomiting (since Monday), diarrhea (is always constipated) Cough once in a while  No shortness of breath, no chest pain, no leg pain or swelling, no nausea, vomiting (since Monday),  no black or bloody stools   Past Medical History:  Diagnosis Date   Bilateral hearing loss 07/29/2015   Bilateral impacted cerumen 07/29/2015   Cerebrovascular disease 11/04/2016   Cochlear implant in place 02/24/2020   Diabetes mellitus without complication ([place])    Dizziness and giddiness 11/04/2016   Essential hypertension 07/06/2021   Frailty 07/06/2021   Hardening of the aorta (main artery of the heart) ([place]) 07/06/2021   Hypertension    Osteoporosis 07/06/2021   Pathological fracture of vertebra 07/06/2021   Protein calorie malnutrition ([place]) 07/06/2021   Pure  hypercholesterolemia 07/06/2021   Sensorineural hearing loss (SNHL) of both ears 02/27/2018   Type 2 diabetes mellitus with other circulatory complications ([place]) 08/31/3152   Wears hearing aid in right ear 03/10/2020    Past Surgical History:  Procedure Laterality Date   APPENDECTOMY     CHOLECYSTECTOMY     COCHLEAR IMPLANT     EYE [place] Medications Prior to Admission medications   Medication Sig Start Date End Date Taking? Authorizing Provider  alendronate (FOSAMAX) 70 MG tablet Take 70 mg by mouth once a week. 06/16/21   [provider]  Ascorbic Acid (VITAMIN C) 1000 MG tablet Take 1,000 mg by mouth daily.    [provider]  aspirin EC 325 MG tablet Take 325 mg by mouth daily.    [provider]  atorvastatin (LIPITOR) 20 MG tablet Take 20 mg by mouth every evening. 07/18/15   [provider]  brimonidine (ALPHAGAN) 0.2 % ophthalmic solution Place 1 drop into both eyes 2 (two) times daily. 05/29/15   [provider]  carboxymethylcellul-glycerin (REFRESH OPTIVE) 0.5-0.9 % ophthalmic solution Place 1 drop into both eyes 2 (two) times daily.    [provider]  Chromium Picolinate 1000 MCG TABS Take 1,000 mg by mouth daily.    [provider]  COVID-19 mRNA bivalent vaccine, Pfizer, (PFIZER COVID-19 VAC BIVALENT) injection Inject into the muscle. 03/25/21   Carlyle Basques, MD  cycloSPORINE (RESTASIS) 0.05 % ophthalmic emulsion  Place 1 drop into both eyes 2 (two) times daily.    [provider]  finasteride (PROSCAR) 5 MG tablet Take 5 mg by mouth daily. 04/19/21   [provider]  meclizine (ANTIVERT) 25 MG tablet Take 1 tablet (25 mg total) by mouth 3 (three) times daily as needed for dizziness. 10/31/16   Pattricia Boss, MD  metoprolol succinate (TOPROL XL) 25 MG 24 hr tablet Take 1 tablet (25 mg total) by mouth daily. 07/09/21   Revankar, Reita Cliche, MD  Multiple Vitamins-Minerals (MULTIVITAMIN WITH MINERALS) tablet Take 1 tablet by mouth daily.    [provider]  Omega-3 Fatty Acids (FISH OIL) 1200 MG CAPS Take 1,200 mg by mouth 2 (two) times daily.    [provider]  ondansetron (ZOFRAN-ODT) 4 MG disintegrating tablet Take 1 tablet (4 mg total) by mouth every 8 (eight) hours as needed for nausea or vomiting. 07/05/21   Fredia Sorrow, MD  silodosin (RAPAFLO) 8 MG CAPS capsule Take 8 mg by mouth daily. 04/19/21   [provider]      Allergies    Patient has no known allergies.    Review of Systems   Review of Systems See above  Physical Exam Updated Vital Signs BP (!) 125/99    Pulse (!) 133    Temp 98.5 F (36.9 C) (Oral)    Resp 17    Ht 5\' 10"  (1.778 m)    Wt 54.4 kg    SpO2 99%    BMI 17.22 kg/m  Physical Exam Vitals and nursing note reviewed.  Constitutional:      General: He is not in acute distress.    Appearance: He is well-developed. He is not diaphoretic.  HENT:     Head: Normocephalic and atraumatic.     Mouth/Throat:     Mouth: Mucous membranes are moist.     Tonsils: No tonsillar exudate or tonsillar abscesses.  Eyes:     Conjunctiva/sclera: Conjunctivae normal.  Cardiovascular:     Rate and Rhythm: Regular rhythm. Tachycardia present.     Heart sounds: Normal heart sounds. No murmur heard.   No friction rub. No gallop.  Pulmonary:     Effort: Pulmonary effort is normal. No respiratory distress.     Breath sounds: Normal breath sounds. No wheezing or rales.  Abdominal:     General: There is no distension.     Palpations: Abdomen is soft.     Tenderness: There is no abdominal tenderness. There is no guarding.  Musculoskeletal:     Cervical back: Normal range of motion.  Skin:    General: Skin is warm and dry.  Neurological:     Mental Status: He is alert and oriented to person, place, and time.    ED Results / Procedures / Treatments   Labs (all labs ordered are  listed, but only abnormal results are displayed) Labs Reviewed  RESP PANEL BY RT-PCR (FLU A&B, COVID) ARPGX2 - Abnormal; Notable for the following components:      Result Value   SARS Coronavirus 2 by RT PCR POSITIVE (*)    All other components within normal limits  CBC WITH DIFFERENTIAL/PLATELET - Abnormal; Notable for the following components:   HCT 38.8 (*)    All other components within normal limits  COMPREHENSIVE METABOLIC PANEL - Abnormal; Notable for the following components:   Sodium 130 (*)    Chloride 96 (*)    Glucose, Bld 148 (*)    Calcium 8.3 (*)  Alkaline Phosphatase 33 (*)    All other components within normal limits  MAGNESIUM - Abnormal; Notable for the following components:   Magnesium 1.6 (*)    All other components within normal limits  GROUP A STREP BY PCR  URINE CULTURE  CULTURE, BLOOD (ROUTINE X 2)  CULTURE, BLOOD (ROUTINE X 2)  LACTIC ACID, PLASMA  URINALYSIS, ROUTINE W REFLEX MICROSCOPIC    EKG EKG Interpretation  Date/Time:  Sunday July 11 2021 09:41:53 EST Ventricular Rate:  81 PR Interval:  178 QRS Duration: 83 QT Interval:  346 QTC Calculation: 402 R Axis:   29 Text Interpretation: Sinus rhythm Supraventricular bigeminy LVH with secondary repolarization abnormality Probable anterior infarct, age indeterminate Since prior ECG< rhythm is now sinus Confirmed by Gareth Morgan [id number]) on 07/11/2021 11:49:10 AM  Radiology DG Chest Portable 1 View  Result Date: 07/11/2021 CLINICAL DATA:  88 year old male with history of lightheadedness. Tachycardia. EXAM: PORTABLE CHEST 1 VIEW COMPARISON:  Chest x-ray 07/05/2021. FINDINGS: Lung volumes are normal. No consolidative airspace disease. No pleural effusions. No pneumothorax. Tiny calcified granulomas are noted in the right mid to upper lung and left lung base. No other suspicious appearing pulmonary nodule or mass noted. Pulmonary vasculature and the cardiomediastinal silhouette are within normal  limits. IMPRESSION: No radiographic evidence of acute cardiopulmonary disease. Electronically Signed   By: Vinnie Langton M.D.   On: 07/11/2021 11:10    Procedures Procedures    Medications Ordered in ED Medications  amiodarone (NEXTERONE PREMIX) 360-4.14 MG/200ML-% (1.8 mg/mL) IV infusion (30 mg/hr Intravenous New Bag/Given 07/11/21 1105)  amiodarone (NEXTERONE PREMIX) 360-4.14 MG/200ML-% (1.8 mg/mL) IV infusion (has no administration in time range)  lactated ringers bolus 1,000 mL (0 mLs Intravenous Stopped 07/11/21 1451)    ED Course/ Medical Decision Making/ A&P                           Medical Decision Making Amount and/or Complexity of Data Reviewed Labs: ordered. Radiology: ordered.  Risk Prescription drug management. Decision regarding hospitalization.    88 year old male with history of diabetes, hypertension, hypercholesterolemia, hearing loss with cochlear implant, narrow QRS tachycardia seen in the emergency department for this lightheadedness/nausea, followed up with cardiology, who presents with concern for sore throat/congestion for 6 days but also notes continuing symptoms of lightheadedness and palpitations.   On arrival to the emergency department both his heart rate and blood pressures are fluctuating.  He is noted to be in a supraventricular tachycardia with a rate in the 140s, and blood pressure down to 16W systolic, followed by spontaneous conversion to a normal sinus rhythm with improvement of blood pressures to 109 systolic.  It does appear that the fluctuation in his blood pressures is dependent on his heart rhythm.  Given hypotension, did order blood cultures, urinalysis, CXR, lactic acid although clinically have low suspicion for sepsis.  He has not had fever, change in mentation, and other than having sore throat and congestion and episodes of lightheadedness and heart racing that he has had over several months, he does not have symptoms.  Given he has no  chest pain or shortness of breath I have low suspicion for pulmonary embolus, ACS. It is possible also he has some dehydration in setting of sore throat and was given IV fluids. He is not having symptoms of bleeding, or abdominal pain to suggest other etiologies of hypotension.   Suspect hypotension secondary to cardiac arrhythmia given above.  Discussed with Cardiology,  Dr. Quentin Ore. Feel it is most likely SVT although possibility of other supraventricular arrhythmia.  Given his blood pressures cannot tolerate other rate control medications, will initiate amiodarone at 30 mg/hr.    After initiation of amiodarone, HR improved to sinus rhythm, blood pressures improved.  Regarding sore throat--no sign of retropharyngeal nasal abscess, epiglottitis, peritonsillar abscess.  Strep testing is negative.  COVID-19 testing returned and was positive.  Given he is on day 6 of symptoms, he is not a candidate for oral medications at this time.  Will admit to the hospitalist service with cardiology consult for tachyarrhythmia.         Final Clinical Impression(s) / ED Diagnoses Final diagnoses:  SVT (supraventricular tachycardia) (HCC)  Hypotension, unspecified hypotension type  COVID-19    Rx / DC Orders ED Discharge Orders     None         Gareth Morgan, MD 07/11/21 1540

## 2021-07-11 NOTE — ED Notes (Signed)
Pt in bed, pt reports that his pain is better than when he arrived in the ER, pt does report 6/10 throat pain, pt appears to be in an a fib like rhythm on the monitor.

## 2021-07-11 NOTE — Progress Notes (Signed)
Plan of Care Note for accepted transfer   Patient: Raymond Fowler MRN: [id number]   DOA: 07/11/2021  Facility requesting transfer: [facility] Requesting Provider: Billy Fischer Reason for transfer: [place] course: Concern for mixed complex tachycardia, SVT vs. Flutter.  Dr. Quentin Ore consulted.  Sensitive to the HR changes.  On Amiodarone drip with some improvement, HR in 60s and in NSR.  Has COVID - day 6 of symptoms.  Cardiology will see upon arrival at [facility].  Plan of care: The patient is accepted for admission to Progressive unit, at [facility].    Author: Karmen Bongo, MD 07/11/2021  Check [email or web address] for on-call coverage.  Nursing staff, Please call [place] number on Amion as soon as patient's arrival, so appropriate admitting provider can evaluate the pt.

## 2021-07-11 NOTE — Progress Notes (Signed)
Called to come administer adenosine to this patient d/t suspected SVT vs flutter. Patient received 6 mg IV. Positive response from patient. HR reset into NSR. Patient was on Zoll monitoring during the medication administration and cardiology MD present at bedside.

## 2021-07-11 NOTE — ED Notes (Signed)
Pt in bed, iv placed L forearm, blood culture number one drawn, blood culture number two drawn via 23 gauge butterfly from R hand.

## 2021-07-11 NOTE — Progress Notes (Signed)
Spoke with patient's wife, she states that patient has foley catheter changed by [facility] and it is due to be changed this week.  Dr. Roosevelt Locks notified catheter placed by urology.

## 2021-07-11 NOTE — ED Triage Notes (Signed)
PT reports he began to have sore throat and a runny nose that began on 2/7. Was seen here recently, and tested negative for covid/flu at that time. Also reports intermittent lightheadedness. Denies SOB or CP.

## 2021-07-11 NOTE — Progress Notes (Signed)
Patient arrived at 1613hrs.  ST with HR in 130s on arrival, amiodarone infusing at 91mcg/h.  Patient has indwelling urinary catheter on arrival to leg bag, switched to standard foley bag.  Patient extremely HOH and unable to answer questions at this time.  Will await wife to complete admission assessment.

## 2021-07-11 NOTE — H&P (Addendum)
History and Physical    Raymond Fowler ZOX:[id number] DOB: 1932/08/10 DOA: 07/11/2021  PCP: Orpah Melter, MD (Confirm with patient/family/NH records and if not entered, this has to be entered at [facility] point of entry) Patient coming from: Home  I have personally briefly reviewed patient's old medical records in [place]  Chief Complaint: Palpitations, sore throat, feeling dizzy  HPI: Raymond Fowler is a 88 y.o. male with medical history significant of HTN, chronic urethritis obstruction secondary to BPH and indwelling Foley catheter, extreme hard of hearing, chronic dizziness osteoporosis, protein calorie malnutrition, IIDM, came with worsening of sore throat, palpitations and feeling dizzy.  Symptoms started about 6 days ago, with episode of feeling dizzy and feeling nausea.  ED work-up at that point showed that the patient borderline tachycardia, and patient was found dehydrated.  After given hydration, heart rate normalized and patient started to feel better, and then patient was discharged home.  In the next few days, patient continued to experience frequent episodes of lightheadedness and palpitations and frequent feeling of nausea but no vomiting.  And today, patient is feeling severe sore throat, no chest pain, no shortness of breath.  ED Course: Patient was found to be in SVT.  After 6 mg of IV adenosine, EKG showed AVNRT.  Chest x-ray negative for infiltrates.  COVID-positive.  Patient was started on amiodarone drip.  Review of Systems: Unable to perform, extremely hard of hearing.  Past Medical History:  Diagnosis Date   Bilateral hearing loss 07/29/2015   Bilateral impacted cerumen 07/29/2015   Cerebrovascular disease 11/04/2016   Cochlear implant in place 02/24/2020   Diabetes mellitus without complication ([place])    Dizziness and giddiness 11/04/2016   Essential hypertension 07/06/2021   Frailty 07/06/2021   Hardening of the aorta (main artery of the heart) ([place]) 07/06/2021    Hypertension    Osteoporosis 07/06/2021   Pathological fracture of vertebra 07/06/2021   Protein calorie malnutrition ([place]) 07/06/2021   Pure hypercholesterolemia 07/06/2021   Sensorineural hearing loss (SNHL) of both ears 02/27/2018   Type 2 diabetes mellitus with other circulatory complications ([place]) 12/28/1912   Wears hearing aid in right ear 03/10/2020    Past Surgical History:  Procedure Laterality Date   APPENDECTOMY     [place]       reports that he has never smoked. He has never used smokeless tobacco. He reports current alcohol use. He reports that he does not use drugs.  No Known Allergies  Family History  Problem Relation Age of Onset   Diabetes Sister      Prior to Admission medications   Medication Sig Start Date End Date Taking? Authorizing Provider  alendronate (FOSAMAX) 70 MG tablet Take 70 mg by mouth once a week. 06/16/21   [provider]  Ascorbic Acid (VITAMIN C) 1000 MG tablet Take 1,000 mg by mouth daily.    [provider]  aspirin EC 325 MG tablet Take 325 mg by mouth daily.    [provider]  atorvastatin (LIPITOR) 20 MG tablet Take 20 mg by mouth every evening. 07/18/15   [provider]  brimonidine (ALPHAGAN) 0.2 % ophthalmic solution Place 1 drop into both eyes 2 (two) times daily. 05/29/15   [provider]  carboxymethylcellul-glycerin (REFRESH OPTIVE) 0.5-0.9 % ophthalmic solution Place 1 drop into both eyes  2 (two) times daily.    [provider]  Chromium Picolinate 1000 MCG TABS Take 1,000 mg by mouth daily.    [provider]  COVID-19 mRNA bivalent vaccine, Pfizer, (PFIZER COVID-19 VAC BIVALENT) injection Inject into the muscle. 03/25/21   Carlyle Basques, MD  cycloSPORINE (RESTASIS) 0.05 % ophthalmic emulsion Place 1 drop into both eyes 2 (two) times daily.    [provider]   finasteride (PROSCAR) 5 MG tablet Take 5 mg by mouth daily. 04/19/21   [provider]  meclizine (ANTIVERT) 25 MG tablet Take 1 tablet (25 mg total) by mouth 3 (three) times daily as needed for dizziness. 10/31/16   Pattricia Boss, MD  metoprolol succinate (TOPROL XL) 25 MG 24 hr tablet Take 1 tablet (25 mg total) by mouth daily. 07/09/21   Revankar, Reita Cliche, MD  Multiple Vitamins-Minerals (MULTIVITAMIN WITH MINERALS) tablet Take 1 tablet by mouth daily.    [provider]  Omega-3 Fatty Acids (FISH OIL) 1200 MG CAPS Take 1,200 mg by mouth 2 (two) times daily.    [provider]  ondansetron (ZOFRAN-ODT) 4 MG disintegrating tablet Take 1 tablet (4 mg total) by mouth every 8 (eight) hours as needed for nausea or vomiting. 07/05/21   Fredia Sorrow, MD  silodosin (RAPAFLO) 8 MG CAPS capsule Take 8 mg by mouth daily. 04/19/21   [provider]    Physical Exam: Vitals:   07/11/21 1500 07/11/21 1613 07/11/21 1616 07/11/21 1631  BP: (!) 125/99 (!) 153/108 (!) 169/101 (!) 166/99  Pulse: (!) 133 (!) 103 (!) 130 (!) 128  Resp: 17 18  18   Temp:  98.8 F (37.1 C)    TempSrc:  Oral    SpO2: 99% 99% 100% 99%  Weight:  52 kg    Height:  5\' 10"  (1.778 m)      Constitutional: NAD, calm, comfortable Vitals:   07/11/21 1500 07/11/21 1613 07/11/21 1616 07/11/21 1631  BP: (!) 125/99 (!) 153/108 (!) 169/101 (!) 166/99  Pulse: (!) 133 (!) 103 (!) 130 (!) 128  Resp: 17 18  18   Temp:  98.8 F (37.1 C)    TempSrc:  Oral    SpO2: 99% 99% 100% 99%  Weight:  52 kg    Height:  5\' 10"  (1.778 m)     Eyes: PERRL, lids and conjunctivae normal ENMT: Mucous membranes are moist. Posterior pharynx clear of any exudate or lesions.Normal dentition.  Neck: normal, supple, no masses, no thyromegaly Respiratory: clear to auscultation bilaterally, no wheezing, no crackles. Normal respiratory effort. No accessory muscle use.  Cardiovascular: Regular rate and rhythm, no murmurs / rubs  / gallops. No extremity edema. 2+ pedal pulses. No carotid bruits.  Abdomen: no tenderness, no masses palpated. No hepatosplenomegaly. Bowel sounds positive.  Musculoskeletal: no clubbing / cyanosis. No joint deformity upper and lower extremities. Good ROM, no contractures. Normal muscle tone.  Skin: no rashes, lesions, ulcers. No induration Neurologic: CN 2-12 grossly intact. Sensation intact, DTR normal. Strength 5/5 in all 4.  Psychiatric: Normal judgment and insight. Alert and oriented x 3. Normal mood.     Labs on Admission: I have personally reviewed following labs and imaging studies  CBC: Recent Labs  Lab 07/05/21 1232 07/11/21 0936  WBC 7.4 7.5  NEUTROABS 5.2 5.5  HGB 14.9 13.2  HCT 44.2 38.8*  MCV 90.0 90.2  PLT 229 588   Basic Metabolic Panel: Recent Labs  Lab 07/05/21 1232 07/11/21 0936  NA 134* 130*  K 4.7 4.0  CL 97* 96*  CO2 28 24  GLUCOSE 162* 148*  BUN 23 20  CREATININE 1.27* 1.12  CALCIUM 9.7 8.3*  MG  --  1.6*   GFR: Estimated Creatinine Clearance: 34.2 mL/min (by C-G formula based on SCr of 1.12 mg/dL). Liver Function Tests: Recent Labs  Lab 07/05/21 1232 07/11/21 0936  AST 32 35  ALT 27 23  ALKPHOS 34* 33*  BILITOT 0.6 0.8  PROT 7.4 7.0  ALBUMIN 4.1 3.8   Recent Labs  Lab 07/05/21 1232  LIPASE 40   No results for input(s): AMMONIA in the last 168 hours. Coagulation Profile: No results for input(s): INR, PROTIME in the last 168 hours. Cardiac Enzymes: No results for input(s): CKTOTAL, CKMB, CKMBINDEX, TROPONINI in the last 168 hours. BNP (last 3 results) No results for input(s): PROBNP in the last 8760 hours. HbA1C: No results for input(s): HGBA1C in the last 72 hours. CBG: No results for input(s): GLUCAP in the last 168 hours. Lipid Profile: No results for input(s): CHOL, HDL, LDLCALC, TRIG, CHOLHDL, LDLDIRECT in the last 72 hours. Thyroid Function Tests: Recent Labs    07/09/21 1531  TSH 5.840*   Anemia Panel: No  results for input(s): VITAMINB12, FOLATE, FERRITIN, TIBC, IRON, RETICCTPCT in the last 72 hours. Urine analysis:    Component Value Date/Time   COLORURINE YELLOW 07/11/2021 1519   APPEARANCEUR HAZY (A) 07/11/2021 1519   LABSPEC 1.015 07/11/2021 1519   PHURINE 7.0 07/11/2021 1519   GLUCOSEU NEGATIVE 07/11/2021 1519   HGBUR SMALL (A) 07/11/2021 1519   BILIRUBINUR NEGATIVE 07/11/2021 1519   KETONESUR NEGATIVE 07/11/2021 1519   PROTEINUR NEGATIVE 07/11/2021 1519   NITRITE POSITIVE (A) 07/11/2021 1519   LEUKOCYTESUR MODERATE (A) 07/11/2021 1519    Radiological Exams on Admission: DG Chest Portable 1 View  Result Date: 07/11/2021 CLINICAL DATA:  88 year old male with history of lightheadedness. Tachycardia. EXAM: PORTABLE CHEST 1 VIEW COMPARISON:  Chest x-ray 07/05/2021. FINDINGS: Lung volumes are normal. No consolidative airspace disease. No pleural effusions. No pneumothorax. Tiny calcified granulomas are noted in the right mid to upper lung and left lung base. No other suspicious appearing pulmonary nodule or mass noted. Pulmonary vasculature and the cardiomediastinal silhouette are within normal limits. IMPRESSION: No radiographic evidence of acute cardiopulmonary disease. Electronically Signed   By: Vinnie Langton M.D.   On: 07/11/2021 11:10    EKG: Independently reviewed. SVT  Assessment/Plan Principal Problem:   Tachycardia, unspecified Active Problems:   SVT (supraventricular tachycardia) (HCC)  (please populate well all problems here in Problem List. (For example, if patient is on BP meds at home and you resume or decide to hold them, it is a problem that needs to be her. Same for CAD, COPD, HLD and so on)  Symptomatic SVT -On amiodarone drip, managed by cardiology. -TSH pending -Etiology considered related to COVID 19 vaccine.  6 days ago when patient first presented, his COVID test was negative.  Today positive.  Discussed with patient's wife over the phone, agreed with  remdesivir. -Check COVID labs Including D-dimer. -Increase home dose of beta-blocker from 25 daily to 50 mg daily.  Hypomagnesia -IV replacement, recheck level tomorrow -Check phosphorus level  COVID-19 infection -Remdesivir, COVID lab, no symptoms or signs of COVID-pneumonia, hold off steroid.  Hyponatremia -Euvolemic, check TSH, check hyponatremia study.  Severe protein calorie malnutrition -Start supplement, consult dietitian  Indwelling Foley catheter -Secondary to obstructive uropathy from BPH, continue Foley.  BPH -Continue Flomax and finasteride  CODE  STATUS -Discussed with wife, full code, patient does not wish to live on artificial means including respirator to prolong life if in terminal conditions.   DVT prophylaxis: Lovenox Code Status: Full code Family Communication: Wife over the phone Disposition Plan: Expect less than 2 midnight hospital stay Consults called: Cardiology Admission status: PCU   Lequita Halt MD [facility] Pager [phone]  07/11/2021, 5:29 PM

## 2021-07-11 NOTE — ED Notes (Signed)
Attempted to call report, RN not available 

## 2021-07-11 NOTE — ED Notes (Signed)
ED TO INPATIENT HANDOFF REPORT  ED Nurse Name and Phone #: Salvatore Decent Name/Age/Gender Raymond Fowler 88 y.o. male Room/Bed: MH11/MH11  Code Status   Code Status: Not on file  Home/SNF/Other Home Patient oriented to: self, place, and time Is this baseline? Yes   Triage Complete: Triage complete  Chief Complaint Tachycardia, unspecified [R00.0]  Triage Note PT reports he began to have sore throat and a runny nose that began on 2/7. Was seen here recently, and tested negative for covid/flu at that time. Also reports intermittent lightheadedness. Denies SOB or CP.   Allergies No Known Allergies  Level of Care/Admitting Diagnosis ED Disposition     ED Disposition  Admit   Condition  --   Comment  Hospital Area: [facility] [[id number]]  Level of Care: Progressive [102]  Admit to Progressive based on following criteria: CARDIOVASCULAR & THORACIC of moderate stability with acute coronary syndrome symptoms/low risk myocardial infarction/hypertensive urgency/arrhythmias/heart failure potentially compromising stability and stable post cardiovascular intervention patients.  Interfacility transfer: Yes  May place patient in observation at [facility] or [facility] if equivalent level of care is available:: No  Covid Evaluation: Confirmed COVID Positive  Diagnosis: Tachycardia, unspecified [329.0.ICD-9-CM]  Admitting Physician: Karmen Bongo [[id number]]  Attending Physician: Karmen Bongo [[id number]]          B Medical/Surgery History Past Medical History:  Diagnosis Date   Bilateral hearing loss 07/29/2015   Bilateral impacted cerumen 07/29/2015   Cerebrovascular disease 11/04/2016   Cochlear implant in place 02/24/2020   Diabetes mellitus without complication ([place])    Dizziness and giddiness 11/04/2016   Essential hypertension 07/06/2021   Frailty 07/06/2021   Hardening of the aorta (main artery of the heart) ([place]) 07/06/2021   Hypertension    Osteoporosis 07/06/2021    Pathological fracture of vertebra 07/06/2021   Protein calorie malnutrition ([place]) 07/06/2021   Pure hypercholesterolemia 07/06/2021   Sensorineural hearing loss (SNHL) of both ears 02/27/2018   Type 2 diabetes mellitus with other circulatory complications ([place]) 01/30/4267   Wears hearing aid in right ear 03/10/2020   Past Surgical History:  Procedure Laterality Date   [place]       A IV Location/Drains/Wounds Patient Lines/Drains/Airways Status     Active Line/Drains/Airways     Name Placement date Placement time Site Days   Peripheral IV 07/11/21 20 G Anterior;Right Forearm 07/11/21  0945  Forearm  less than 1   Peripheral IV 07/11/21 20 G Anterior;Left Forearm 07/11/21  1035  Forearm  less than 1            Intake/Output Last 24 hours  Intake/Output Summary (Last 24 hours) at 07/11/2021 1506 Last data filed at 07/11/2021 1451 Gross per 24 hour  Intake 1000 ml  Output 550 ml  Net 450 ml    Labs/Imaging Results for orders placed or performed during the hospital encounter of 07/11/21 (from the past 48 hour(s))  CBC with Differential     Status: Abnormal   Collection Time: 07/11/21  9:36 AM  Result Value Ref Range   WBC 7.5 4.0 - 10.5 K/uL   RBC 4.30 4.22 - 5.81 MIL/uL   Hemoglobin 13.2 13.0 - 17.0 g/dL   HCT 38.8 (L) 39.0 - 52.0 %   MCV 90.2 80.0 - 100.0  fL   MCH 30.7 26.0 - 34.0 pg   MCHC 34.0 30.0 - 36.0 g/dL   RDW 13.7 11.5 - 15.5 %   Platelets 182 150 - 400 K/uL   nRBC 0.0 0.0 - 0.2 %   Neutrophils Relative % 73 %   Neutro Abs 5.5 1.7 - 7.7 K/uL   Lymphocytes Relative 15 %   Lymphs Abs 1.1 0.7 - 4.0 K/uL   Monocytes Relative 12 %   Monocytes Absolute 0.9 0.1 - 1.0 K/uL   Eosinophils Relative 0 %   Eosinophils Absolute 0.0 0.0 - 0.5 K/uL   Basophils Relative 0 %   Basophils Absolute 0.0 0.0 - 0.1 K/uL   Immature Granulocytes 0 %   Abs  Immature Granulocytes 0.01 0.00 - 0.07 K/uL    Comment: Performed at [facility], [address] [place], [place] [address]  Comprehensive metabolic panel     Status: Abnormal   Collection Time: 07/11/21  9:36 AM  Result Value Ref Range   Sodium 130 (L) 135 - 145 mmol/L   Potassium 4.0 3.5 - 5.1 mmol/L   Chloride 96 (L) 98 - 111 mmol/L   CO2 24 22 - 32 mmol/L   Glucose, Bld 148 (H) 70 - 99 mg/dL    Comment: Glucose reference range applies only to samples taken after fasting for at least 8 hours.   BUN 20 8 - 23 mg/dL   Creatinine, Ser 1.12 0.61 - 1.24 mg/dL   Calcium 8.3 (L) 8.9 - 10.3 mg/dL   Total Protein 7.0 6.5 - 8.1 g/dL   Albumin 3.8 3.5 - 5.0 g/dL   AST 35 15 - 41 U/L   ALT 23 0 - 44 U/L   Alkaline Phosphatase 33 (L) 38 - 126 U/L   Total Bilirubin 0.8 0.3 - 1.2 mg/dL   GFR, Estimated >60 >60 mL/min    Comment: (NOTE) Calculated using the CKD-EPI Creatinine Equation (2021)    Anion gap 10 5 - 15    Comment: Performed at [facility], [address] [place], [place] [address]  Magnesium     Status: Abnormal   Collection Time: 07/11/21  9:36 AM  Result Value Ref Range   Magnesium 1.6 (L) 1.7 - 2.4 mg/dL    Comment: Performed at [facility], [address] [place], [place] [address]  Resp Panel by RT-PCR (Flu A&B, Covid) Nasopharyngeal Swab     Status: Abnormal   Collection Time: 07/11/21 10:20 AM   Specimen: Nasopharyngeal Swab; Nasopharyngeal(NP) swabs in vial transport medium  Result Value Ref Range   SARS Coronavirus 2 by RT PCR POSITIVE (A) NEGATIVE    Comment: (NOTE) SARS-CoV-2 target nucleic acids are DETECTED.  The SARS-CoV-2 RNA is generally detectable in upper respiratory specimens during the acute phase of infection. Positive results are indicative of the presence of the identified virus, but do not rule out bacterial infection or co-infection with other pathogens not detected by the test. Clinical correlation with patient  history and other diagnostic information is necessary to determine patient infection status. The expected result is Negative.  Fact Sheet for Patients: EntrepreneurPulse.com.au  Fact Sheet for Healthcare Providers: IncredibleEmployment.be  This test is not yet approved or cleared by the [place] FDA and  has been authorized for detection and/or diagnosis of SARS-CoV-2 by FDA under an Emergency Use Authorization (EUA).  This EUA will remain in effect (meaning this test can be used) for the duration  of  the COVID-19 declaration under Section 564(b)(1) of the A ct, 21 U.S.C. section 360bbb-3(b)(1), unless the authorization is terminated or revoked sooner.     Influenza A by PCR NEGATIVE NEGATIVE   Influenza B by PCR NEGATIVE NEGATIVE    Comment: (NOTE) The Xpert Xpress SARS-CoV-2/FLU/RSV plus assay is intended as an aid in the diagnosis of influenza from Nasopharyngeal swab specimens and should not be used as a sole basis for treatment. Nasal washings and aspirates are unacceptable for Xpert Xpress SARS-CoV-2/FLU/RSV testing.  Fact Sheet for Patients: EntrepreneurPulse.com.au  Fact Sheet for Healthcare Providers: IncredibleEmployment.be  This test is not yet approved or cleared by the [place] FDA and has been authorized for detection and/or diagnosis of SARS-CoV-2 by FDA under an Emergency Use Authorization (EUA). This EUA will remain in effect (meaning this test can be used) for the duration of the COVID-19 declaration under Section 564(b)(1) of the Act, 21 U.S.C. section 360bbb-3(b)(1), unless the authorization is terminated or revoked.  Performed at [facility], [address] [place], [place] [address]   Group A Strep by PCR     Status: None   Collection Time: 07/11/21 10:20 AM   Specimen: Nasopharyngeal Swab; Sterile Swab  Result Value Ref Range   Group A Strep by PCR NOT  DETECTED NOT DETECTED    Comment: Performed at [facility], [address] [place], [place] [address]  Lactic acid, plasma     Status: None   Collection Time: 07/11/21 10:30 AM  Result Value Ref Range   Lactic Acid, Venous 1.5 0.5 - 1.9 mmol/L    Comment: Performed at [facility], [address] [place], [place] [address]   DG Chest Portable 1 View  Result Date: 07/11/2021 CLINICAL DATA:  88 year old male with history of lightheadedness. Tachycardia. EXAM: PORTABLE CHEST 1 VIEW COMPARISON:  Chest x-ray 07/05/2021. FINDINGS: Lung volumes are normal. No consolidative airspace disease. No pleural effusions. No pneumothorax. Tiny calcified granulomas are noted in the right mid to upper lung and left lung base. No other suspicious appearing pulmonary nodule or mass noted. Pulmonary vasculature and the cardiomediastinal silhouette are within normal limits. IMPRESSION: No radiographic evidence of acute cardiopulmonary disease. Electronically Signed   By: Vinnie Langton M.D.   On: 07/11/2021 11:10    Pending Labs Unresulted Labs (From admission, onward)     Start     Ordered   07/11/21 1004  Urinalysis, Routine w reflex microscopic  Once,   STAT        07/11/21 1007   07/11/21 1004  Urine Culture  Once,   STAT       Question:  Indication  Answer:  Dysuria   07/11/21 1007   07/11/21 1004  Blood culture (routine x 2)  BLOOD CULTURE X 2,   STAT      07/11/21 1007            Vitals/Pain Today's Vitals   07/11/21 1130 07/11/21 1200 07/11/21 1230 07/11/21 1451  BP: 123/80 127/69 (!) 149/80 132/90  Pulse: 65 61 63 (!) 133  Resp: 14 12 11  (!) 22  Temp:    98.5 F (36.9 C)  TempSrc:    Oral  SpO2: 100% 98% 100% 100%  Weight:      Height:      PainSc:        Isolation Precautions No active isolations  Medications Medications  amiodarone (NEXTERONE PREMIX) 360-4.14 MG/200ML-% (1.8 mg/mL)  IV infusion (30 mg/hr Intravenous New Bag/Given 07/11/21 1105)   amiodarone (NEXTERONE PREMIX) 360-4.14 MG/200ML-% (1.8 mg/mL) IV infusion (has no administration in time range)  lactated ringers bolus 1,000 mL (0 mLs Intravenous Stopped 07/11/21 1451)    Mobility walks with device Low fall risk      R Recommendations: See Admitting Provider Note  Report given to:   Additional Notes: Pt is very hard of hearing

## 2021-07-11 NOTE — ED Notes (Signed)
Pt in bed, pt oriented, pt very hard of hearing, pt on cardiac monitor, pt appears in an a fib like rhythm, pt then appears to have broken and was in a sinus rhythm with some ectopy, pt reports that he is here for headache and congestion, states that he also feels light headed, pt moving all extremities, equal strength bilaterally

## 2021-07-11 NOTE — ED Notes (Signed)
ED Provider at bedside. 

## 2021-07-11 NOTE — Progress Notes (Signed)
°   07/11/21 1613  Assess: MEWS Score  Temp 98.8 F (37.1 C)  BP (!) 153/108  Pulse Rate (!) 103  ECG Heart Rate (!) 138  Resp 18  Level of Consciousness Alert  SpO2 99 %  O2 Device Room Air  Assess: MEWS Score  MEWS Temp 0  MEWS Systolic 0  MEWS Pulse 3  MEWS RR 0  MEWS LOC 0  MEWS Score 3  MEWS Score Color Yellow  Assess: if the MEWS score is Yellow or Red  Were vital signs taken at a resting state? Yes  Focused Assessment Change from prior assessment (see assessment flowsheet)  Early Detection of Sepsis Score *See Row Information* Low  MEWS guidelines implemented *See Row Information* Yes  Treat  MEWS Interventions Administered scheduled meds/treatments  Pain Scale 0-10  Pain Score 0  Take Vital Signs  Increase Vital Sign Frequency  Yellow: Q 2hr X 2 then Q 4hr X 2, if remains yellow, continue Q 4hrs  Escalate  MEWS: Escalate Yellow: discuss with charge nurse/RN and consider discussing with provider and RRT  Notify: Charge Nurse/RN  Name of Charge Nurse/RN Notified Sarah RN  Date Charge Nurse/RN Notified 07/11/21  Time Charge Nurse/RN Notified 1620  Notify: Provider  Provider Name/Title Dr. Roosevelt Locks  Date Provider Notified 07/11/21  Time Provider Notified 1620  Notification Type Page  Notification Reason Other (Comment) (just arrived from [facility] ED)  Provider response At bedside  Date of Provider Response 07/11/21  Time of Provider Response 1700  Document  Patient Outcome Other (Comment) (remains stable)  Progress note created (see row info) Yes

## 2021-07-12 DIAGNOSIS — U071 COVID-19: Secondary | ICD-10-CM

## 2021-07-12 DIAGNOSIS — E43 Unspecified severe protein-calorie malnutrition: Secondary | ICD-10-CM

## 2021-07-12 DIAGNOSIS — H9193 Unspecified hearing loss, bilateral: Secondary | ICD-10-CM

## 2021-07-12 DIAGNOSIS — J029 Acute pharyngitis, unspecified: Secondary | ICD-10-CM | POA: Diagnosis present

## 2021-07-12 DIAGNOSIS — Z681 Body mass index (BMI) 19 or less, adult: Secondary | ICD-10-CM | POA: Diagnosis not present

## 2021-07-12 DIAGNOSIS — I5020 Unspecified systolic (congestive) heart failure: Secondary | ICD-10-CM | POA: Diagnosis present

## 2021-07-12 DIAGNOSIS — R000 Tachycardia, unspecified: Secondary | ICD-10-CM | POA: Diagnosis not present

## 2021-07-12 DIAGNOSIS — E119 Type 2 diabetes mellitus without complications: Secondary | ICD-10-CM

## 2021-07-12 DIAGNOSIS — E871 Hypo-osmolality and hyponatremia: Secondary | ICD-10-CM | POA: Diagnosis present

## 2021-07-12 DIAGNOSIS — D696 Thrombocytopenia, unspecified: Secondary | ICD-10-CM | POA: Diagnosis present

## 2021-07-12 DIAGNOSIS — R0602 Shortness of breath: Secondary | ICD-10-CM | POA: Diagnosis not present

## 2021-07-12 DIAGNOSIS — N400 Enlarged prostate without lower urinary tract symptoms: Secondary | ICD-10-CM | POA: Diagnosis present

## 2021-07-12 DIAGNOSIS — I959 Hypotension, unspecified: Secondary | ICD-10-CM | POA: Diagnosis present

## 2021-07-12 DIAGNOSIS — I420 Dilated cardiomyopathy: Secondary | ICD-10-CM | POA: Diagnosis present

## 2021-07-12 DIAGNOSIS — D638 Anemia in other chronic diseases classified elsewhere: Secondary | ICD-10-CM | POA: Diagnosis present

## 2021-07-12 DIAGNOSIS — H903 Sensorineural hearing loss, bilateral: Secondary | ICD-10-CM | POA: Diagnosis present

## 2021-07-12 DIAGNOSIS — E1165 Type 2 diabetes mellitus with hyperglycemia: Secondary | ICD-10-CM | POA: Diagnosis present

## 2021-07-12 DIAGNOSIS — M810 Age-related osteoporosis without current pathological fracture: Secondary | ICD-10-CM | POA: Diagnosis present

## 2021-07-12 DIAGNOSIS — E860 Dehydration: Secondary | ICD-10-CM | POA: Diagnosis present

## 2021-07-12 DIAGNOSIS — I110 Hypertensive heart disease with heart failure: Secondary | ICD-10-CM | POA: Diagnosis present

## 2021-07-12 DIAGNOSIS — I455 Other specified heart block: Secondary | ICD-10-CM | POA: Diagnosis present

## 2021-07-12 DIAGNOSIS — E1159 Type 2 diabetes mellitus with other circulatory complications: Secondary | ICD-10-CM | POA: Diagnosis present

## 2021-07-12 DIAGNOSIS — D649 Anemia, unspecified: Secondary | ICD-10-CM | POA: Diagnosis not present

## 2021-07-12 DIAGNOSIS — R64 Cachexia: Secondary | ICD-10-CM | POA: Diagnosis present

## 2021-07-12 DIAGNOSIS — I679 Cerebrovascular disease, unspecified: Secondary | ICD-10-CM | POA: Diagnosis present

## 2021-07-12 DIAGNOSIS — I517 Cardiomegaly: Secondary | ICD-10-CM | POA: Diagnosis not present

## 2021-07-12 DIAGNOSIS — E038 Other specified hypothyroidism: Secondary | ICD-10-CM | POA: Diagnosis present

## 2021-07-12 DIAGNOSIS — N138 Other obstructive and reflux uropathy: Secondary | ICD-10-CM | POA: Diagnosis present

## 2021-07-12 DIAGNOSIS — E7800 Pure hypercholesterolemia, unspecified: Secondary | ICD-10-CM | POA: Diagnosis present

## 2021-07-12 DIAGNOSIS — H918X3 Other specified hearing loss, bilateral: Secondary | ICD-10-CM | POA: Diagnosis not present

## 2021-07-12 DIAGNOSIS — I471 Supraventricular tachycardia: Secondary | ICD-10-CM | POA: Diagnosis present

## 2021-07-12 HISTORY — DX: Thrombocytopenia, unspecified: D69.6

## 2021-07-12 HISTORY — DX: Hypo-osmolality and hyponatremia: E87.1

## 2021-07-12 HISTORY — DX: Benign prostatic hyperplasia without lower urinary tract symptoms: N40.0

## 2021-07-12 HISTORY — DX: COVID-19: U07.1

## 2021-07-12 HISTORY — DX: Hypomagnesemia: E83.42

## 2021-07-12 HISTORY — DX: Anemia, unspecified: D64.9

## 2021-07-12 HISTORY — DX: Unspecified severe protein-calorie malnutrition: E43

## 2021-07-12 LAB — C-REACTIVE PROTEIN: CRP: 3.3 mg/dL — ABNORMAL HIGH (ref <1.0)

## 2021-07-12 LAB — CBC WITH DIFFERENTIAL/PLATELET
Abs Immature Granulocytes: 0.02 10*3/uL (ref 0.00–0.07)
Basophils Absolute: 0 10*3/uL (ref 0.0–0.1)
Basophils Relative: 0 %
Eosinophils Absolute: 0 10*3/uL (ref 0.0–0.5)
Eosinophils Relative: 0 %
HCT: 34.6 % — ABNORMAL LOW (ref 39.0–52.0)
Hemoglobin: 11.9 g/dL — ABNORMAL LOW (ref 13.0–17.0)
Immature Granulocytes: 0 %
Lymphocytes Relative: 18 %
Lymphs Abs: 1.1 10*3/uL (ref 0.7–4.0)
MCH: 30.7 pg (ref 26.0–34.0)
MCHC: 34.4 g/dL (ref 30.0–36.0)
MCV: 89.2 fL (ref 80.0–100.0)
Monocytes Absolute: 0.7 10*3/uL (ref 0.1–1.0)
Monocytes Relative: 12 %
Neutro Abs: 4.2 10*3/uL (ref 1.7–7.7)
Neutrophils Relative %: 70 %
Platelets: 135 10*3/uL — ABNORMAL LOW (ref 150–400)
RBC: 3.88 MIL/uL — ABNORMAL LOW (ref 4.22–5.81)
RDW: 13.5 % (ref 11.5–15.5)
WBC: 6.1 10*3/uL (ref 4.0–10.5)
nRBC: 0 % (ref 0.0–0.2)

## 2021-07-12 LAB — MAGNESIUM: Magnesium: 2 mg/dL (ref 1.7–2.4)

## 2021-07-12 LAB — BASIC METABOLIC PANEL
Anion gap: 9 (ref 5–15)
BUN: 22 mg/dL (ref 8–23)
CO2: 22 mmol/L (ref 22–32)
Calcium: 8 mg/dL — ABNORMAL LOW (ref 8.9–10.3)
Chloride: 101 mmol/L (ref 98–111)
Creatinine, Ser: 1.12 mg/dL (ref 0.61–1.24)
GFR, Estimated: >60 mL/min (ref >60)
Glucose, Bld: 121 mg/dL — ABNORMAL HIGH (ref 70–99)
Potassium: 3.9 mmol/L (ref 3.5–5.1)
Sodium: 132 mmol/L — ABNORMAL LOW (ref 135–145)

## 2021-07-12 LAB — GLUCOSE, CAPILLARY
Glucose-Capillary: 127 mg/dL — ABNORMAL HIGH (ref 70–99)
Glucose-Capillary: 164 mg/dL — ABNORMAL HIGH (ref 70–99)
Glucose-Capillary: 229 mg/dL — ABNORMAL HIGH (ref 70–99)
Glucose-Capillary: 110 mg/dL — ABNORMAL HIGH (ref 70–99)

## 2021-07-12 LAB — LIPID PANEL
Cholesterol: 109 mg/dL (ref 0–200)
HDL: 53 mg/dL (ref >40)
LDL Cholesterol: 48 mg/dL (ref 0–99)
Total CHOL/HDL Ratio: 2.1 RATIO
Triglycerides: 39 mg/dL (ref <150)
VLDL: 8 mg/dL (ref 0–40)

## 2021-07-12 LAB — TSH: TSH: 10.609 u[IU]/mL — ABNORMAL HIGH (ref 0.350–4.500)

## 2021-07-12 LAB — D-DIMER, QUANTITATIVE: D-Dimer, Quant: 0.85 ug/mL-FEU — ABNORMAL HIGH (ref 0.00–0.50)

## 2021-07-12 LAB — PHOSPHORUS: Phosphorus: 3 mg/dL (ref 2.5–4.6)

## 2021-07-12 LAB — FERRITIN: Ferritin: 147 ng/mL (ref 24–336)

## 2021-07-12 MED ORDER — ENSURE ENLIVE PO LIQD
237.0000 mL | Freq: Three times a day (TID) | ORAL | Status: DC
  Administered 2021-07-12 – 2021-07-15 (×8): 237 mL via ORAL

## 2021-07-12 NOTE — Assessment & Plan Note (Addendum)
Anemia panel with iron 30, TIBC 252, ferritin 133, folate 25.5, vitamin B12 1141; etiology likely secondary to anemia of chronic medical disease.  Hemoglobin stable, 11.9.

## 2021-07-12 NOTE — Consult Note (Signed)
[place] Nurse Consult Note: Reason for Consult:Right upper arm skin tear, present on admission.   Wound type:trauma, recent fall Pressure Injury POA: NA Measurement: 3.2 cm x 0.3 cm x 0.1 cm Wound bed:not visisble Drainage (amount, consistency, odor) minimal serosanguinous  no odor.  Periwound: dry skin Dressing procedure/placement/frequency: Cleanse skin tear to right arm with NS and pat dry. Apply Xeroform gauze.  Wrap with kerlix and tape.  Change Mon/Wed/Fri.  Will not follow at this time.  Please re-consult if needed.  Domenic Moras MSN, RN, FNP-BC CWON Wound, Ostomy, Continence Nurse Pager [phone]

## 2021-07-12 NOTE — Assessment & Plan Note (Addendum)
Diet controlled at home.  Hemoglobin A1c 6.3 on 07/11/2021, well controlled.

## 2021-07-12 NOTE — Assessment & Plan Note (Addendum)
Patient presenting to ED with recurrent palpitations and was found to be in SVT.  Patient was given 6 mg IV adenosine x2 with termination of rhythm.  Cardiology was consulted and patient was initially started on amiodarone drip.  TTE with LVEF 30-35%, LV moderately decreased function, LV with regional wall motion abnormalities, grade 1 diastolic dysfunction, LA mildly dilated, mild MR, no aortic stenosis, IVC dilated.  Cardiology was consulted and followed during hospital course.  Patient was started on amiodarone and will transition to 400 mg p.o. twice daily for additional 5 days following hospitalization followed by 40 mg p.o. daily.  Also will continue metoprolol succinate 50 mg p.o. daily.  Outpatient follow-up with cardiology in 2 weeks.

## 2021-07-12 NOTE — Assessment & Plan Note (Addendum)
COVID-19 PCR positive on admission, incidental finding.  Chest x-ray with no acute cardiopulmonary disease process and oxygenating well on room air.  Completed 5-day course of IV remdesivir while inpatient.

## 2021-07-12 NOTE — Progress Notes (Signed)
Progress Note   Patient: Sandra Tellefsen OYD:[id number] DOB: Jun 24, 1932 DOA: 07/11/2021     0 DOS: the patient was seen and examined on 07/12/2021   Brief hospital course: The patient is an 88 year old elderly cachectic and frail-appearing Caucasian male with a past medical history significant for but not limited to hypertension, chronic urethritis obstruction secondary to BPH, indwelling Foley catheter, extremely hard of hearing, chronic dizziness, osteoporosis, protein calorie malnutrition, insulin-dependent diabetes mellitus type 2 as well as other comorbidities who presents with worsening sore throat, palpitations and feeling dizzy.  Symptoms started about 6 days ago when he started feeling dizzy and nauseous and so he presented to the ED at that time and he is found to be borderline tachycardic and found to be dehydrated.  After hydration his heart rate normalized and patient started to feel better and then he was discharged home.  Next few days he continued to experience frequent episodes of lightheadedness and palpitations and frequent feeling of nausea but no vomiting.  Today he started feeling sore throat and had no chest pain or shortness of breath came in because of palpitations.  He was found to be in SVT and was given 6 mg of IV adenosine.  Repeat EKG after that showed AVNRT.  Chest x-ray was negative for infiltrates and he was incidentally tested call for COVID disease.  EP cardiology was consulted and recommended an amiodarone drip.  And he remains on amiodarone drip currently.  As he tested positive for COVID he was initiated on remdesivir and inflammatory markers were checked.  Currently given that he has no signs or symptoms of pneumonia we will holding off steroids  Assessment and Plan: * SVT (supraventricular tachycardia) ([place])- (present on admission) - Presented to the ED with palpitations and was given a 6 mg of IV adenosine and after the 6 mg of IV adenosine this terminated the rhythm in  the range of 100 within AV nodal block and cardiology feels that this is a AVNRT.  Cardiology recommended placing on amiodarone drip and he is on 30 mg IV amiodarone. -TSH was done -Echocardiogram has been ordered -Etiology could be related to COVID-19 vaccine however he did test positive for COVID today  -Inflammatory markers were checked -His home dose of beta-blocker increased from 25 mg p.o. daily to 50 mg p.o. daily -Cardiology recommends keeping magnesium above 2 and potassium above 4 -Further care per cardiology  Normocytic anemia -Patient's hemoglobin/hematocrit went from 13.2/38.8 and is now 11.9/34.6 -Check anemia panel in the a.m. -Continue to monitor for signs or symptoms of bleeding; no overt bleeding noted -Repeat CBC in a.m.  Thrombocytopenia ([place]) -Patient's Platelet Count went from 182 -> 135 -Continue to Monitor for S/Sx of Bleeding; No overt bleeding noted -Repeat CBC in the AM   Hyponatremia - Mild and improving.  Patient's sodium went from 130s now 132 -Continue monitor and trend and repeat CMP in a.m.  COVID-19 virus infection - Accidentally tested positive for COVID-19 disease -Initiated on remdesivir -Check inflammatory markers -SpO2: 97 % - Recent Labs    07/11/21 1757 07/11/21 2031 07/12/21 0240  DDIMER  --  1.13* 0.85*  FERRITIN 127  --  147  LDH 166  --   --   CRP  --   --  3.3*    Lab Results  Component Value Date   SARSCOV2NAA POSITIVE (A) 07/11/2021   [place] NEGATIVE 07/05/2021  -Airborne and contact precautions -Continue with ipratropium inhaler 2 puffs IH every 6 as needed shortness  of breath as well as a sorbic acid 1000 mg daily -X-ray showed no evidence of pneumonia -Continue to monitor respiratory status carefully  BPH (benign prostatic hyperplasia) -Continue with finasteride 5 mg p.o. daily as well as tamsulosin 0.4 mg p.o. daily  Hypomagnesemia -Mag Level Level was 1.6 and improved to 2.0 -Replete with IV Mag Sulfate 2  grams -Continue to Monitor and Replete as Necessary   Protein-calorie malnutrition, severe -Nutrition Status: Nutrition Problem: Severe Malnutrition Etiology: social / environmental circumstances (inadequate energy intake) Signs/Symptoms: severe fat depletion, severe muscle depletion Interventions: Refer to RD note for recommendations    Diet-controlled diabetes mellitus ([place]) -CBGs ranged from 127-229 -Continue with sensitive NovoLog/scale insulin AC  Bilateral hearing loss- (present on admission) -Extremely HOH   Nutrition Documentation    [facility] to Hosp-Admission (Current) from 07/11/2021 in [facility]  Nutrition Problem Severe Malnutrition  Etiology social / environmental circumstances  [inadequate energy intake]  Nutrition Goal Patient will meet greater than or equal to 90% of their needs  Interventions Refer to RD note for recommendations      Subjective: Seen and examined at bedside patient is extremely hard of hearing but he states that he is doing okay and denies any pain.  No complaints currently.  No other concerns or complaints at this time  Physical Exam: Vitals:   07/12/21 0436 07/12/21 0740 07/12/21 1040 07/12/21 1600  BP: 120/70 128/76 122/67   Pulse: (!) 57 (!) 59 (!) 57   Resp: 20     Temp: 99 F (37.2 C) 98.1 F (36.7 C)    TempSrc: Oral Oral    SpO2: 97% 98% 97%   Weight:    56.2 kg  Height:       Examination: Physical Exam:  Constitutional: Thin elderly cachectic Caucasian male currently no acute distress appears calm ENMT: External Ears, Nose appear normal.  He is extremely hard of hearing Respiratory: Diminished to auscultation bilaterally, no wheezing, rales, rhonchi or crackles. Normal respiratory effort and patient is not tachypenic. No accessory muscle use.  Not wearing supplemental oxygen via nasal cannula Cardiovascular: Mildly irregular and slightly tachycardic, no murmurs / rubs / gallops. S1 and S2  auscultated. No appreciable extremity edema Abdomen: Soft, non-tender, non-distended.  Bowel sounds positive.  GU: Deferred.  Data Reviewed:  I have independently reviewed and interpreted the patient's CBC and lipid panel as well as BMP  Patient's sodium is on the lower side at 132.  Hemoglobin/hematocrit dropped slightly and is now 11.9/34.6 and platelet count is normal 35  Family Communication: No family currently at bedside  Disposition: Status is: Inpatient Remains inpatient appropriate because: Has an AVNRT that needs to be improved and off of amiodarone drip and needs PT and OT to further evaluate and treat  Planned Discharge Destination:  Pending PT and OT to further evaluate and treat  DVT prophylaxis: Enoxaparin 30 mg subcu q. 24  Author: Raiford Noble, DO [facility] 07/12/2021 7:49 PM  For on call review [email or web address].

## 2021-07-12 NOTE — Assessment & Plan Note (Addendum)
On admission with moderate leukocytes, positive nitrite, many bacteria, 6-10 WBCs. Urine culture with Staph aureus, also with mixed; asymptomatic; suspect colonization.  Continue Foley catheter.  Has outpatient follow-up scheduled with urology.  Continue finasteride and Rapaflo.

## 2021-07-12 NOTE — Hospital Course (Addendum)
Raymond Fowler is a 88 year old male with past medical history significant for essential hypertension, type 2 diabetes mellitus, BPH with chronic obstructive uropathy s/p Foley catheter, hard of hearing, chronic dizziness who presented to [facility] ED on 2/12 with palpitations and worsening dizziness.  Onset roughly 6 days prior to ED presentation with associated dizziness, nausea.  Was initially seen in the ED, found to be borderline tachycardic and dehydrated, given IV fluid hydration with normalization of his heart rate was discharged home.  Over the next few days, patient continued to experience frequent episodes of lightheadedness with palpitations and increasing frequency of nausea without vomiting.  Denies chest pain or shortness of breath.  In the ED, sodium 130, potassium 4.0, chloride 96, CO2 24, glucose 148, BUN 20, creatinine 1.12.  AST 35, ALT 23, total bilirubin 0.8.  WBC 7.5, hemoglobin 13.2, platelets 182.  COVID-19 PCR positive.  Influenza A/B PCR negative.  Patient was found to be in SVT, patient was given 6 mg IV Adazin with EKG showing AVNRT.  Chest x-ray negative for infiltrates.  COVID-19 PCR was notably positive.  Patient was started on amiodarone drip.  Cardiology consulted.  TRH consulted for further evaluation and management of SVT.

## 2021-07-12 NOTE — Assessment & Plan Note (Addendum)
Repleted during hospitalization. °

## 2021-07-12 NOTE — Progress Notes (Signed)
Initial Nutrition Assessment  DOCUMENTATION CODES:  Severe malnutrition in context of social or environmental circumstances, Underweight  INTERVENTION:  Liberalize diet to regular for advanced age and low BMI Ensure Enlive po TID, each supplement provides 350 kcal and 20 grams of protein. MVI with minerals daily  NUTRITION DIAGNOSIS:  Severe Malnutrition related to social / environmental circumstances (inadequate energy intake) as evidenced by severe fat depletion, severe muscle depletion.  GOAL:  Patient will meet greater than or equal to 90% of their needs  MONITOR:  PO intake, Supplement acceptance, Weight trends  REASON FOR ASSESSMENT:  Consult Assessment of nutrition requirement/status  ASSESSMENT:  88 year old male with history of DM type 2, HTN, HLD, and hearing loss with cochlear implant presented to ED with severe sore throat and dizziness. Found to be positive for COVID19  Pt resting in bed at the time of assessment. Extremely hard of hearing, family in room assists with interview. Reports that pt's weight is stable and stays about 124 lb (has been for some time. Bed weight of 123.8 lb today.   Family also reports that pt has a good appetite, eats 3x/d and drinks 1 ensure/d. Prefers chocolate. Will add 3x/d and encourged consumption to meet increased needs with COVID19 infection.   Average Meal Intake: 2/12: 100% intake x 1 recorded meals  Nutritionally Relevant Medications: Scheduled Meds:  vitamin C  1,000 mg Oral Daily   atorvastatin  20 mg Oral QPM   feeding supplement  237 mL Oral BID BM   insulin aspart  0-9 Units Subcutaneous TID WC   multivitamin with minerals  1 tablet Oral Daily   Continuous Infusions:  remdesivir 100 mg in NS 100 mL 100 mg (07/12/21 1009)   PRN Meds: meclizine, ondansetron, phenol  Labs Reviewed: Sodium 132 SBG ranges from 105-164 mg/dL over the last 24 hours HgbA1c 6.3% (2/12)  NUTRITION - FOCUSED PHYSICAL EXAM: Flowsheet Row  Most Recent Value  Orbital Region Moderate depletion  Upper Arm Region Severe depletion  Thoracic and Lumbar Region Severe depletion  Buccal Region Moderate depletion  Temple Region Moderate depletion  Clavicle Bone Region Severe depletion  Clavicle and Acromion Bone Region Severe depletion  Scapular Bone Region Severe depletion  Dorsal Hand Moderate depletion  Patellar Region Severe depletion  Anterior Thigh Region Severe depletion  Posterior Calf Region Severe depletion  Edema (RD Assessment) None  Hair Reviewed  Eyes Reviewed  Mouth Reviewed  Skin Reviewed  Nails Reviewed  [thin and brittle]   Diet Order:   Diet Order             Diet regular Room service appropriate? Yes; Fluid consistency: Thin  Diet effective now                   EDUCATION NEEDS:  No education needs have been identified at this time  Skin:  Skin Assessment: Reviewed RN Assessment (skin tear to the right arm)  Last BM:  unsure  Height:  Ht Readings from Last 1 Encounters:  07/11/21 5\' 10"  (1.778 m)    Weight:  Wt Readings from Last 1 Encounters:  07/12/21 56.2 kg    Ideal Body Weight:  75.5 kg  BMI:  Body mass index is 17.76 kg/m.  Estimated Nutritional Needs:  Kcal:  1500-1700 kcal/d Protein:  75-85 g/d Fluid:  1.5-1.8 L/d   Ranell Patrick, RD, LDN Clinical Dietitian RD pager # available in [facility]  After hours/weekend pager # available in [facility]

## 2021-07-12 NOTE — Assessment & Plan Note (Addendum)
Platelets dropped from 182 to 135; now up to 170; stable.  On aspirin as above.

## 2021-07-12 NOTE — Assessment & Plan Note (Addendum)
Urine sodium 74, urine osmolality 264.  Recommend repeat BMP 1 week.

## 2021-07-12 NOTE — Assessment & Plan Note (Addendum)
Supportive care. 

## 2021-07-12 NOTE — Assessment & Plan Note (Addendum)
-  Nutrition Status: Nutrition Problem: Severe Malnutrition Etiology: social / environmental circumstances (inadequate energy intake) Signs/Symptoms: severe fat depletion, severe muscle depletion Interventions: Refer to RD note for recommendations  Dietitian was consulted and followed during hospital course. Continue to encourage increased oral intake, supplementation.

## 2021-07-13 ENCOUNTER — Inpatient Hospital Stay (HOSPITAL_COMMUNITY): Payer: Medicare Other

## 2021-07-13 DIAGNOSIS — E038 Other specified hypothyroidism: Secondary | ICD-10-CM

## 2021-07-13 DIAGNOSIS — I471 Supraventricular tachycardia: Secondary | ICD-10-CM | POA: Diagnosis not present

## 2021-07-13 DIAGNOSIS — E871 Hypo-osmolality and hyponatremia: Secondary | ICD-10-CM

## 2021-07-13 DIAGNOSIS — H918X3 Other specified hearing loss, bilateral: Secondary | ICD-10-CM

## 2021-07-13 HISTORY — DX: Other specified hypothyroidism: E03.8

## 2021-07-13 HISTORY — DX: Other disorders of phosphorus metabolism: E83.39

## 2021-07-13 LAB — COMPREHENSIVE METABOLIC PANEL
ALT: 21 U/L (ref 0–44)
AST: 22 U/L (ref 15–41)
Albumin: 2.9 g/dL — ABNORMAL LOW (ref 3.5–5.0)
Alkaline Phosphatase: 28 U/L — ABNORMAL LOW (ref 38–126)
Anion gap: 7 (ref 5–15)
BUN: 26 mg/dL — ABNORMAL HIGH (ref 8–23)
CO2: 23 mmol/L (ref 22–32)
Calcium: 8.1 mg/dL — ABNORMAL LOW (ref 8.9–10.3)
Chloride: 100 mmol/L (ref 98–111)
Creatinine, Ser: 1.01 mg/dL (ref 0.61–1.24)
GFR, Estimated: >60 mL/min (ref >60)
Glucose, Bld: 122 mg/dL — ABNORMAL HIGH (ref 70–99)
Potassium: 4.3 mmol/L (ref 3.5–5.1)
Sodium: 130 mmol/L — ABNORMAL LOW (ref 135–145)
Total Bilirubin: <0.1 mg/dL — ABNORMAL LOW (ref 0.3–1.2)
Total Protein: 5.6 g/dL — ABNORMAL LOW (ref 6.5–8.1)

## 2021-07-13 LAB — CBC WITH DIFFERENTIAL/PLATELET
Abs Immature Granulocytes: 0.01 10*3/uL (ref 0.00–0.07)
Basophils Absolute: 0 10*3/uL (ref 0.0–0.1)
Basophils Relative: 0 %
Eosinophils Absolute: 0.1 10*3/uL (ref 0.0–0.5)
Eosinophils Relative: 1 %
HCT: 35 % — ABNORMAL LOW (ref 39.0–52.0)
Hemoglobin: 12 g/dL — ABNORMAL LOW (ref 13.0–17.0)
Immature Granulocytes: 0 %
Lymphocytes Relative: 15 %
Lymphs Abs: 1 10*3/uL (ref 0.7–4.0)
MCH: 30.5 pg (ref 26.0–34.0)
MCHC: 34.3 g/dL (ref 30.0–36.0)
MCV: 89.1 fL (ref 80.0–100.0)
Monocytes Absolute: 0.7 10*3/uL (ref 0.1–1.0)
Monocytes Relative: 11 %
Neutro Abs: 5 10*3/uL (ref 1.7–7.7)
Neutrophils Relative %: 73 %
Platelets: 170 10*3/uL (ref 150–400)
RBC: 3.93 MIL/uL — ABNORMAL LOW (ref 4.22–5.81)
RDW: 13.4 % (ref 11.5–15.5)
WBC: 6.8 10*3/uL (ref 4.0–10.5)
nRBC: 0 % (ref 0.0–0.2)

## 2021-07-13 LAB — D-DIMER, QUANTITATIVE: D-Dimer, Quant: 0.93 ug/mL-FEU — ABNORMAL HIGH (ref 0.00–0.50)

## 2021-07-13 LAB — T4, FREE: Free T4: 1.12 ng/dL (ref 0.61–1.12)

## 2021-07-13 LAB — ECHOCARDIOGRAM COMPLETE
AR max vel: 2.78 cm2
AV Peak grad: 7.3 mmHg
Ao pk vel: 1.35 m/s
Area-P 1/2: 2.64 cm2
Height: 70 in
P 1/2 time: 473 msec
S' Lateral: 4.6 cm
Single Plane A4C EF: 32.1 %
Weight: 1980.8 oz

## 2021-07-13 LAB — LACTATE DEHYDROGENASE: LDH: 169 U/L (ref 98–192)

## 2021-07-13 LAB — GLUCOSE, CAPILLARY
Glucose-Capillary: 129 mg/dL — ABNORMAL HIGH (ref 70–99)
Glucose-Capillary: 192 mg/dL — ABNORMAL HIGH (ref 70–99)
Glucose-Capillary: 175 mg/dL — ABNORMAL HIGH (ref 70–99)
Glucose-Capillary: 135 mg/dL — ABNORMAL HIGH (ref 70–99)

## 2021-07-13 LAB — SEDIMENTATION RATE: Sed Rate: 11 mm/hr (ref 0–16)

## 2021-07-13 LAB — FERRITIN: Ferritin: 137 ng/mL (ref 24–336)

## 2021-07-13 LAB — C-REACTIVE PROTEIN: CRP: 2.5 mg/dL — ABNORMAL HIGH (ref <1.0)

## 2021-07-13 LAB — MAGNESIUM: Magnesium: 1.8 mg/dL (ref 1.7–2.4)

## 2021-07-13 LAB — PHOSPHORUS: Phosphorus: 2.4 mg/dL — ABNORMAL LOW (ref 2.5–4.6)

## 2021-07-13 MED ORDER — AMIODARONE HCL 200 MG PO TABS: 400.0000 mg | ORAL_TABLET | Freq: Every day | ORAL | Status: DC

## 2021-07-13 MED ORDER — SODIUM PHOSPHATES 45 MMOLE/15ML IV SOLN
15.0000 mmol | Freq: Once | INTRAVENOUS | Status: AC
  Administered 2021-07-13: 15 mmol via INTRAVENOUS
  Filled 2021-07-13: qty 5

## 2021-07-13 MED ORDER — ACETAMINOPHEN 325 MG PO TABS
650.0000 mg | ORAL_TABLET | Freq: Four times a day (QID) | ORAL | Status: DC | PRN
  Filled 2021-07-13: qty 2

## 2021-07-13 MED ORDER — MAGNESIUM SULFATE 2 GM/50ML IV SOLN
2.0000 g | Freq: Once | INTRAVENOUS | Status: AC
  Administered 2021-07-13: 2 g via INTRAVENOUS
  Filled 2021-07-13: qty 50

## 2021-07-13 MED ORDER — ENOXAPARIN SODIUM 40 MG/0.4ML IJ SOSY
40.0000 mg | PREFILLED_SYRINGE | INTRAMUSCULAR | Status: DC
  Administered 2021-07-14: 40 mg via SUBCUTANEOUS
  Filled 2021-07-13: qty 0.4

## 2021-07-13 MED ORDER — AMIODARONE HCL 200 MG PO TABS
400.0000 mg | ORAL_TABLET | Freq: Two times a day (BID) | ORAL | Status: DC
  Administered 2021-07-13 – 2021-07-15 (×5): 400 mg via ORAL
  Filled 2021-07-13 (×5): qty 2

## 2021-07-13 NOTE — Assessment & Plan Note (Addendum)
TSH elevated at 10.609; free T41.12 And free T3 92 which is within normal limits. Repeat TFTs in 4-6 weeks and start Levothyroxine as an outpatient if still elevated.

## 2021-07-13 NOTE — Progress Notes (Addendum)
Discussed with Dr. Harriet Masson who is off this afternoon.  Stop IV amiodarone.  Start p.o. amiodarone 400 mg twice daily.  Dr. Harriet Masson to see patient tomorrow morning with final recommendations.

## 2021-07-13 NOTE — Progress Notes (Addendum)
Progress Note   Patient: Raymond Fowler NLG:[id number] DOB: [birth year] DOA: 07/11/2021     1 DOS: the patient was seen and examined on 07/13/2021   Brief hospital course: The patient is an 88 year old elderly cachectic and frail-appearing Caucasian male with a past medical history significant for but not limited to hypertension, chronic urethritis obstruction secondary to BPH, indwelling Foley catheter, extremely hard of hearing, chronic dizziness, osteoporosis, protein calorie malnutrition, insulin-dependent diabetes mellitus type 2 as well as other comorbidities who presents with worsening sore throat, palpitations and feeling dizzy.  Symptoms started about 6 days ago when he started feeling dizzy and nauseous and so he presented to the ED at that time and he is found to be borderline tachycardic and found to be dehydrated.  After hydration his heart rate normalized and patient started to feel better and then he was discharged home.  Next few days he continued to experience frequent episodes of lightheadedness and palpitations and frequent feeling of nausea but no vomiting.  Today he started feeling sore throat and had no chest pain or shortness of breath came in because of palpitations.  He was found to be in SVT and was given 6 mg of IV adenosine.  Repeat EKG after that showed AVNRT.  Chest x-ray was negative for infiltrates and he was incidentally tested call for COVID disease.    EP cardiology was consulted and recommended an amiodarone drip.  And he remains on amiodarone drip currently and changed to po Amiodarone 400 mg po BID by Cardiology as he converted to NSR. Cardiology had recommended ordering an ECHO and this is pending to be done.  He tested positive for COVID he was initiated on remdesivir and inflammatory markers were checked and will continue for now as CRP is improving.  Currently given that he has no signs or symptoms of pneumonia we will holding off steroids.  Will need PT/OT to  evaluate and Treat and this is pending.   Assessment and Plan: * SVT (supraventricular tachycardia) ([place])- (present on admission) - Presented to the ED with palpitations and was given a 6 mg of IV adenosine and after the 6 mg of IV adenosine this terminated the rhythm in the range of 100 within AV nodal block and cardiology feels that this is a AVNRT.  Cardiology recommended placing on amiodarone drip and he is on 30 mg IV amiodarone and he converted to NSR; Cardiology recommending changing to 400 mg po BID and they will re-evaluate in the AM  -TSH was done and 10.609 and Free T4 was 1.12 -Echocardiogram has been ordered and pending to be done -Etiology could be related to COVID-19 vaccine however he did test positive for COVID today  -Inflammatory markers were checked -His home dose of beta-blocker increased from 25 mg p.o. daily to 50 mg p.o. daily -Cardiology recommends keeping magnesium above 2 and potassium above 4 -Further care per cardiology -Will ned PT/OT to evaluate and Treat  Subclinical hypothyroidism -Patient's TSH was 10.609 and T4 was 1.12 -Will need to repeat TFTs in 4-6 weeks and start Levothyroxine as an outpatient if still elevated   Hypophosphatemia -Phos Level  2.4 -Replete with IV Sodium Phos 15 mmol -Continue to Monitor and Replete as Necessary -Repeat Phos Level in the AM   Normocytic anemia -Patient's hemoglobin/hematocrit went from 13.2/38.8 ->11.9/34.6 -> 12.0/35.0 -Check anemia panel in the a.m. -Continue to monitor for signs or symptoms of bleeding; no overt bleeding noted -Repeat CBC in a.m.  Thrombocytopenia ([place]) -Patient's Platelet  Count went from 182 -> 135 and is now improved and resolved at 170 -Continue to Monitor for S/Sx of Bleeding; No overt bleeding noted -Repeat CBC in the AM   Hyponatremia - Mild and improving.  Patient's sodium went from 130s now 132 -> 130 -Replete with IV Sodium Phos 15 mmol -Continue monitor and trend and repeat CMP  in a.m.  COVID-19 virus infection -Incidentally tested positive for COVID-19 disease -Initiated on Remdesivir -Check inflammatory markers -SpO2: 97 % - Recent Labs    07/11/21 1757 07/11/21 2031 07/12/21 0240 07/13/21 0602  DDIMER  --  1.13* 0.85* 0.93*  FERRITIN 127  --  147 137  LDH 166  --   --  169  CRP  --   --  3.3* 2.5*    Lab Results  Component Value Date   SARSCOV2NAA POSITIVE (A) 07/11/2021   [place] NEGATIVE 07/05/2021  -Airborne and contact precautions -Continue with ipratropium inhaler 2 puffs IH every 6 as needed shortness of breath as well as asorbic acid 1000 mg daily -X-ray showed no evidence of pneumonia -Continue to monitor respiratory status carefully -Will need a PT/OT Evaluation   BPH (benign prostatic hyperplasia) -Continue with finasteride 5 mg p.o. daily as well as tamsulosin 0.4 mg p.o. daily  Hypomagnesemia -Mag Level Level was 1.6 and improved to 2.0 yesterday and today is 1.8 -Replete with IV Mag Sulfate 2 grams again -Continue to Monitor and Replete as Necessary   Protein-calorie malnutrition, severe -Nutrition Status: Nutrition Problem: Severe Malnutrition Etiology: social / environmental circumstances (inadequate energy intake) Signs/Symptoms: severe fat depletion, severe muscle depletion Interventions: Refer to RD note for recommendations    Diet-controlled diabetes mellitus ([place]) -CBGs ranged from 110-229 -Continue with sensitive NovoLog/scale insulin AC  Bilateral hearing loss- (present on admission) -Extremely HOH   Nutrition Documentation    [facility] to H[facility]-Ad[facility] (Current) from 07/11/2021 in [facility]  Nutrition Problem Severe Malnutrition  Etiology social / environmental circumstances  [inadequate energy intake]  Nutrition Goal Patient will meet greater than or equal to 90% of their needs  Interventions Refer to RD note for recommendations      Subjective: Seen and examined at  bedside and he was doing okay.  Denied chest pain or shortness of breath.  Felt okay.  Wanting to go home.  No other concerns or complaints this time.  Physical Exam: Vitals:   07/13/21 0011 07/13/21 0522 07/13/21 0817 07/13/21 1139  BP: 125/66 132/75 135/86 139/69  Pulse: 64 (!) 58 60 60  Resp:  20 20 20   Temp: 98.2 F (36.8 C) 98.6 F (37 C) 98.3 F (36.8 C) 98.2 F (36.8 C)  TempSrc: Oral Oral Oral Oral  SpO2: 95% 96% 96% 97%  Weight:      Height:       Examination: Physical Exam:  Constitutional: Thin elderly Caucasian male currently no acute distress ENMT: External Ears, Nose appear normal.  He is extremely hard of hearing Respiratory: Diminished to auscultation bilaterally, no wheezing, rales, rhonchi or crackles. Normal respiratory effort and patient is not tachypenic. No accessory muscle use.  Cardiovascular: RRR and no longer is rhythm, no murmurs / rubs / gallops. S1 and S2 auscultated.  Abdomen: Soft, non-tender, non-distended. Bowel sounds positive.  GU: Deferred.  Data Reviewed:  I have independently reviewed and interpreted the patient's CBC and CMP  Patient's sodium is now 130 and his phosphorus level now 0.4.  CRP is trending down and hemoglobin/hematocrit is relatively stable.  Family Communication: No family currently at bedside  Disposition: Status is: Inpatient Remains inpatient appropriate because: Needs PT/OT to evaluate and treat   Planned Discharge Destination:  Pending PT/OT Evaluation and Clearance by Cardiology  DVT prophylaxis: Continue enoxaparin 30 mg subcu every 24  Author: Raiford Noble, DO [facility]  07/13/2021 3:11 PM  For on call review [email or web address].

## 2021-07-13 NOTE — Assessment & Plan Note (Addendum)
Repleted during hospitalization, repeat phosphorus level 2.8, within normal limits.

## 2021-07-14 ENCOUNTER — Inpatient Hospital Stay (HOSPITAL_COMMUNITY): Payer: Medicare Other

## 2021-07-14 ENCOUNTER — Other Ambulatory Visit (HOSPITAL_COMMUNITY): Payer: Self-pay

## 2021-07-14 DIAGNOSIS — I471 Supraventricular tachycardia: Secondary | ICD-10-CM | POA: Diagnosis not present

## 2021-07-14 DIAGNOSIS — I420 Dilated cardiomyopathy: Secondary | ICD-10-CM | POA: Diagnosis present

## 2021-07-14 HISTORY — DX: Dilated cardiomyopathy: I42.0

## 2021-07-14 LAB — IRON AND TIBC
Iron: 30 ug/dL — ABNORMAL LOW (ref 45–182)
Saturation Ratios: 12 % — ABNORMAL LOW (ref 17.9–39.5)
TIBC: 252 ug/dL (ref 250–450)
UIBC: 222 ug/dL

## 2021-07-14 LAB — COMPREHENSIVE METABOLIC PANEL
ALT: 20 U/L (ref 0–44)
AST: 19 U/L (ref 15–41)
Albumin: 2.9 g/dL — ABNORMAL LOW (ref 3.5–5.0)
Alkaline Phosphatase: 33 U/L — ABNORMAL LOW (ref 38–126)
Anion gap: 9 (ref 5–15)
BUN: 23 mg/dL (ref 8–23)
CO2: 22 mmol/L (ref 22–32)
Calcium: 8 mg/dL — ABNORMAL LOW (ref 8.9–10.3)
Chloride: 98 mmol/L (ref 98–111)
Creatinine, Ser: 0.94 mg/dL (ref 0.61–1.24)
GFR, Estimated: >60 mL/min (ref >60)
Glucose, Bld: 147 mg/dL — ABNORMAL HIGH (ref 70–99)
Potassium: 4.4 mmol/L (ref 3.5–5.1)
Sodium: 129 mmol/L — ABNORMAL LOW (ref 135–145)
Total Bilirubin: 0.3 mg/dL (ref 0.3–1.2)
Total Protein: 5.7 g/dL — ABNORMAL LOW (ref 6.5–8.1)

## 2021-07-14 LAB — CBC WITH DIFFERENTIAL/PLATELET
Abs Immature Granulocytes: 0.02 10*3/uL (ref 0.00–0.07)
Basophils Absolute: 0 10*3/uL (ref 0.0–0.1)
Basophils Relative: 0 %
Eosinophils Absolute: 0.1 10*3/uL (ref 0.0–0.5)
Eosinophils Relative: 1 %
HCT: 33.4 % — ABNORMAL LOW (ref 39.0–52.0)
Hemoglobin: 11.9 g/dL — ABNORMAL LOW (ref 13.0–17.0)
Immature Granulocytes: 0 %
Lymphocytes Relative: 13 %
Lymphs Abs: 1 10*3/uL (ref 0.7–4.0)
MCH: 30.9 pg (ref 26.0–34.0)
MCHC: 35.6 g/dL (ref 30.0–36.0)
MCV: 86.8 fL (ref 80.0–100.0)
Monocytes Absolute: 0.9 10*3/uL (ref 0.1–1.0)
Monocytes Relative: 13 %
Neutro Abs: 5.5 10*3/uL (ref 1.7–7.7)
Neutrophils Relative %: 73 %
Platelets: 170 10*3/uL (ref 150–400)
RBC: 3.85 MIL/uL — ABNORMAL LOW (ref 4.22–5.81)
RDW: 13.2 % (ref 11.5–15.5)
WBC: 7.5 10*3/uL (ref 4.0–10.5)
nRBC: 0 % (ref 0.0–0.2)

## 2021-07-14 LAB — PHOSPHORUS: Phosphorus: 2.8 mg/dL (ref 2.5–4.6)

## 2021-07-14 LAB — RETICULOCYTES
Immature Retic Fract: 5.9 % (ref 2.3–15.9)
RBC.: 3.91 MIL/uL — ABNORMAL LOW (ref 4.22–5.81)
Retic Count, Absolute: 37.4 10*3/uL (ref 19.0–186.0)
Retic Ct Pct: 1 % (ref 0.4–3.1)

## 2021-07-14 LAB — GLUCOSE, CAPILLARY
Glucose-Capillary: 133 mg/dL — ABNORMAL HIGH (ref 70–99)
Glucose-Capillary: 166 mg/dL — ABNORMAL HIGH (ref 70–99)
Glucose-Capillary: 119 mg/dL — ABNORMAL HIGH (ref 70–99)
Glucose-Capillary: 145 mg/dL — ABNORMAL HIGH (ref 70–99)

## 2021-07-14 LAB — VITAMIN B12: Vitamin B-12: 1141 pg/mL — ABNORMAL HIGH (ref 180–914)

## 2021-07-14 LAB — D-DIMER, QUANTITATIVE: D-Dimer, Quant: 0.78 ug/mL-FEU — ABNORMAL HIGH (ref 0.00–0.50)

## 2021-07-14 LAB — T3: T3, Total: 92 ng/dL (ref 71–180)

## 2021-07-14 LAB — MAGNESIUM: Magnesium: 2 mg/dL (ref 1.7–2.4)

## 2021-07-14 LAB — FERRITIN: Ferritin: 133 ng/mL (ref 24–336)

## 2021-07-14 LAB — SEDIMENTATION RATE: Sed Rate: 14 mm/hr (ref 0–16)

## 2021-07-14 LAB — C-REACTIVE PROTEIN: CRP: 2.4 mg/dL — ABNORMAL HIGH (ref <1.0)

## 2021-07-14 LAB — LACTATE DEHYDROGENASE: LDH: 170 U/L (ref 98–192)

## 2021-07-14 LAB — FOLATE: Folate: 25.5 ng/mL (ref >5.9)

## 2021-07-14 NOTE — TOC Benefit Eligibility Note (Signed)
Patient Research scientist (life sciences) completed.     The patient is currently admitted and upon discharge could be taking ENTRESTO 24-26MG .   The current 30 day co-pay is, $47.   The patient is insured through [employer].     The patient is currently admitted and upon discharge could be taking FARXIGA 10MG .   The current 30 day co-pay is, $47.

## 2021-07-14 NOTE — Progress Notes (Signed)
PROGRESS NOTE    Raymond Fowler  EHU:[id number] DOB: 01-02-1933 DOA: 07/11/2021 PCP: Orpah Melter, MD    Brief Narrative:  Raymond Fowler is a 88 year old male with past medical history significant for essential hypertension, type 2 diabetes mellitus, BPH with chronic obstructive uropathy s/p Foley catheter, hard of hearing, chronic dizziness who presented to [facility] ED on 2/12 with palpitations and worsening dizziness.  Onset roughly 6 days prior to ED presentation with associated dizziness, nausea.  Was initially seen in the ED, found to be borderline tachycardic and dehydrated, given IV fluid hydration with normalization of his heart rate was discharged home.  Over the next few days, patient continued to experience frequent episodes of lightheadedness with palpitations and increasing frequency of nausea without vomiting.  Denies chest pain or shortness of breath.  In the ED, sodium 130, potassium 4.0, chloride 96, CO2 24, glucose 148, BUN 20, creatinine 1.12.  AST 35, ALT 23, total bilirubin 0.8.  WBC 7.5, hemoglobin 13.2, platelets 182.  COVID-19 PCR positive.  Influenza A/B PCR negative.  Patient was found to be in SVT, patient was given 6 mg IV Adazin with EKG showing AVNRT.  Chest x-ray negative for infiltrates.  COVID-19 PCR was notably positive.  Patient was started on amiodarone drip.  Cardiology consulted.  TRH consulted for further evaluation and management of SVT.    Assessment & Plan:   Assessment and Plan: * SVT (supraventricular tachycardia) ([place])- (present on admission) Patient presenting to ED with recurrent palpitations and was found to be in SVT.  Patient was given 6 mg IV adenosine x2 with termination of rhythm.  Cardiology was consulted and patient was initially started on amiodarone drip.  TTE with LVEF 30-35%, LV moderately decreased function, LV with regional wall motion abnormalities, grade 1 diastolic dysfunction, LA mildly dilated, mild MR, no aortic stenosis, IVC  dilated. --Cardiology following, appreciate assistance --Amiodarone 400mg  PO BID --Metoprolol succinate 50 mg p.o. daily --Continue to monitor on telemetry  Dilated cardiomyopathy ([place])- (present on admission) TTE 2/14 with LVEF reduced at 30-35%, LV moderately decreased function with LV regional wall motion normalities, grade 1 diastolic dysfunction, LA mildly dilated, mild MR, no aortic stenosis, IVC dilated.  New diagnosis.  Unclear if tachycardia mediated.  LDL 48. --Cardiology following, appreciate assistance --Metoprolol succinate 50 g p.o. daily --Will need ischemic evaluation, cardiology likely planning outpatient --Cardiology plans to attempt to add Lisabeth Register, Aldactone; but current BP will not tolerate --Continue aspirin and statin --Strict I's and O's and daily weights  COVID-19 virus infection COVID-19 PCR positive on admission, incidental finding.  Chest x-ray with no acute cardiopulmonary disease process and oxygenating well on room air. --Remdesivir day #4/5 --Combivent 2 puffs every 6 hours as needed shortness of breath/wheezing --Vitamin C, zinc --Continue airborne/contact isolation precautions x 10 days from diagnosis  Hypomagnesemia Repleted during hospitalization. --Repeat electrolytes in a.m.   Hyponatremia Urine sodium 74, urine osmolality 264. --BMP in a.m.  Thrombocytopenia (HCC) Platelets dropped from 182 to 135; now up to 170; stable.  On aspirin as above.  Diet-controlled diabetes mellitus ([place]) Diet controlled at home.  Hemoglobin A1c 6.3 on 07/11/2021, well controlled. --sensitive SSI for coverage --CBGs qAC/HS  BPH (benign prostatic hyperplasia)- (present on admission) On admission with moderate leukocytes, positive nitrite, many bacteria, 6-10 WBCs. --Urine culture with Staph aureus, also with mixed; asymptomatic; pending susceptibility --Continue Foley catheter --finasteride 5 mg p.o. daily --tamsulosin 0.4 mg p.o. daily  Normocytic  anemia Anemia panel with iron 30, TIBC 252, ferritin  133, folate 25.5, vitamin B12 1141; etiology likely secondary to anemia of chronic medical disease.  Hemoglobin stable, 11.9.  Hypophosphatemia Repleted during hospitalization, repeat phosphorus level 2.8, within normal limits this morning. -- Repeat electrolytes in a.m.  Subclinical hypothyroidism- (present on admission) TSH elevated at 10.609; free T41.12 And free T3 92 which is within normal limits.   --Repeat TFTs in 4-6 weeks and start Levothyroxine as an outpatient if still elevated   Bilateral hearing loss- (present on admission) --Supportive care  Protein-calorie malnutrition, severe- (present on admission) -Nutrition Status: Nutrition Problem: Severe Malnutrition Etiology: social / environmental circumstances (inadequate energy intake) Signs/Symptoms: severe fat depletion, severe muscle depletion Interventions: Refer to RD note for recommendations  -- Dietitian following, continue to encourage increased oral intake, supplementation.      DVT prophylaxis: enoxaparin (LOVENOX) injection 40 mg Start: 07/14/21 1800    Code Status: Full Code Family Communication: No family present at bedside this morning.  Disposition Plan:  Level of care: Progressive Status is: Inpatient Remains inpatient appropriate because: Continues on IV remdesivir, will complete tomorrow.  Awaiting cardiology to sign off.  Hopeful for discharge home with home health tomorrow.     Consultants:  Cardiology  Procedures:  TTE  Antimicrobials:  Remdesivir   Subjective: Patient seen and examined at bedside, resting comfortably.  No specific complaints this morning.  Very hard of hearing, assists with communication by written notes.  No family present at bedside this morning.  Hopeful for likely discharge home tomorrow.  Denies headache, no chest pain, no shortness of breath, no abdominal pain.  No acute events overnight per nursing  staff.  Objective: Vitals:   07/14/21 0750 07/14/21 1050 07/14/21 1222 07/14/21 1626  BP: 106/65  136/74 121/82  Pulse: 62 67 68 60  Resp: 16  16 18   Temp: 99 F (37.2 C)  98.8 F (37.1 C)   TempSrc: Oral  Oral   SpO2: 96%  97% 99%  Weight:      Height:        Intake/Output Summary (Last 24 hours) at 07/14/2021 1633 Last data filed at 07/14/2021 1400 Gross per 24 hour  Intake 1401.29 ml  Output 2240 ml  Net -838.71 ml   Filed Weights   07/11/21 0930 07/11/21 1613 07/12/21 1600  Weight: 54.4 kg 52 kg 56.2 kg    Examination:  Physical Exam: GEN: NAD, alert and oriented x 3, thin/cachectic in appearance, very hard of hearing HEENT: NCAT, PERRL, EOMI, sclera clear, MMM PULM: CTAB w/o wheezes/crackles, normal respiratory effort, on room air CV: RRR w/o M/G/R GI: abd soft, NTND, NABS, no R/G/M MSK: no peripheral edema, muscle strength globally intact 5/5 bilateral upper/lower extremities NEURO: CN II-XII intact, no focal deficits, sensation to light touch intact PSYCH: normal mood/affect Integumentary: dry/intact, no rashes or wounds    Data Reviewed: I have personally reviewed following labs and imaging studies  CBC: Recent Labs  Lab 07/11/21 0936 07/12/21 0240 07/13/21 0602 07/14/21 0333  WBC 7.5 6.1 6.8 7.5  NEUTROABS 5.5 4.2 5.0 5.5  HGB 13.2 11.9* 12.0* 11.9*  HCT 38.8* 34.6* 35.0* 33.4*  MCV 90.2 89.2 89.1 86.8  PLT 182 135* 170 449   Basic Metabolic Panel: Recent Labs  Lab 07/11/21 0936 07/12/21 0240 07/13/21 0602 07/14/21 0333  NA 130* 132* 130* 129*  K 4.0 3.9 4.3 4.4  CL 96* 101 100 98  CO2 24 22 23 22   GLUCOSE 148* 121* 122* 147*  BUN 20 22 26* 23  CREATININE  1.12 1.12 1.01 0.94  CALCIUM 8.3* 8.0* 8.1* 8.0*  MG 1.6* 2.0 1.8 2.0  PHOS  --  3.0 2.4* 2.8   GFR: Estimated Creatinine Clearance: 44 mL/min (by C-G formula based on SCr of 0.94 mg/dL). Liver Function Tests: Recent Labs  Lab 07/11/21 0936 07/13/21 0602 07/14/21 0333  AST  35 22 19  ALT 23 21 20   ALKPHOS 33* 28* 33*  BILITOT 0.8 <0.1* 0.3  PROT 7.0 5.6* 5.7*  ALBUMIN 3.8 2.9* 2.9*   No results for input(s): LIPASE, AMYLASE in the last 168 hours. No results for input(s): AMMONIA in the last 168 hours. Coagulation Profile: No results for input(s): INR, PROTIME in the last 168 hours. Cardiac Enzymes: No results for input(s): CKTOTAL, CKMB, CKMBINDEX, TROPONINI in the last 168 hours. BNP (last 3 results) No results for input(s): PROBNP in the last 8760 hours. HbA1C: Recent Labs    07/11/21 2031  HGBA1C 6.3*   CBG: Recent Labs  Lab 07/13/21 1624 07/13/21 2139 07/14/21 0745 07/14/21 1221 07/14/21 1612  GLUCAP 175* 135* 133* 166* 119*   Lipid Profile: Recent Labs    07/12/21 0240  CHOL 109  HDL 53  LDLCALC 48  TRIG 39  CHOLHDL 2.1   Thyroid Function Tests: Recent Labs    07/12/21 0240 07/13/21 0656  TSH 10.609*  --   FREET4  --  1.12   Anemia Panel: Recent Labs    07/13/21 0602 07/14/21 0333  VITAMINB12  --  1,141*  FOLATE  --  25.5  FERRITIN 137 133  TIBC  --  252  IRON  --  30*  RETICCTPCT  --  1.0   Sepsis Labs: Recent Labs  Lab 07/11/21 1030 07/11/21 2031  PROCALCITON  --  <0.10  LATICACIDVEN 1.5  --     Recent Results (from the past 240 hour(s))  Culture, blood (Routine X 2) w Reflex to ID Panel     Status: None   Collection Time: 07/05/21 12:45 PM   Specimen: BLOOD  Result Value Ref Range Status   Specimen Description   Final    BLOOD Blood Culture results may not be optimal due to an inadequate volume of blood received in culture bottles Performed at [facility], [address] [place], [place] [address]    Special Requests   Final    AEROBIC BOTTLE ONLY BLOOD RIGHT FOREARM Performed at [facility], [address] [place], [place] [address]    Culture   Final    NO GROWTH 5 DAYS Performed at [facility], [place] [address], [place], [place] [address]    Report Status  07/10/2021 FINAL  Final  Urine Culture     Status: Abnormal   Collection Time: 07/05/21 12:49 PM   Specimen: Urine, Clean Catch  Result Value Ref Range Status   Specimen Description   Final    URINE, CLEAN CATCH Performed at [facility], [address] [place], [place] [address]    Special Requests   Final    NONE Performed at [facility], [address] [place], [place] [address]    Culture >=100,000 COLONIES/mL STAPHYLOCOCCUS AUREUS (A)  Final   Report Status 07/07/2021 FINAL  Final   Organism ID, Bacteria STAPHYLOCOCCUS AUREUS (A)  Final      Susceptibility   Staphylococcus aureus - MIC*    CIPROFLOXACIN <=0.5 SENSITIVE Sensitive     GENTAMICIN <=0.5 SENSITIVE Sensitive  NITROFURANTOIN <=16 SENSITIVE Sensitive     OXACILLIN 0.5 SENSITIVE Sensitive     TETRACYCLINE <=1 SENSITIVE Sensitive     VANCOMYCIN <=0.5 SENSITIVE Sensitive     TRIMETH/SULFA <=10 SENSITIVE Sensitive     CLINDAMYCIN <=0.25 SENSITIVE Sensitive     RIFAMPIN <=0.5 SENSITIVE Sensitive     Inducible Clindamycin NEGATIVE Sensitive     * >=100,000 COLONIES/mL STAPHYLOCOCCUS AUREUS  Culture, blood (Routine X 2) w Reflex to ID Panel     Status: None   Collection Time: 07/05/21 12:56 PM   Specimen: Left Antecubital; Blood  Result Value Ref Range Status   Specimen Description   Final    LEFT ANTECUBITAL Performed at [facility], [address] [place], [place] [address]    Special Requests   Final    BOTTLES DRAWN AEROBIC AND ANAEROBIC Blood Culture adequate volume Performed at [facility], [address] [place], [place] [address]    Culture   Final    NO GROWTH 5 DAYS Performed at [facility], [place] [address] [place], [place] [address]    Report Status 07/10/2021 FINAL  Final  Resp Panel by RT-PCR (Flu A&B, Covid) Nasopharyngeal Swab     Status: None   Collection Time: 07/05/21  1:03 PM   Specimen: Nasopharyngeal Swab; Nasopharyngeal(NP) swabs  in vial transport medium  Result Value Ref Range Status   SARS Coronavirus 2 by RT PCR NEGATIVE NEGATIVE Final    Comment: (NOTE) SARS-CoV-2 target nucleic acids are NOT DETECTED.  The SARS-CoV-2 RNA is generally detectable in upper respiratory specimens during the acute phase of infection. The lowest concentration of SARS-CoV-2 viral copies this assay can detect is 138 copies/mL. A negative result does not preclude SARS-Cov-2 infection and should not be used as the sole basis for treatment or other patient management decisions. A negative result may occur with  improper specimen collection/handling, submission of specimen other than nasopharyngeal swab, presence of viral mutation(s) within the areas targeted by this assay, and inadequate number of viral copies(<138 copies/mL). A negative result must be combined with clinical observations, patient history, and epidemiological information. The expected result is Negative.  Fact Sheet for Patients:  EntrepreneurPulse.com.au  Fact Sheet for Healthcare Providers:  IncredibleEmployment.be  This test is no t yet approved or cleared by the [place] FDA and  has been authorized for detection and/or diagnosis of SARS-CoV-2 by FDA under an Emergency Use Authorization (EUA). This EUA will remain  in effect (meaning this test can be used) for the duration of the COVID-19 declaration under Section 564(b)(1) of the Act, 21 U.S.C.section 360bbb-3(b)(1), unless the authorization is terminated  or revoked sooner.       Influenza A by PCR NEGATIVE NEGATIVE Final   Influenza B by PCR NEGATIVE NEGATIVE Final    Comment: (NOTE) The Xpert Xpress SARS-CoV-2/FLU/RSV plus assay is intended as an aid in the diagnosis of influenza from Nasopharyngeal swab specimens and should not be used as a sole basis for treatment. Nasal washings and aspirates are unacceptable for Xpert Xpress  SARS-CoV-2/FLU/RSV testing.  Fact Sheet for Patients: EntrepreneurPulse.com.au  Fact Sheet for Healthcare Providers: IncredibleEmployment.be  This test is not yet approved or cleared by the [place] FDA and has been authorized for detection and/or diagnosis of SARS-CoV-2 by FDA under an Emergency Use Authorization (EUA). This EUA will remain in effect (meaning this test can be used) for the duration of the COVID-19 declaration under Section 564(b)(1) of  the Act, 21 U.S.C. section 360bbb-3(b)(1), unless the authorization is terminated or revoked.  Performed at [facility], [address] [place], [place] [address]   Resp Panel by RT-PCR (Flu A&B, Covid) Nasopharyngeal Swab     Status: Abnormal   Collection Time: 07/11/21 10:20 AM   Specimen: Nasopharyngeal Swab; Nasopharyngeal(NP) swabs in vial transport medium  Result Value Ref Range Status   SARS Coronavirus 2 by RT PCR POSITIVE (A) NEGATIVE Final    Comment: (NOTE) SARS-CoV-2 target nucleic acids are DETECTED.  The SARS-CoV-2 RNA is generally detectable in upper respiratory specimens during the acute phase of infection. Positive results are indicative of the presence of the identified virus, but do not rule out bacterial infection or co-infection with other pathogens not detected by the test. Clinical correlation with patient history and other diagnostic information is necessary to determine patient infection status. The expected result is Negative.  Fact Sheet for Patients: EntrepreneurPulse.com.au  Fact Sheet for Healthcare Providers: IncredibleEmployment.be  This test is not yet approved or cleared by the [place] FDA and  has been authorized for detection and/or diagnosis of SARS-CoV-2 by FDA under an Emergency Use Authorization (EUA).  This EUA will remain in effect (meaning this test can be used) for the duration of  the  COVID-19 declaration under Section 564(b)(1) of the A ct, 21 U.S.C. section 360bbb-3(b)(1), unless the authorization is terminated or revoked sooner.     Influenza A by PCR NEGATIVE NEGATIVE Final   Influenza B by PCR NEGATIVE NEGATIVE Final    Comment: (NOTE) The Xpert Xpress SARS-CoV-2/FLU/RSV plus assay is intended as an aid in the diagnosis of influenza from Nasopharyngeal swab specimens and should not be used as a sole basis for treatment. Nasal washings and aspirates are unacceptable for Xpert Xpress SARS-CoV-2/FLU/RSV testing.  Fact Sheet for Patients: EntrepreneurPulse.com.au  Fact Sheet for Healthcare Providers: IncredibleEmployment.be  This test is not yet approved or cleared by the [place] FDA and has been authorized for detection and/or diagnosis of SARS-CoV-2 by FDA under an Emergency Use Authorization (EUA). This EUA will remain in effect (meaning this test can be used) for the duration of the COVID-19 declaration under Section 564(b)(1) of the Act, 21 U.S.C. section 360bbb-3(b)(1), unless the authorization is terminated or revoked.  Performed at [facility], [address] [place], [place] [address]   Group A Strep by PCR     Status: None   Collection Time: 07/11/21 10:20 AM   Specimen: Nasopharyngeal Swab; Sterile Swab  Result Value Ref Range Status   Group A Strep by PCR NOT DETECTED NOT DETECTED Final    Comment: Performed at [facility] [address] [place], [place] [address]  Blood culture (routine x 2)     Status: None (Preliminary result)   Collection Time: 07/11/21 10:20 AM   Specimen: BLOOD LEFT FOREARM  Result Value Ref Range Status   Specimen Description   Final    BLOOD LEFT FOREARM BLOOD Performed at [facility], [address] [place], [place] [address]    Special Requests   Final    Blood Culture adequate volume BOTTLES DRAWN AEROBIC AND ANAEROBIC Performed  at [facility], [address] [place], [place] [address]    Culture   Final    NO GROWTH 3 DAYS Performed at [facility], [place] [address] [place], [place] [address]    Report Status PENDING  Incomplete  Blood culture (routine x 2)     Status: None (Preliminary result)   Collection Time: 07/11/21 10:30 AM   Specimen: BLOOD RIGHT HAND  Result Value Ref Range Status   Specimen Description   Final    BLOOD RIGHT HAND BLOOD Performed at [facility], [address] [place], [place] [address]    Special Requests   Final    Blood Culture adequate volume BOTTLES DRAWN AEROBIC AND ANAEROBIC Performed at [facility], [address] [place], [place] [address]    Culture   Final    NO GROWTH 3 DAYS Performed at [facility], [place] [address] [place], [place] [address]    Report Status PENDING  Incomplete  Urine Culture     Status: Abnormal (Preliminary result)   Collection Time: 07/11/21  3:19 PM   Specimen: Urine, Random  Result Value Ref Range Status   Specimen Description   Final    URINE, RANDOM Performed at [facility], [address] [place], [place] [address]    Special Requests   Final    NONE Performed at [facility], [address] [place], [place] [address]    Culture (A)  Final    >=100,000 COLONIES/mL STAPHYLOCOCCUS AUREUS within mixed SUSCEPTIBILITIES TO FOLLOW Performed at [facility], [place] [address], [place], [place] [address]    Report Status PENDING  Incomplete         Radiology Studies: DG CHEST PORT 1 VIEW  Result Date: 07/14/2021 CLINICAL DATA:  Shortness of breath. EXAM: PORTABLE CHEST 1 VIEW COMPARISON:  07/13/2021 and CT chest 02/19/2021. FINDINGS: Trachea is midline. Heart is at the upper limits of normal in size and accentuated by AP semi upright technique. Mild interstitial prominence. No airspace consolidation or pleural fluid. IMPRESSION: Question developing  pulmonary edema. Electronically Signed   By: Lorin Picket M.D.   On: 07/14/2021 08:22   DG CHEST PORT 1 VIEW  Result Date: 07/13/2021 CLINICAL DATA:  Sore throat, nasal congestion, COVID positive EXAM: PORTABLE CHEST 1 VIEW COMPARISON:  07/12/2011 FINDINGS: Mild cardiomegaly. Both lungs are clear. The visualized skeletal structures are unremarkable. IMPRESSION: Mild cardiomegaly. No acute abnormality of the lungs in AP portable projection. Electronically Signed   By: Delanna Ahmadi M.D.   On: 07/13/2021 08:06   ECHOCARDIOGRAM COMPLETE  Result Date: 07/13/2021    ECHOCARDIOGRAM REPORT   Patient Name:   Raymond Fowler Date of Exam: 07/13/2021 Medical Rec #:  [id number]   Height:       70.0 in Accession #:    [id number]  Weight:       123.8 lb Date of Birth:  Oct 12, 1932   BSA:          1.702 m Patient Age:    89 years    BP:           139/69 mmHg Patient Gender: M           HR:           64 bpm. Exam Location:  Inpatient Procedure: 2D Echo Indications:    Ventricular tachycardia  History:        Patient has no prior history of Echocardiogram examinations.                 Arrythmias:Tachycardia.  Sonographer:    Jefferey Pica Referring Phys: [id number] OMAIR LATIF Oden  1. Left ventricular ejection fraction,  by estimation, is 30 to 35%. The left ventricle has moderately decreased function. The left ventricle demonstrates regional wall motion abnormalities (see scoring diagram/findings for description). Left ventricular  diastolic parameters are consistent with Grade I diastolic dysfunction (impaired relaxation).  2. Right ventricular systolic function is normal. The right ventricular size is normal. There is normal pulmonary artery systolic pressure. The estimated right ventricular systolic pressure is 25.9 mmHg.  3. Left atrial size was mildly dilated.  4. The mitral valve is degenerative. Mild mitral valve regurgitation. No evidence of mitral stenosis.  5. The aortic valve is tricuspid. Aortic valve  regurgitation is mild. Aortic valve sclerosis is present, with no evidence of aortic valve stenosis.  6. The inferior vena cava is dilated in size with <50% respiratory variability, suggesting right atrial pressure of 15 mmHg. FINDINGS  Left Ventricle: Left ventricular ejection fraction, by estimation, is 30 to 35%. The left ventricle has moderately decreased function. The left ventricle demonstrates regional wall motion abnormalities. The left ventricular internal cavity size was normal in size. There is no left ventricular hypertrophy. Left ventricular diastolic parameters are consistent with Grade I diastolic dysfunction (impaired relaxation).  LV Wall Scoring: The apical septal segment, apical anterior segment, apical inferior segment, and apex are akinetic. Right Ventricle: The right ventricular size is normal. No increase in right ventricular wall thickness. Right ventricular systolic function is normal. There is normal pulmonary artery systolic pressure. The tricuspid regurgitant velocity is 1.98 m/s, and  with an assumed right atrial pressure of 15 mmHg, the estimated right ventricular systolic pressure is 56.3 mmHg. Left Atrium: Left atrial size was mildly dilated. Right Atrium: Right atrial size was normal in size. Pericardium: Trivial pericardial effusion is present. Mitral Valve: The mitral valve is degenerative in appearance. Mild mitral valve regurgitation. No evidence of mitral valve stenosis. Tricuspid Valve: The tricuspid valve is grossly normal. Tricuspid valve regurgitation is trivial. No evidence of tricuspid stenosis. Aortic Valve: The aortic valve is tricuspid. Aortic valve regurgitation is mild. Aortic regurgitation PHT measures 473 msec. Aortic valve sclerosis is present, with no evidence of aortic valve stenosis. Aortic valve peak gradient measures 7.3 mmHg. Pulmonic Valve: The pulmonic valve was grossly normal. Pulmonic valve regurgitation is not visualized. No evidence of pulmonic  stenosis. Aorta: The aortic root is normal in size and structure. Venous: The inferior vena cava is dilated in size with less than 50% respiratory variability, suggesting right atrial pressure of 15 mmHg. IAS/Shunts: The atrial septum is grossly normal.  LEFT VENTRICLE PLAX 2D LVIDd:         5.40 cm      Diastology LVIDs:         4.60 cm      LV e' medial:   3.88 cm/s LV PW:         1.10 cm      LV E/e' medial: 16.9 LV IVS:        1.10 cm LVOT diam:     2.20 cm LV SV:         85 LV SV Index:   50 LVOT Area:     3.80 cm  LV Volumes (MOD) LV vol d, MOD A4C: 146.0 ml LV vol s, MOD A4C: 99.1 ml LV SV MOD A4C:     146.0 ml RIGHT VENTRICLE            IVC RV S prime:     9.57 cm/s  IVC diam: 2.20 cm TAPSE (M-mode): 2.2 cm LEFT ATRIUM  Index        RIGHT ATRIUM           Index LA diam:      3.40 cm 2.00 cm/m   RA Area:     16.20 cm LA Vol (A2C): 46.4 ml 27.26 ml/m  RA Volume:   39.40 ml  23.14 ml/m LA Vol (A4C): 68.6 ml 40.30 ml/m  AORTIC VALVE                 PULMONIC VALVE AV Area (Vmax): 2.78 cm     PV Vmax:       0.68 m/s AV Vmax:        135.00 cm/s  PV Peak grad:  1.9 mmHg AV Peak Grad:   7.3 mmHg LVOT Vmax:      98.60 cm/s LVOT Vmean:     62.900 cm/s LVOT VTI:       0.223 m AI PHT:         473 msec  AORTA Ao Root diam: 3.40 cm MITRAL VALVE                TRICUSPID VALVE MV Area (PHT): 2.64 cm     TR Peak grad:   15.7 mmHg MV Decel Time: 287 msec     TR Vmax:        198.00 cm/s MV E velocity: 65.60 cm/s MV A velocity: 102.00 cm/s  SHUNTS MV E/A ratio:  0.64         Systemic VTI:  0.22 m                             Systemic Diam: 2.20 cm [name] MD Electronically signed by [name] MD Signature Date/Time: 07/13/2021/4:43:17 PM    Final         Scheduled Meds:  amiodarone  400 mg Oral BID   vitamin C  1,000 mg Oral Daily   aspirin EC  325 mg Oral Daily   atorvastatin  20 mg Oral QPM   brimonidine  1 drop Both Eyes BID   Chlorhexidine Gluconate Cloth  6 each Topical Q0600    cycloSPORINE  1 drop Both Eyes BID   enoxaparin (LOVENOX) injection  40 mg Subcutaneous Q24H   feeding supplement  237 mL Oral TID BM   finasteride  5 mg Oral Daily   insulin aspart  0-9 Units Subcutaneous TID WC   metoprolol succinate  50 mg Oral Daily   multivitamin with minerals  1 tablet Oral Daily   polyvinyl alcohol  1 drop Both Eyes BID   tamsulosin  0.4 mg Oral QPC supper   Continuous Infusions:  remdesivir 100 mg in NS 100 mL 100 mg (07/14/21 1055)     LOS: 2 days    Time spent: 49 minutes spent on chart review, discussion with nursing staff, consultants, updating family and interview/physical exam; more than 50% of that time was spent in counseling and/or coordination of care.    Meril Dray J [place] DO [facility] Available via Epic secure chat 7am-7pm After these hours, please refer to coverage provider listed on [email or web address] 07/14/2021, 4:33 PM

## 2021-07-14 NOTE — Evaluation (Signed)
Occupational Therapy Evaluation Patient Details Name: Raymond Fowler MRN: [id number] DOB: 03-21-1933 Today's Date: 07/14/2021   History of Present Illness Pt is an 88 y/o M presenting to ED on 2/12 with sore throat, nasal congestion, and dizziness. Found to have SVT and COVID +. PMH includes DM, HTN, osteoporosis and bilateral hearing loss   Clinical Impression   Pt and spouse present for session, pt reports independence at baseline with ADLs and uses cane outside the home for mobility. Pt currently min guard-min A for ADLs, supervision for bed mobility, and min A for transfers. Pt with mild unsteadiness with initial stand, RW used for transfers. Pt presenting with impairments listed below, will follow acutely. Recommend d/c home with assistance.     Recommendations for follow up therapy are one component of a multi-disciplinary discharge planning process, led by the attending physician.  Recommendations may be updated based on patient status, additional functional criteria and insurance authorization.   Follow Up Recommendations  No OT follow up    Assistance Recommended at Discharge Set up Supervision/Assistance  Patient can return home with the following A little help with walking and/or transfers;A little help with bathing/dressing/bathroom;Assistance with cooking/housework;Assist for transportation;Help with stairs or ramp for entrance    Functional Status Assessment  Patient has had a recent decline in their functional status and demonstrates the ability to make significant improvements in function in a reasonable and predictable amount of time.  Equipment Recommendations  BSC/3in1;Other (comment) (as shower seat)    Recommendations for Other Services       Precautions / Restrictions Precautions Precautions: Fall Restrictions Weight Bearing Restrictions: No      Mobility Bed Mobility Overal bed mobility: Needs Assistance Bed Mobility: Supine to Sit     Supine to sit:  Supervision          Transfers Overall transfer level: Needs assistance Equipment used: Rolling walker (2 wheels) Transfers: Sit to/from Stand Sit to Stand: Min assist                  Balance Overall balance assessment: Needs assistance Sitting-balance support: Feet supported Sitting balance-Leahy Scale: Good Sitting balance - Comments: sits EOB without LOB   Standing balance support: Bilateral upper extremity supported, During functional activity Standing balance-Leahy Scale: Fair Standing balance comment: stands statically at sink                           ADL either performed or assessed with clinical judgement   ADL Overall ADL's : Needs assistance/impaired Eating/Feeding: Set up;Sitting   Grooming: Oral care;Wash/dry hands;Wash/dry face;Standing Grooming Details (indicate cue type and reason): completed standing sinkside Upper Body Bathing: Sitting;Min guard   Lower Body Bathing: Minimal assistance;Sitting/lateral leans   Upper Body Dressing : Sitting;Min guard   Lower Body Dressing: Minimal assistance;Sitting/lateral leans   Toilet Transfer: Min guard;Rolling walker (2 wheels);Ambulation;Regular Toilet   Toileting- Water quality scientist and Hygiene: Supervision/safety;Sitting/lateral lean Toileting - Clothing Manipulation Details (indicate cue type and reason): completes pericare/clothing mgmt     Functional mobility during ADLs: Min guard;Rolling walker (2 wheels);Cueing for sequencing;Cueing for safety       Vision   Vision Assessment?: No apparent visual deficits     Perception     Praxis      Pertinent Vitals/Pain Pain Assessment Pain Assessment: No/denies pain     Hand Dominance     Extremity/Trunk Assessment Upper Extremity Assessment Upper Extremity Assessment: Generalized weakness   Lower Extremity Assessment  Lower Extremity Assessment: Defer to PT evaluation   Cervical / Trunk Assessment Cervical / Trunk  Assessment: Normal   Communication Communication Communication: HOH   Cognition Arousal/Alertness: Awake/alert Behavior During Therapy: WFL for tasks assessed/performed Overall Cognitive Status: Difficult to assess                                 General Comments: pt very hard of hearing, requiring written communication prior to wife's arrival with pt's hearing aids     General Comments  spouse present for half of session, pt needing increased cuing for safety and sequencing due to [employer]    Exercises     Shoulder Instructions      Home Living Family/patient expects to be discharged to:: Private residence Living Arrangements: Spouse/significant other Available Help at Discharge: Family Type of Home: House Home Access: Level entry     Home Layout: Two level;Able to live on main level with bedroom/bathroom     Bathroom Shower/Tub: Occupational psychologist: Standard     Home Equipment: Cane - single Barista (2 wheels)          Prior Functioning/Environment Prior Level of Function : Independent/Modified Independent             Mobility Comments: uses cane outdoors ADLs Comments: does IADLs        OT Problem List: Decreased strength;Decreased range of motion;Impaired balance (sitting and/or standing);Decreased activity tolerance      OT Treatment/Interventions: Self-care/ADL training;Therapeutic exercise;DME and/or AE instruction;Energy conservation;Patient/family education;Therapeutic activities    OT Goals(Current goals can be found in the care plan section) Acute Rehab OT Goals OT Goal Formulation: With patient Time For Goal Achievement: 07/28/21 Potential to Achieve Goals: Good ADL Goals Pt Will Perform Upper Body Dressing: with modified independence;sitting Pt Will Perform Lower Body Dressing: with modified independence;sit to/from stand Pt Will Transfer to Toilet: with modified independence;regular height  toilet;ambulating Pt Will Perform Tub/Shower Transfer: ambulating;3 in 1;rolling walker;with modified independence  OT Frequency: Min 2X/week    Co-evaluation              AM-PAC OT "6 Clicks" Daily Activity     Outcome Measure Help from another person eating meals?: None Help from another person taking care of personal grooming?: None Help from another person toileting, which includes using toliet, bedpan, or urinal?: A Little Help from another person bathing (including washing, rinsing, drying)?: A Little Help from another person to put on and taking off regular upper body clothing?: A Little Help from another person to put on and taking off regular lower body clothing?: A Lot 6 Click Score: 19   End of Session Equipment Utilized During Treatment: Gait belt;Rolling walker (2 wheels) Nurse Communication: Mobility status  Activity Tolerance: Patient tolerated treatment well Patient left: in bed;with call bell/phone within reach;with family/visitor present;Other (comment) (handoff to PT)  OT Visit Diagnosis: Unsteadiness on feet (R26.81);Other abnormalities of gait and mobility (R26.89);Muscle weakness (generalized) (M62.81)                Time: 6256-3893 OT Time Calculation (min): 28 min Charges:  OT General Charges $OT Visit: 1 Visit OT Evaluation $OT Eval Low Complexity: 1 Low OT Treatments $Self Care/Home Management : 8-22 mins  Lynnda Child, OTD, OTR/L Acute Rehab [phone] - [place] 07/14/2021, 4:35 PM

## 2021-07-14 NOTE — Assessment & Plan Note (Addendum)
TTE 2/14 with LVEF reduced at 30-35%, LV moderately decreased function with LV regional wall motion normalities, grade 1 diastolic dysfunction, LA mildly dilated, mild MR, no aortic stenosis, IVC dilated.  New diagnosis.  Unclear if tachycardia mediated.  LDL 48.  Cardiology was consulted and followed during hospital course.  Started on metoprolol succinate 50 mg p.o. daily, Entresto 24-26 mg p.o. twice daily.  Continue aspirin and statin.  Will likely need ischemic evaluation, cardiology planning outpatient.  Follow-up with cardiology in 2 weeks.

## 2021-07-14 NOTE — Evaluation (Signed)
Physical Therapy Evaluation Patient Details Name: Raymond Fowler MRN: [id number] DOB: 1933/03/14 Today's Date: 07/14/2021  History of Present Illness  Pt is an 88 y/o M presenting to ED on 2/12 with sore throat, nasal congestion, and dizziness. Found to have SVT and COVID +. PMH includes DM, HTN, osteoporosis and bilateral hearing loss  Clinical Impression  Pt admitted with above diagnosis. Pt was able to ambulate with RW in hallway with good stability overall. Can accept challenges to balance.  At times, pt rushing and needed cues to slow down.  Wife present and feels comfortable assisting pt at home and feels that pt will be safe using cane.  Pt and wife not interested in [facility] and this pt agrees that pt should progress well.  Pt currently with functional limitations due to the deficits listed below (see PT Problem List). Pt will benefit from skilled PT to increase their independence and safety with mobility to allow discharge to the venue listed below.          Recommendations for follow up therapy are one component of a multi-disciplinary discharge planning process, led by the attending physician.  Recommendations may be updated based on patient status, additional functional criteria and insurance authorization.  Follow Up Recommendations No PT follow up    Assistance Recommended at Discharge PRN  Patient can return home with the following  A little help with walking and/or transfers    Equipment Recommendations None recommended by PT  Recommendations for Other Services       Functional Status Assessment Patient has had a recent decline in their functional status and demonstrates the ability to make significant improvements in function in a reasonable and predictable amount of time.     Precautions / Restrictions Precautions Precautions: Fall Restrictions Weight Bearing Restrictions: No      Mobility  Bed Mobility Overal bed mobility: Needs Assistance Bed Mobility: Supine to  Sit     Supine to sit: Supervision     General bed mobility comments: incr time but no asssit given.    Transfers Overall transfer level: Needs assistance Equipment used: Rolling walker (2 wheels) Transfers: Sit to/from Stand Sit to Stand: Min assist           General transfer comment: Cues for hand placement and no assist for stability initiaily.    Ambulation/Gait Ambulation/Gait assistance: Min guard Gait Distance (Feet): 250 Feet Assistive device: Rolling walker (2 wheels) Gait Pattern/deviations: Step-through pattern, Decreased stride length, Trunk flexed   Gait velocity interpretation: <1.31 ft/sec, indicative of household ambulator   General Gait Details: Pt was able to ambulate into hallway with use of RW. Needed cues to stay close to RW as pt uses cane at home.  However pt did want the bil UE support today for stability.  Pt needs cues to slow down as well.  Stairs            Wheelchair Mobility    Modified Rankin (Stroke Patients Only)       Balance Overall balance assessment: Needs assistance Sitting-balance support: Feet supported Sitting balance-Leahy Scale: Good Sitting balance - Comments: sits EOB without LOB   Standing balance support: Bilateral upper extremity supported, During functional activity Standing balance-Leahy Scale: Fair Standing balance comment: Pt can stand statically at EOB.             High level balance activites: Direction changes, Turns, Sudden stops, Head turns High Level Balance Comments: Pt needed min guard assist for high level actitivities.  Pertinent Vitals/Pain Pain Assessment Pain Assessment: No/denies pain    Home Living Family/patient expects to be discharged to:: Private residence Living Arrangements: Spouse/significant other Available Help at Discharge: Family Type of Home: House Home Access: Level entry       Home Layout: Two level;Able to live on main level with  bedroom/bathroom Home Equipment: [place] - single Barista (2 wheels)      Prior Function Prior Level of Function : Independent/Modified Independent             Mobility Comments: uses cane outdoors ADLs Comments: does IADLs     Hand Dominance        Extremity/Trunk Assessment   Upper Extremity Assessment Upper Extremity Assessment: Defer to OT evaluation    Lower Extremity Assessment Lower Extremity Assessment: Generalized weakness    Cervical / Trunk Assessment Cervical / Trunk Assessment: Normal  Communication   Communication: HOH  Cognition Arousal/Alertness: Awake/alert Behavior During Therapy: WFL for tasks assessed/performed Overall Cognitive Status: Difficult to assess                                 General Comments: pt very hard of hearing, requiring written communication prior to wife's arrival with pt's hearing aids        General Comments General comments (skin integrity, edema, etc.): Spouse present    Exercises General Exercises - Lower Extremity Long Arc Quad: AROM, Both, 10 reps, Seated   Assessment/Plan    PT Assessment Patient needs continued PT services  PT Problem List Decreased balance;Decreased activity tolerance;Decreased mobility;Decreased strength;Decreased knowledge of use of DME;Decreased safety awareness;Decreased knowledge of precautions       PT Treatment Interventions DME instruction;Gait training;Functional mobility training;Therapeutic activities;Therapeutic exercise;Balance training;Patient/family education    PT Goals (Current goals can be found in the Care Plan section)  Acute Rehab PT Goals Patient Stated Goal: to go home PT Goal Formulation: With patient Time For Goal Achievement: 07/28/21 Potential to Achieve Goals: Good    Frequency Min 3X/week     Co-evaluation               AM-PAC PT "6 Clicks" Mobility  Outcome Measure Help needed turning from your back to your side while  in a flat bed without using bedrails?: None Help needed moving from lying on your back to sitting on the side of a flat bed without using bedrails?: A Little Help needed moving to and from a bed to a chair (including a wheelchair)?: A Little Help needed standing up from a chair using your arms (e.g., wheelchair or bedside chair)?: A Little Help needed to walk in hospital room?: A Little Help needed climbing 3-5 steps with a railing? : A Little 6 Click Score: 19    End of Session Equipment Utilized During Treatment: Gait belt Activity Tolerance: Patient tolerated treatment well Patient left: in chair;with call bell/phone within reach;with chair alarm set;with family/visitor present Nurse Communication: Mobility status PT Visit Diagnosis: Muscle weakness (generalized) (M62.81)    Time: 9381-0175 PT Time Calculation (min) (ACUTE ONLY): 23 min   Charges:   PT Evaluation $PT Eval Moderate Complexity: 1 Mod PT Treatments $Gait Training: 8-22 mins      Jaynia Fendley M,PT Acute Rehab Services [phone] (pager)   Alvira Philips 07/14/2021, 3:57 PM

## 2021-07-14 NOTE — Progress Notes (Signed)
Progress Note  Patient Name: Raymond Fowler Date of Encounter: 07/14/2021  Primary Cardiologist: Jenean Lindau, MD   Subjective   Patient was seen and examined at his bedside. He was sitting up in bed when I arrived. His wife is visiting.He offers no complaints at this time.  Inpatient Medications    Scheduled Meds:  amiodarone  400 mg Oral BID   vitamin C  1,000 mg Oral Daily   aspirin EC  325 mg Oral Daily   atorvastatin  20 mg Oral QPM   brimonidine  1 drop Both Eyes BID   Chlorhexidine Gluconate Cloth  6 each Topical Q0600   cycloSPORINE  1 drop Both Eyes BID   enoxaparin (LOVENOX) injection  40 mg Subcutaneous Q24H   feeding supplement  237 mL Oral TID BM   finasteride  5 mg Oral Daily   insulin aspart  0-9 Units Subcutaneous TID WC   metoprolol succinate  50 mg Oral Daily   multivitamin with minerals  1 tablet Oral Daily   polyvinyl alcohol  1 drop Both Eyes BID   tamsulosin  0.4 mg Oral QPC supper   Continuous Infusions:  remdesivir 100 mg in NS 100 mL 100 mg (07/14/21 1055)   PRN Meds: acetaminophen, ipratropium, labetalol, meclizine, ondansetron, phenol   Vital Signs    Vitals:   07/13/21 2332 07/14/21 0658 07/14/21 0750 07/14/21 1050  BP: 125/64 106/65 106/65   Pulse: (!) 59 (!) 58 62 67  Resp: 18 15 16    Temp: 97.7 F (36.5 C) 97.7 F (36.5 C) 99 F (37.2 C)   TempSrc: Oral Oral Oral   SpO2: 95% 97% 96%   Weight:      Height:        Intake/Output Summary (Last 24 hours) at 07/14/2021 1135 Last data filed at 07/14/2021 2229 Gross per 24 hour  Intake 1231.47 ml  Output 2840 ml  Net -1608.53 ml   Filed Weights   07/11/21 0930 07/11/21 1613 07/12/21 1600  Weight: 54.4 kg 52 kg 56.2 kg    Telemetry    Sinus rhythm - Personally Reviewed  ECG    None today - Personally Reviewed  Physical Exam    General: Comfortable, sitting up in a chair Head: Atraumatic, normal size  Eyes: PEERLA, EOMI  Neck: Supple, normal JVD Cardiac: Normal S1,  S2; RRR; no murmurs, rubs, or gallops Lungs: Clear to auscultation bilaterally Abd: Soft, nontender, no hepatomegaly  Ext: warm, no edema Musculoskeletal: No deformities, BUE and BLE strength normal and equal Skin: Warm and dry, no rashes   Neuro: Alert and oriented to person, place, time, and situation, CNII-XII grossly intact, no focal deficits  Psych: Normal mood and affect   Labs    Chemistry Recent Labs  Lab 07/11/21 0936 07/12/21 0240 07/13/21 0602 07/14/21 0333  NA 130* 132* 130* 129*  K 4.0 3.9 4.3 4.4  CL 96* 101 100 98  CO2 24 22 23 22   GLUCOSE 148* 121* 122* 147*  BUN 20 22 26* 23  CREATININE 1.12 1.12 1.01 0.94  CALCIUM 8.3* 8.0* 8.1* 8.0*  PROT 7.0  --  5.6* 5.7*  ALBUMIN 3.8  --  2.9* 2.9*  AST 35  --  22 19  ALT 23  --  21 20  ALKPHOS 33*  --  28* 33*  BILITOT 0.8  --  <0.1* 0.3  GFRNONAA >60 >60 >60 >60  ANIONGAP 10 9 7 9      Hematology Recent Labs  Lab 07/12/21 0240 07/13/21 0602 07/14/21 0333  WBC 6.1 6.8 7.5  RBC 3.88* 3.93* 3.85*   3.91*  HGB 11.9* 12.0* 11.9*  HCT 34.6* 35.0* 33.4*  MCV 89.2 89.1 86.8  MCH 30.7 30.5 30.9  MCHC 34.4 34.3 35.6  RDW 13.5 13.4 13.2  PLT 135* 170 170    Cardiac EnzymesNo results for input(s): TROPONINI in the last 168 hours. No results for input(s): TROPIPOC in the last 168 hours.   BNPNo results for input(s): BNP, PROBNP in the last 168 hours.   DDimer  Recent Labs  Lab 07/12/21 0240 07/13/21 0602 07/14/21 0333  DDIMER 0.85* 0.93* 0.78*     Radiology    DG CHEST PORT 1 VIEW  Result Date: 07/14/2021 CLINICAL DATA:  Shortness of breath. EXAM: PORTABLE CHEST 1 VIEW COMPARISON:  07/13/2021 and CT chest 02/19/2021. FINDINGS: Trachea is midline. Heart is at the upper limits of normal in size and accentuated by AP semi upright technique. Mild interstitial prominence. No airspace consolidation or pleural fluid. IMPRESSION: Question developing pulmonary edema. Electronically Signed   By: Lorin Picket  M.D.   On: 07/14/2021 08:22   DG CHEST PORT 1 VIEW  Result Date: 07/13/2021 CLINICAL DATA:  Sore throat, nasal congestion, COVID positive EXAM: PORTABLE CHEST 1 VIEW COMPARISON:  07/12/2011 FINDINGS: Mild cardiomegaly. Both lungs are clear. The visualized skeletal structures are unremarkable. IMPRESSION: Mild cardiomegaly. No acute abnormality of the lungs in AP portable projection. Electronically Signed   By: Delanna Ahmadi M.D.   On: 07/13/2021 08:06   ECHOCARDIOGRAM COMPLETE  Result Date: 07/13/2021    ECHOCARDIOGRAM REPORT   Patient Name:   Raymond Fowler Date of Exam: 07/13/2021 Medical Rec #:  [id number]   Height:       70.0 in Accession #:    [id number]  Weight:       123.8 lb Date of Birth:  [birth year]   BSA:          1.702 m Patient Age:    23 years    BP:           139/69 mmHg Patient Gender: M           HR:           64 bpm. Exam Location:  Inpatient Procedure: 2D Echo Indications:    Ventricular tachycardia  History:        Patient has no prior history of Echocardiogram examinations.                 Arrythmias:Tachycardia.  Sonographer:    Jefferey Pica Referring Phys: [id number] OMAIR LATIF [place]  1. Left ventricular ejection fraction, by estimation, is 30 to 35%. The left ventricle has moderately decreased function. The left ventricle demonstrates regional wall motion abnormalities (see scoring diagram/findings for description). Left ventricular  diastolic parameters are consistent with Grade I diastolic dysfunction (impaired relaxation).  2. Right ventricular systolic function is normal. The right ventricular size is normal. There is normal pulmonary artery systolic pressure. The estimated right ventricular systolic pressure is 30.8 mmHg.  3. Left atrial size was mildly dilated.  4. The mitral valve is degenerative. Mild mitral valve regurgitation. No evidence of mitral stenosis.  5. The aortic valve is tricuspid. Aortic valve regurgitation is mild. Aortic valve sclerosis is present,  with no evidence of aortic valve stenosis.  6. The inferior vena cava is dilated in size with <50% respiratory variability, suggesting right atrial pressure of 15 mmHg. FINDINGS  Left Ventricle:  Left ventricular ejection fraction, by estimation, is 30 to 35%. The left ventricle has moderately decreased function. The left ventricle demonstrates regional wall motion abnormalities. The left ventricular internal cavity size was normal in size. There is no left ventricular hypertrophy. Left ventricular diastolic parameters are consistent with Grade I diastolic dysfunction (impaired relaxation).  LV Wall Scoring: The apical septal segment, apical anterior segment, apical inferior segment, and apex are akinetic. Right Ventricle: The right ventricular size is normal. No increase in right ventricular wall thickness. Right ventricular systolic function is normal. There is normal pulmonary artery systolic pressure. The tricuspid regurgitant velocity is 1.98 m/s, and  with an assumed right atrial pressure of 15 mmHg, the estimated right ventricular systolic pressure is 09.3 mmHg. Left Atrium: Left atrial size was mildly dilated. Right Atrium: Right atrial size was normal in size. Pericardium: Trivial pericardial effusion is present. Mitral Valve: The mitral valve is degenerative in appearance. Mild mitral valve regurgitation. No evidence of mitral valve stenosis. Tricuspid Valve: The tricuspid valve is grossly normal. Tricuspid valve regurgitation is trivial. No evidence of tricuspid stenosis. Aortic Valve: The aortic valve is tricuspid. Aortic valve regurgitation is mild. Aortic regurgitation PHT measures 473 msec. Aortic valve sclerosis is present, with no evidence of aortic valve stenosis. Aortic valve peak gradient measures 7.3 mmHg. Pulmonic Valve: The pulmonic valve was grossly normal. Pulmonic valve regurgitation is not visualized. No evidence of pulmonic stenosis. Aorta: The aortic root is normal in size and structure.  Venous: The inferior vena cava is dilated in size with less than 50% respiratory variability, suggesting right atrial pressure of 15 mmHg. IAS/Shunts: The atrial septum is grossly normal.  LEFT VENTRICLE PLAX 2D LVIDd:         5.40 cm      Diastology LVIDs:         4.60 cm      LV e' medial:   3.88 cm/s LV PW:         1.10 cm      LV E/e' medial: 16.9 LV IVS:        1.10 cm LVOT diam:     2.20 cm LV SV:         85 LV SV Index:   50 LVOT Area:     3.80 cm  LV Volumes (MOD) LV vol d, MOD A4C: 146.0 ml LV vol s, MOD A4C: 99.1 ml LV SV MOD A4C:     146.0 ml RIGHT VENTRICLE            IVC RV S prime:     9.57 cm/s  IVC diam: 2.20 cm TAPSE (M-mode): 2.2 cm LEFT ATRIUM           Index        RIGHT ATRIUM           Index LA diam:      3.40 cm 2.00 cm/m   RA Area:     16.20 cm LA Vol (A2C): 46.4 ml 27.26 ml/m  RA Volume:   39.40 ml  23.14 ml/m LA Vol (A4C): 68.6 ml 40.30 ml/m  AORTIC VALVE                 PULMONIC VALVE AV Area (Vmax): 2.78 cm     PV Vmax:       0.68 m/s AV Vmax:        135.00 cm/s  PV Peak grad:  1.9 mmHg AV Peak Grad:   7.3 mmHg LVOT Vmax:  98.60 cm/s LVOT Vmean:     62.900 cm/s LVOT VTI:       0.223 m AI PHT:         473 msec  AORTA Ao Root diam: 3.40 cm MITRAL VALVE                TRICUSPID VALVE MV Area (PHT): 2.64 cm     TR Peak grad:   15.7 mmHg MV Decel Time: 287 msec     TR Vmax:        198.00 cm/s MV E velocity: 65.60 cm/s MV A velocity: 102.00 cm/s  SHUNTS MV E/A ratio:  0.64         Systemic VTI:  0.22 m                             Systemic Diam: 2.20 cm [name] MD Electronically signed by [name] MD Signature Date/Time: 07/13/2021/4:43:17 PM    Final     Cardiac Studies   TTE 07/14/2021 IMPRESSIONS   1. Left ventricular ejection fraction, by estimation, is 30 to 35%. The  left ventricle has moderately decreased function. The left ventricle  demonstrates regional wall motion abnormalities (see scoring diagram/findings for description). Left ventricular   diastolic  parameters are consistent with Grade I diastolic dysfunction (impaired relaxation).   2. Right ventricular systolic function is normal. The right ventricular size is normal. There is normal pulmonary artery systolic pressure. The estimated right ventricular systolic pressure is 16.6 mmHg.  3. Left atrial size was mildly dilated.   4. The mitral valve is degenerative. Mild mitral valve regurgitation. No evidence of mitral stenosis.   5. The aortic valve is tricuspid. Aortic valve regurgitation is mild.  Aortic valve sclerosis is present, with no evidence of aortic valve  stenosis.   6. The inferior vena cava is dilated in size with <50% respiratory  variability, suggesting right atrial pressure of 15 mmHg.   FINDINGS   Left Ventricle: Left ventricular ejection fraction, by estimation, is 30  to 35%. The left ventricle has moderately decreased function. The left  ventricle demonstrates regional wall motion abnormalities. The left  ventricular internal cavity size was  normal in size. There is no left ventricular hypertrophy. Left ventricular  diastolic parameters are consistent with Grade I diastolic dysfunction  (impaired relaxation).      LV Wall Scoring:  The apical septal segment, apical anterior segment, apical inferior  segment,  and apex are akinetic.   Right Ventricle: The right ventricular size is normal. No increase in  right ventricular wall thickness. Right ventricular systolic function is  normal. There is normal pulmonary artery systolic pressure. The tricuspid  regurgitant velocity is 1.98 m/s, and   with an assumed right atrial pressure of 15 mmHg, the estimated right  ventricular systolic pressure is 06.3 mmHg.   Left Atrium: Left atrial size was mildly dilated.   Right Atrium: Right atrial size was normal in size.   Pericardium: Trivial pericardial effusion is present.   Mitral Valve: The mitral valve is degenerative in appearance. Mild mitral  valve  regurgitation. No evidence of mitral valve stenosis.   Tricuspid Valve: The tricuspid valve is grossly normal. Tricuspid valve  regurgitation is trivial. No evidence of tricuspid stenosis.   Aortic Valve: The aortic valve is tricuspid. Aortic valve regurgitation is  mild. Aortic regurgitation PHT measures 473 msec. Aortic valve sclerosis  is present, with no evidence of aortic  valve stenosis. Aortic valve peak  gradient measures 7.3 mmHg.   Pulmonic Valve: The pulmonic valve was grossly normal. Pulmonic valve  regurgitation is not visualized. No evidence of pulmonic stenosis.   Aorta: The aortic root is normal in size and structure.   Venous: The inferior vena cava is dilated in size with less than 50%  respiratory variability, suggesting right atrial pressure of 15 mmHg.   IAS/Shunts: The atrial septum is grossly normal.      Patient Profile     87 y.o. male COVID-19 infection with paroxysmal SVT now noted to have dilated cardiomyopathy EF 30 to 35%.  Assessment & Plan    Dilated cardiomyopathy EF 30-35% Heart failure with reduced ejection fraction COVID-19 infection Paroxysmal SVT on amiodarone  It appears that the depressed ejection fraction is on new 30 to 35% with wall motion abnormalities cannot rule out ischemic etiology although there is also suspicion for tachycardia mediated.  Currently he does not have any significant symptoms or angina.  He will need an ischemic evaluation which can be addressed in the outpatient setting.  In the meantime we will continue patient on his aspirin and atorvastatin. For his cardiomyopathy he is currently on Toprol-XL 50 mg daily, his blood pressure is on the lower end.  If this improves will need to add Entresto 24-26 mg and eventually Aldactone as well as [place].  In terms of his SVT he has done well with the increased dose of amiodarone.  We will keep him on that for now.   For questions or updates, please contact [place] Please consult [email or web address] for contact info under Cardiology/STEMI.      Signed, Berniece Salines, DO  07/14/2021, 11:35 AM

## 2021-07-15 LAB — BASIC METABOLIC PANEL
Anion gap: 7 (ref 5–15)
BUN: 21 mg/dL (ref 8–23)
CO2: 24 mmol/L (ref 22–32)
Calcium: 8 mg/dL — ABNORMAL LOW (ref 8.9–10.3)
Chloride: 99 mmol/L (ref 98–111)
Creatinine, Ser: 0.95 mg/dL (ref 0.61–1.24)
GFR, Estimated: >60 mL/min (ref >60)
Glucose, Bld: 130 mg/dL — ABNORMAL HIGH (ref 70–99)
Potassium: 4.8 mmol/L (ref 3.5–5.1)
Sodium: 130 mmol/L — ABNORMAL LOW (ref 135–145)

## 2021-07-15 LAB — URINE CULTURE: Culture: >=100000 — AB

## 2021-07-15 LAB — GLUCOSE, CAPILLARY
Glucose-Capillary: 126 mg/dL — ABNORMAL HIGH (ref 70–99)
Glucose-Capillary: 146 mg/dL — ABNORMAL HIGH (ref 70–99)

## 2021-07-15 LAB — PHOSPHORUS: Phosphorus: 2.4 mg/dL — ABNORMAL LOW (ref 2.5–4.6)

## 2021-07-15 LAB — MAGNESIUM: Magnesium: 2 mg/dL (ref 1.7–2.4)

## 2021-07-15 MED ORDER — METOPROLOL SUCCINATE ER 50 MG PO TB24
50.0000 mg | ORAL_TABLET | Freq: Every day | ORAL | 2 refills | Status: DC
Start: 2021-07-16 — End: 2021-08-09

## 2021-07-15 MED ORDER — AMIODARONE HCL 200 MG PO TABS: ORAL_TABLET | ORAL | 0 refills | Status: DC

## 2021-07-15 MED ORDER — ENTRESTO 24-26 MG PO TABS: 1.0000 | ORAL_TABLET | Freq: Two times a day (BID) | ORAL | 2 refills | Status: DC

## 2021-07-15 MED ORDER — K PHOS MONO-SOD PHOS DI & MONO 155-852-130 MG PO TABS
500.0000 mg | ORAL_TABLET | Freq: Once | ORAL | Status: DC
  Filled 2021-07-15 (×2): qty 2

## 2021-07-15 NOTE — TOC Initial Note (Signed)
Transition of Care [facility]) - Initial/Assessment Note    Patient Details  Name: Raymond Fowler MRN: [id number] Date of Birth: [birth year]  [facility] CM/SW Contact:    Bethena Roys, RN Phone Number: 07/15/2021, 10:25 AM  Clinical Narrative: Risk for readmission assessment completed. Case Manager spoke with patient and spouse-the plan will be to return home without home health services. Spouse states that the patient ambulates without any issues and that the bathroom is near the bedroom. Spouse feels that the patient will not need DME 3n1 for home. Case Manager did make MD and staff RN aware. No further needs from Case Manager at this time.                Expected Discharge Plan: Home/Self Care Barriers to Discharge: No Barriers Identified   Patient Goals and CMS Choice Patient states their goals for this hospitalization and ongoing recovery are:: to return home   Choice offered to / list presented to : NA  Expected Discharge Plan and Services Expected Discharge Plan: Home/Self Care In-house Referral: Clinical Social Work Discharge Planning Services: CM Consult Post Acute Care Choice: NA Living arrangements for the past 2 months: Single Family Home Expected Discharge Date: 07/15/21               DME Arranged: N/A DME Agency: NA       HH Arranged: NA          Prior Living Arrangements/Services Living arrangements for the past 2 months: Single Family Home Lives with:: Spouse Patient language and need for interpreter reviewed:: Yes Do you feel safe going back to the place where you live?: Yes      Need for Family Participation in Patient Care: Yes (Comment) Care giver support system in place?: Yes (comment)   Criminal Activity/Legal Involvement Pertinent to Current Situation/Hospitalization: No - Comment as needed  Activities of Daily Living Home Assistive Devices/Equipment: None ADL Screening (condition at time of admission) Patient's cognitive  ability adequate to safely complete daily activities?: Yes Is the patient deaf or have difficulty hearing?: Yes Does the patient have difficulty seeing, even when wearing glasses/contacts?: No Does the patient have difficulty concentrating, remembering, or making decisions?: No Patient able to express need for assistance with ADLs?: Yes Does the patient have difficulty dressing or bathing?: No Independently performs ADLs?: Yes (appropriate for developmental age) Does the patient have difficulty walking or climbing stairs?: No Weakness of Legs: None Weakness of Arms/Hands: None  Permission Sought/Granted Permission sought to share information with : Family Supports, Case Manager Permission granted to share information with : Yes, Verbal Permission Granted              Emotional Assessment Appearance:: Appears stated age Attitude/Demeanor/Rapport: Engaged Affect (typically observed): Appropriate Orientation: : Oriented to Self, Oriented to Place, Oriented to  Time, Oriented to Situation Alcohol / Substance Use: Not Applicable Psych Involvement: No (comment)  Admission diagnosis:  Tachycardia, unspecified [R00.0] SVT (supraventricular tachycardia) (HCC) [I47.1] Hypotension, unspecified hypotension type [I95.9] COVID-19 [U07.1] Patient Active Problem List   Diagnosis Date Noted   Dilated cardiomyopathy ([place]) 07/14/2021   Hypophosphatemia 07/13/2021   Subclinical hypothyroidism 07/13/2021   Protein-calorie malnutrition, severe 07/12/2021   Hypomagnesemia 07/12/2021   BPH (benign prostatic hyperplasia) 07/12/2021   COVID-19 virus infection 07/12/2021   Hyponatremia 07/12/2021   Thrombocytopenia ([place]) 07/12/2021   Normocytic anemia 07/12/2021   SVT (supraventricular tachycardia) ([place]) 07/11/2021   Palpitations 07/09/2021   Cardiac murmur 07/09/2021   Supraventricular tachycardia (  [place]) 07/09/2021   Diet-controlled diabetes mellitus ([place]) 07/09/2021   Diabetes mellitus without  complication ([place]) 78/67/5449   Essential hypertension 07/06/2021   Frailty 07/06/2021   Hardening of the aorta (main artery of the heart) ([place]) 07/06/2021   Osteoporosis 07/06/2021   Pathological fracture of vertebra 07/06/2021   Protein calorie malnutrition ([place]) 07/06/2021   Pure hypercholesterolemia 07/06/2021   Type 2 diabetes mellitus with other circulatory complications ([place]) 20/02/711   Wears hearing aid in right ear 03/10/2020   Cochlear implant in place 02/24/2020   Sensorineural hearing loss (SNHL) of both ears 02/27/2018   Cerebrovascular disease 11/04/2016   Dizziness and giddiness 11/04/2016   Bilateral hearing loss 07/29/2015   Bilateral impacted cerumen 07/29/2015   PCP:  Orpah Melter, MD Pharmacy:   [id number] - [place], [place] [address] [place] [address] Phone: [phone] Fax: [phone]  Readmission Risk Interventions Readmission Risk Prevention Plan 07/15/2021  Transportation Screening Complete  PCP or Specialist Appt within 3-5 Days Complete  HRI or Home Care Consult Complete  Social Work Consult for [place] Planning/Counseling Complete  Palliative Care Screening Not Applicable  Medication Review Press photographer) Complete  Some recent data might be hidden

## 2021-07-15 NOTE — Discharge Summary (Signed)
Physician Discharge Summary  Raymond Fowler VEH:[id number] DOB: 12-07-1932 DOA: 07/11/2021  PCP: Orpah Melter, MD  Admit date: 07/11/2021 Discharge date: 07/15/2021  Admitted From: Home Disposition: Home  Recommendations for Outpatient Follow-up:  Follow up with PCP in 1-2 weeks Follow-up with cardiology, Dr. Geraldo Pitter in 2 weeks. Outpatient follow-up with urology as scheduled Metoprolol succinate increased to 50 mg p.o. daily Started on Entresto 24-26 mg p.o. twice daily Started on amiodarone Please obtain BMP in one week to assess sodium level and renal function Obtain repeat TFTs in 4-6 weeks for subclinical hypothyroidism  Home Health: No Equipment/Devices: Foley catheter which is chronic  Discharge Condition: Stable CODE STATUS: Full code Diet recommendation: Heart healthy/consistent carb regular diet  History of present illness:  Raymond Fowler is a 88 year old male with past medical history significant for essential hypertension, type 2 diabetes mellitus, BPH with chronic obstructive uropathy s/p Foley catheter, hard of hearing, chronic dizziness who presented to [facility] ED on 2/12 with palpitations and worsening dizziness.  Onset roughly 6 days prior to ED presentation with associated dizziness, nausea.  Was initially seen in the ED, found to be borderline tachycardic and dehydrated, given IV fluid hydration with normalization of his heart rate was discharged home.  Over the next few days, patient continued to experience frequent episodes of lightheadedness with palpitations and increasing frequency of nausea without vomiting.  Denies chest pain or shortness of breath.  In the ED, sodium 130, potassium 4.0, chloride 96, CO2 24, glucose 148, BUN 20, creatinine 1.12.  AST 35, ALT 23, total bilirubin 0.8.  WBC 7.5, hemoglobin 13.2, platelets 182.  COVID-19 PCR positive.  Influenza A/B PCR negative.  Patient was found to be in SVT, patient was given 6 mg IV Adazin with EKG showing AVNRT.   Chest x-ray negative for infiltrates.  COVID-19 PCR was notably positive.  Patient was started on amiodarone drip.  Cardiology consulted.  TRH consulted for further evaluation and management of SVT.  Hospital course:  Assessment and Plan: * SVT (supraventricular tachycardia) ([place])- (present on admission) Patient presenting to ED with recurrent palpitations and was found to be in SVT.  Patient was given 6 mg IV adenosine x2 with termination of rhythm.  Cardiology was consulted and patient was initially started on amiodarone drip.  TTE with LVEF 30-35%, LV moderately decreased function, LV with regional wall motion abnormalities, grade 1 diastolic dysfunction, LA mildly dilated, mild MR, no aortic stenosis, IVC dilated.  Cardiology was consulted and followed during hospital course.  Patient was started on amiodarone and will transition to 400 mg p.o. twice daily for additional 5 days following hospitalization followed by 40 mg p.o. daily.  Also will continue metoprolol succinate 50 mg p.o. daily.  Outpatient follow-up with cardiology in 2 weeks.  Dilated cardiomyopathy ([place])- (present on admission) TTE 2/14 with LVEF reduced at 30-35%, LV moderately decreased function with LV regional wall motion normalities, grade 1 diastolic dysfunction, LA mildly dilated, mild MR, no aortic stenosis, IVC dilated.  New diagnosis.  Unclear if tachycardia mediated.  LDL 48.  Cardiology was consulted and followed during hospital course.  Started on metoprolol succinate 50 mg p.o. daily, Entresto 24-26 mg p.o. twice daily.  Continue aspirin and statin.  Will likely need ischemic evaluation, cardiology planning outpatient.  Follow-up with cardiology in 2 weeks.  COVID-19 virus infection COVID-19 PCR positive on admission, incidental finding.  Chest x-ray with no acute cardiopulmonary disease process and oxygenating well on room air.  Completed 5-day course of IV remdesivir  while inpatient.   Hypomagnesemia Repleted during  hospitalization.    Hyponatremia- (present on admission) Urine sodium 74, urine osmolality 264.  Recommend repeat BMP 1 week.  Thrombocytopenia ([place])- (present on admission) Platelets dropped from 182 to 135; now up to 170; stable.  On aspirin as above.  Diet-controlled diabetes mellitus ([place]) Diet controlled at home.  Hemoglobin A1c 6.3 on 07/11/2021, well controlled.  BPH (benign prostatic hyperplasia)- (present on admission) On admission with moderate leukocytes, positive nitrite, many bacteria, 6-10 WBCs. Urine culture with Staph aureus, also with mixed; asymptomatic; suspect colonization.  Continue Foley catheter.  Has outpatient follow-up scheduled with urology.  Continue finasteride and Rapaflo.  Normocytic anemia Anemia panel with iron 30, TIBC 252, ferritin 133, folate 25.5, vitamin B12 1141; etiology likely secondary to anemia of chronic medical disease.  Hemoglobin stable, 11.9.  Hypophosphatemia Repleted during hospitalization, repeat phosphorus level 2.8, within normal limits.  Subclinical hypothyroidism- (present on admission) TSH elevated at 10.609; free T41.12 And free T3 92 which is within normal limits. Repeat TFTs in 4-6 weeks and start Levothyroxine as an outpatient if still elevated.  Bilateral hearing loss- (present on admission) Supportive care  Protein-calorie malnutrition, severe- (present on admission) -Nutrition Status: Nutrition Problem: Severe Malnutrition Etiology: social / environmental circumstances (inadequate energy intake) Signs/Symptoms: severe fat depletion, severe muscle depletion Interventions: Refer to RD note for recommendations  Dietitian was consulted and followed during hospital course. Continue to encourage increased oral intake, supplementation.         Discharge Diagnoses:  Principal Problem:   SVT (supraventricular tachycardia) (HCC) Active Problems:   Dilated cardiomyopathy ([place])   COVID-19 virus infection    Hypomagnesemia   Hyponatremia   Thrombocytopenia (HCC)   Diet-controlled diabetes mellitus (HCC)   BPH (benign prostatic hyperplasia)   Normocytic anemia   Hypophosphatemia   Subclinical hypothyroidism   Bilateral hearing loss   Protein-calorie malnutrition, severe    Discharge Instructions  Discharge Instructions     Call MD for:  difficulty breathing, headache or visual disturbances   Complete by: As directed    Call MD for:  extreme fatigue   Complete by: As directed    Call MD for:  persistant dizziness or light-headedness   Complete by: As directed    Call MD for:  persistant nausea and vomiting   Complete by: As directed    Call MD for:  severe uncontrolled pain   Complete by: As directed    Call MD for:  temperature >100.4   Complete by: As directed    Diet - low sodium heart healthy   Complete by: As directed    Increase activity slowly   Complete by: As directed    No wound care   Complete by: As directed       Allergies as of 07/15/2021   No Known Allergies      Medication List     STOP taking these medications    Chromium Picolinate 1000 MCG Tabs   Pfizer COVID-19 Vac Bivalent injection Generic drug: COVID-19 mRNA bivalent vaccine Therapist, music)       TAKE these medications    alendronate 70 MG tablet Commonly known as: FOSAMAX Take 70 mg by mouth once a week.   amiodarone 200 MG tablet Commonly known as: Pacerone Take 2 tablets (400 mg total) by mouth 2 (two) times daily for 5 days, THEN 2 tablets (400 mg total) daily. Start taking on: July 15, 2021   aspirin EC 325 MG tablet Take 325 mg  by mouth daily.   atorvastatin 20 MG tablet Commonly known as: LIPITOR Take 20 mg by mouth every evening.   brimonidine 0.2 % ophthalmic solution Commonly known as: ALPHAGAN Place 1 drop into both eyes 2 (two) times daily.   carboxymethylcellul-glycerin 0.5-0.9 % ophthalmic solution Commonly known as: REFRESH OPTIVE Place 1 drop into both eyes 2  (two) times daily.   cycloSPORINE 0.05 % ophthalmic emulsion Commonly known as: RESTASIS Place 1 drop into both eyes 2 (two) times daily.   Entresto 24-26 MG Generic drug: sacubitril-valsartan Take 1 tablet by mouth 2 (two) times daily.   finasteride 5 MG tablet Commonly known as: PROSCAR Take 5 mg by mouth daily.   Fish Oil 1200 MG Caps Take 1,200 mg by mouth daily.   meclizine 25 MG tablet Commonly known as: ANTIVERT Take 1 tablet (25 mg total) by mouth 3 (three) times daily as needed for dizziness.   metoprolol succinate 50 MG 24 hr tablet Commonly known as: TOPROL-XL Take 1 tablet (50 mg total) by mouth daily. Take with or immediately following a meal. Start taking on: July 16, 2021 What changed:  medication strength how much to take additional instructions   multivitamin with minerals tablet Take 1 tablet by mouth daily.   ondansetron 4 MG disintegrating tablet Commonly known as: ZOFRAN-ODT Take 1 tablet (4 mg total) by mouth every 8 (eight) hours as needed for nausea or vomiting.   silodosin 8 MG Caps capsule Commonly known as: RAPAFLO Take 8 mg by mouth daily.   vitamin C 1000 MG tablet Take 1,000 mg by mouth daily.               Durable Medical Equipment  (From admission, onward)           Start     Ordered   07/14/21 1630  For home use only DME 3 n 1  Once        07/14/21 1631            Follow-up Information     Orpah Melter, MD. Schedule an appointment as soon as possible for a visit in 1 week(s).   Specialty: Family Medicine Contact information: [place] [address] [phone]         Revankar, Reita Cliche, MD. Schedule an appointment as soon as possible for a visit in 2 week(s).   Specialty: Cardiology Contact information: [place] [address] [phone]                No Known Allergies  Consultations: Cardiology, Dr. Harriet Masson   Procedures/Studies: DG CHEST PORT  1 VIEW  Result Date: 07/14/2021 CLINICAL DATA:  Shortness of breath. EXAM: PORTABLE CHEST 1 VIEW COMPARISON:  07/13/2021 and CT chest 02/19/2021. FINDINGS: Trachea is midline. Heart is at the upper limits of normal in size and accentuated by AP semi upright technique. Mild interstitial prominence. No airspace consolidation or pleural fluid. IMPRESSION: Question developing pulmonary edema. Electronically Signed   By: Lorin Picket M.D.   On: 07/14/2021 08:22   DG CHEST PORT 1 VIEW  Result Date: 07/13/2021 CLINICAL DATA:  Sore throat, nasal congestion, COVID positive EXAM: PORTABLE CHEST 1 VIEW COMPARISON:  07/12/2011 FINDINGS: Mild cardiomegaly. Both lungs are clear. The visualized skeletal structures are unremarkable. IMPRESSION: Mild cardiomegaly. No acute abnormality of the lungs in AP portable projection. Electronically Signed   By: Delanna Ahmadi M.D.   On: 07/13/2021 08:06   DG Chest Portable 1  View  Result Date: 07/11/2021 CLINICAL DATA:  88 year old male with history of lightheadedness. Tachycardia. EXAM: PORTABLE CHEST 1 VIEW COMPARISON:  Chest x-ray 07/05/2021. FINDINGS: Lung volumes are normal. No consolidative airspace disease. No pleural effusions. No pneumothorax. Tiny calcified granulomas are noted in the right mid to upper lung and left lung base. No other suspicious appearing pulmonary nodule or mass noted. Pulmonary vasculature and the cardiomediastinal silhouette are within normal limits. IMPRESSION: No radiographic evidence of acute cardiopulmonary disease. Electronically Signed   By: Vinnie Langton M.D.   On: 07/11/2021 11:10   DG Chest Port 1 View  Result Date: 07/05/2021 CLINICAL DATA:  Nausea and tachycardia in an 88 year old male. EXAM: PORTABLE CHEST 1 VIEW COMPARISON:  September 07, 2006. FINDINGS: EKG leads project over the chest. Cardiomediastinal contours and hilar structures are normal. Lungs are clear.  No visible pneumothorax. No sign of effusion on frontal radiograph. On  limited assessment there is no acute skeletal finding. IMPRESSION: No acute cardiopulmonary disease. Electronically Signed   By: Zetta Bills M.D.   On: 07/05/2021 13:13   ECHOCARDIOGRAM COMPLETE  Result Date: 07/13/2021    ECHOCARDIOGRAM REPORT   Patient Name:   Raymond Fowler Date of Exam: 07/13/2021 Medical Rec #:  [id number]   Height:       70.0 in Accession #:    [id number]  Weight:       123.8 lb Date of Birth:  1933-01-06   BSA:          1.702 m Patient Age:    84 years    BP:           139/69 mmHg Patient Gender: M           HR:           64 bpm. Exam Location:  Inpatient Procedure: 2D Echo Indications:    Ventricular tachycardia  History:        Patient has no prior history of Echocardiogram examinations.                 Arrythmias:Tachycardia.  Sonographer:    Jefferey Pica Referring Phys: [id number] OMAIR LATIF Bodega  1. Left ventricular ejection fraction, by estimation, is 30 to 35%. The left ventricle has moderately decreased function. The left ventricle demonstrates regional wall motion abnormalities (see scoring diagram/findings for description). Left ventricular  diastolic parameters are consistent with Grade I diastolic dysfunction (impaired relaxation).  2. Right ventricular systolic function is normal. The right ventricular size is normal. There is normal pulmonary artery systolic pressure. The estimated right ventricular systolic pressure is 78.2 mmHg.  3. Left atrial size was mildly dilated.  4. The mitral valve is degenerative. Mild mitral valve regurgitation. No evidence of mitral stenosis.  5. The aortic valve is tricuspid. Aortic valve regurgitation is mild. Aortic valve sclerosis is present, with no evidence of aortic valve stenosis.  6. The inferior vena cava is dilated in size with <50% respiratory variability, suggesting right atrial pressure of 15 mmHg. FINDINGS  Left Ventricle: Left ventricular ejection fraction, by estimation, is 30 to 35%. The left ventricle has  moderately decreased function. The left ventricle demonstrates regional wall motion abnormalities. The left ventricular internal cavity size was normal in size. There is no left ventricular hypertrophy. Left ventricular diastolic parameters are consistent with Grade I diastolic dysfunction (impaired relaxation).  LV Wall Scoring: The apical septal segment, apical anterior segment, apical inferior segment, and apex are akinetic. Right Ventricle: The right ventricular size is normal.  No increase in right ventricular wall thickness. Right ventricular systolic function is normal. There is normal pulmonary artery systolic pressure. The tricuspid regurgitant velocity is 1.98 m/s, and  with an assumed right atrial pressure of 15 mmHg, the estimated right ventricular systolic pressure is 47.0 mmHg. Left Atrium: Left atrial size was mildly dilated. Right Atrium: Right atrial size was normal in size. Pericardium: Trivial pericardial effusion is present. Mitral Valve: The mitral valve is degenerative in appearance. Mild mitral valve regurgitation. No evidence of mitral valve stenosis. Tricuspid Valve: The tricuspid valve is grossly normal. Tricuspid valve regurgitation is trivial. No evidence of tricuspid stenosis. Aortic Valve: The aortic valve is tricuspid. Aortic valve regurgitation is mild. Aortic regurgitation PHT measures 473 msec. Aortic valve sclerosis is present, with no evidence of aortic valve stenosis. Aortic valve peak gradient measures 7.3 mmHg. Pulmonic Valve: The pulmonic valve was grossly normal. Pulmonic valve regurgitation is not visualized. No evidence of pulmonic stenosis. Aorta: The aortic root is normal in size and structure. Venous: The inferior vena cava is dilated in size with less than 50% respiratory variability, suggesting right atrial pressure of 15 mmHg. IAS/Shunts: The atrial septum is grossly normal.  LEFT VENTRICLE PLAX 2D LVIDd:         5.40 cm      Diastology LVIDs:         4.60 cm      LV  e' medial:   3.88 cm/s LV PW:         1.10 cm      LV E/e' medial: 16.9 LV IVS:        1.10 cm LVOT diam:     2.20 cm LV SV:         85 LV SV Index:   50 LVOT Area:     3.80 cm  LV Volumes (MOD) LV vol d, MOD A4C: 146.0 ml LV vol s, MOD A4C: 99.1 ml LV SV MOD A4C:     146.0 ml RIGHT VENTRICLE            IVC RV S prime:     9.57 cm/s  IVC diam: 2.20 cm TAPSE (M-mode): 2.2 cm LEFT ATRIUM           Index        RIGHT ATRIUM           Index LA diam:      3.40 cm 2.00 cm/m   RA Area:     16.20 cm LA Vol (A2C): 46.4 ml 27.26 ml/m  RA Volume:   39.40 ml  23.14 ml/m LA Vol (A4C): 68.6 ml 40.30 ml/m  AORTIC VALVE                 PULMONIC VALVE AV Area (Vmax): 2.78 cm     PV Vmax:       0.68 m/s AV Vmax:        135.00 cm/s  PV Peak grad:  1.9 mmHg AV Peak Grad:   7.3 mmHg LVOT Vmax:      98.60 cm/s LVOT Vmean:     62.900 cm/s LVOT VTI:       0.223 m AI PHT:         473 msec  AORTA Ao Root diam: 3.40 cm MITRAL VALVE                TRICUSPID VALVE MV Area (PHT): 2.64 cm     TR Peak grad:   15.7 mmHg MV Decel Time:  287 msec     TR Vmax:        198.00 cm/s MV E velocity: 65.60 cm/s MV A velocity: 102.00 cm/s  SHUNTS MV E/A ratio:  0.64         Systemic VTI:  0.22 m                             Systemic Diam: 2.20 cm [name] MD Electronically signed by [name] MD Signature Date/Time: 07/13/2021/4:43:17 PM    Final      Subjective: Patient seen examined at bedside, resting comfortably.  Spouse present.  Remains very hard of hearing.  No specific complaints this morning.  Completed remdesivir infusion.  Discussed with cardiology, recommending starting Entresto in addition to increased dose of metoprolol XL and continue amiodarone.  Cardiology recommends outpatient follow-up for likely need of ischemic evaluation outpatient.  No other questions or concerns from patient or spouse at this time.  Denies headache, no chest pain, no palpitations, no shortness of breath, no abdominal pain,  no fever/chills/night  sweats.  No acute concerns overnight per nursing staff.  Discharge Exam: Vitals:   07/15/21 0750 07/15/21 0820  BP: (!) 144/75   Pulse:  62  Resp: 16   Temp: 98.1 F (36.7 C)   SpO2:     Vitals:   07/15/21 0012 07/15/21 0448 07/15/21 0750 07/15/21 0820  BP: 118/68 (!) 152/76 (!) 144/75   Pulse: (!) 52 (!) 56  62  Resp: 18  16   Temp: (!) 96.9 F (36.1 C) 97.7 F (36.5 C) 98.1 F (36.7 C)   TempSrc: Axillary Axillary Oral   SpO2: 95% 97%    Weight:  54.3 kg    Height:        Physical Exam: GEN: NAD, alert and oriented x 3, thin/cachectic in appearance, very hard of hearing HEENT: NCAT, PERRL, EOMI, sclera clear, MMM PULM: CTAB w/o wheezes/crackles, normal respiratory effort, on room air CV: RRR w/o M/G/R GI: abd soft, NTND, NABS, no R/G/M MSK: no peripheral edema, muscle strength globally intact 5/5 bilateral upper/lower extremities NEURO: CN II-XII intact, no focal deficits, sensation to light touch intact PSYCH: normal mood/affect Integumentary: dry/intact, no rashes or wounds    The results of significant diagnostics from this hospitalization (including imaging, microbiology, ancillary and laboratory) are listed below for reference.     Microbiology: Recent Results (from the past 240 hour(s))  Culture, blood (Routine X 2) w Reflex to ID Panel     Status: None   Collection Time: 07/05/21 12:45 PM   Specimen: BLOOD  Result Value Ref Range Status   Specimen Description   Final    BLOOD Blood Culture results may not be optimal due to an inadequate volume of blood received in culture bottles Performed at [facility], [address] [place], [place] [address]    Special Requests   Final    AEROBIC BOTTLE ONLY BLOOD RIGHT FOREARM Performed at [facility], [address], [place], [place] [address]    Culture   Final    NO GROWTH 5 DAYS Performed at [facility], [place] [address], [place], [place] [address]    Report Status  07/10/2021 FINAL  Final  Urine Culture     Status: Abnormal   Collection Time: 07/05/21 12:49 PM   Specimen: Urine, Clean Catch  Result Value Ref Range Status   Specimen Description  Final    URINE, CLEAN CATCH Performed at [facility], [address] [place], [place] [address]    Special Requests   Final    NONE Performed at [facility], [address] [place], [place] [address]    Culture >=100,000 COLONIES/mL STAPHYLOCOCCUS AUREUS (A)  Final   Report Status 07/07/2021 FINAL  Final   Organism ID, Bacteria STAPHYLOCOCCUS AUREUS (A)  Final      Susceptibility   Staphylococcus aureus - MIC*    CIPROFLOXACIN <=0.5 SENSITIVE Sensitive     GENTAMICIN <=0.5 SENSITIVE Sensitive     NITROFURANTOIN <=16 SENSITIVE Sensitive     OXACILLIN 0.5 SENSITIVE Sensitive     TETRACYCLINE <=1 SENSITIVE Sensitive     VANCOMYCIN <=0.5 SENSITIVE Sensitive     TRIMETH/SULFA <=10 SENSITIVE Sensitive     CLINDAMYCIN <=0.25 SENSITIVE Sensitive     RIFAMPIN <=0.5 SENSITIVE Sensitive     Inducible Clindamycin NEGATIVE Sensitive     * >=100,000 COLONIES/mL STAPHYLOCOCCUS AUREUS  Culture, blood (Routine X 2) w Reflex to ID Panel     Status: None   Collection Time: 07/05/21 12:56 PM   Specimen: Left Antecubital; Blood  Result Value Ref Range Status   Specimen Description   Final    LEFT ANTECUBITAL Performed at [facility], [address] [place], [place] [address]    Special Requests   Final    BOTTLES DRAWN AEROBIC AND ANAEROBIC Blood Culture adequate volume Performed at [facility], [address] [place], [place] [address]    Culture   Final    NO GROWTH 5 DAYS Performed at [facility], [place] [address] [place], [place] [address]    Report Status 07/10/2021 FINAL  Final  Resp Panel by RT-PCR (Flu A&B, Covid) Nasopharyngeal Swab     Status: None   Collection Time: 07/05/21  1:03 PM   Specimen: Nasopharyngeal Swab; Nasopharyngeal(NP) swabs  in vial transport medium  Result Value Ref Range Status   SARS Coronavirus 2 by RT PCR NEGATIVE NEGATIVE Final    Comment: (NOTE) SARS-CoV-2 target nucleic acids are NOT DETECTED.  The SARS-CoV-2 RNA is generally detectable in upper respiratory specimens during the acute phase of infection. The lowest concentration of SARS-CoV-2 viral copies this assay can detect is 138 copies/mL. A negative result does not preclude SARS-Cov-2 infection and should not be used as the sole basis for treatment or other patient management decisions. A negative result may occur with  improper specimen collection/handling, submission of specimen other than nasopharyngeal swab, presence of viral mutation(s) within the areas targeted by this assay, and inadequate number of viral copies(<138 copies/mL). A negative result must be combined with clinical observations, patient history, and epidemiological information. The expected result is Negative.  Fact Sheet for Patients:  EntrepreneurPulse.com.au  Fact Sheet for Healthcare Providers:  IncredibleEmployment.be  This test is no t yet approved or cleared by the [place] FDA and  has been authorized for detection and/or diagnosis of SARS-CoV-2 by FDA under an Emergency Use Authorization (EUA). This EUA will remain  in effect (meaning this test can be used) for the duration of the COVID-19 declaration under Section 564(b)(1) of the Act, 21 U.S.C.section 360bbb-3(b)(1), unless the authorization is terminated  or revoked sooner.       Influenza A by PCR NEGATIVE NEGATIVE Final   Influenza B by PCR NEGATIVE NEGATIVE Final    Comment: (NOTE) The Xpert Xpress SARS-CoV-2/FLU/RSV  plus assay is intended as an aid in the diagnosis of influenza from Nasopharyngeal swab specimens and should not be used as a sole basis for treatment. Nasal washings and aspirates are unacceptable for Xpert Xpress  SARS-CoV-2/FLU/RSV testing.  Fact Sheet for Patients: EntrepreneurPulse.com.au  Fact Sheet for Healthcare Providers: IncredibleEmployment.be  This test is not yet approved or cleared by the [place] FDA and has been authorized for detection and/or diagnosis of SARS-CoV-2 by FDA under an Emergency Use Authorization (EUA). This EUA will remain in effect (meaning this test can be used) for the duration of the COVID-19 declaration under Section 564(b)(1) of the Act, 21 U.S.C. section 360bbb-3(b)(1), unless the authorization is terminated or revoked.  Performed at [facility], [address] [place], [place] [address]   Resp Panel by RT-PCR (Flu A&B, Covid) Nasopharyngeal Swab     Status: Abnormal   Collection Time: 07/11/21 10:20 AM   Specimen: Nasopharyngeal Swab; Nasopharyngeal(NP) swabs in vial transport medium  Result Value Ref Range Status   SARS Coronavirus 2 by RT PCR POSITIVE (A) NEGATIVE Final    Comment: (NOTE) SARS-CoV-2 target nucleic acids are DETECTED.  The SARS-CoV-2 RNA is generally detectable in upper respiratory specimens during the acute phase of infection. Positive results are indicative of the presence of the identified virus, but do not rule out bacterial infection or co-infection with other pathogens not detected by the test. Clinical correlation with patient history and other diagnostic information is necessary to determine patient infection status. The expected result is Negative.  Fact Sheet for Patients: EntrepreneurPulse.com.au  Fact Sheet for Healthcare Providers: IncredibleEmployment.be  This test is not yet approved or cleared by the [place] FDA and  has been authorized for detection and/or diagnosis of SARS-CoV-2 by FDA under an Emergency Use Authorization (EUA).  This EUA will remain in effect (meaning this test can be used) for the duration of  the  COVID-19 declaration under Section 564(b)(1) of the A ct, 21 U.S.C. section 360bbb-3(b)(1), unless the authorization is terminated or revoked sooner.     Influenza A by PCR NEGATIVE NEGATIVE Final   Influenza B by PCR NEGATIVE NEGATIVE Final    Comment: (NOTE) The Xpert Xpress SARS-CoV-2/FLU/RSV plus assay is intended as an aid in the diagnosis of influenza from Nasopharyngeal swab specimens and should not be used as a sole basis for treatment. Nasal washings and aspirates are unacceptable for Xpert Xpress SARS-CoV-2/FLU/RSV testing.  Fact Sheet for Patients: EntrepreneurPulse.com.au  Fact Sheet for Healthcare Providers: IncredibleEmployment.be  This test is not yet approved or cleared by the [place] FDA and has been authorized for detection and/or diagnosis of SARS-CoV-2 by FDA under an Emergency Use Authorization (EUA). This EUA will remain in effect (meaning this test can be used) for the duration of the COVID-19 declaration under Section 564(b)(1) of the Act, 21 U.S.C. section 360bbb-3(b)(1), unless the authorization is terminated or revoked.  Performed at [facility], [address] [place], [place] [address]   Group A Strep by PCR     Status: None   Collection Time: 07/11/21 10:20 AM   Specimen: Nasopharyngeal Swab; Sterile Swab  Result Value Ref Range Status   Group A Strep by PCR NOT DETECTED NOT DETECTED Final    Comment: Performed at [facility], [address] [place], [place] [address]  Blood culture (routine x 2)     Status: None (Preliminary result)   Collection Time: 07/11/21 10:20 AM  Specimen: BLOOD LEFT FOREARM  Result Value Ref Range Status   Specimen Description   Final    BLOOD LEFT FOREARM BLOOD Performed at [facility], [address] [place], [place] [address]    Special Requests   Final    Blood Culture adequate volume BOTTLES DRAWN AEROBIC AND ANAEROBIC Performed  at [facility], [address] [place], [place] [address]    Culture   Final    NO GROWTH 4 DAYS Performed at [facility], [place] [address] [place], [place] [address]    Report Status PENDING  Incomplete  Blood culture (routine x 2)     Status: None (Preliminary result)   Collection Time: 07/11/21 10:30 AM   Specimen: BLOOD RIGHT HAND  Result Value Ref Range Status   Specimen Description   Final    BLOOD RIGHT HAND BLOOD Performed at [facility], [address] [place], [place] [address]    Special Requests   Final    Blood Culture adequate volume BOTTLES DRAWN AEROBIC AND ANAEROBIC Performed at [facility], [address] [place], [place] [address]    Culture   Final    NO GROWTH 4 DAYS Performed at [facility], [place] [address], [place], [place] [address]    Report Status PENDING  Incomplete  Urine Culture     Status: Abnormal (Preliminary result)   Collection Time: 07/11/21  3:19 PM   Specimen: Urine, Random  Result Value Ref Range Status   Specimen Description   Final    URINE, RANDOM Performed at [facility], [address] [place], [place] [address]    Special Requests   Final    NONE Performed at [facility], [address] [place], [place] [address]    Culture (A)  Final    >=100,000 COLONIES/mL STAPHYLOCOCCUS AUREUS within mixed SUSCEPTIBILITIES TO FOLLOW Performed at [facility], [place] [address], [place], [place] [address]    Report Status PENDING  Incomplete     Labs: BNP (last 3 results) No results for input(s): BNP in the last 8760 hours. Basic Metabolic Panel: Recent Labs  Lab 07/11/21 0936 07/12/21 0240 07/13/21 0602 07/14/21 0333 07/15/21 0624  NA 130* 132* 130* 129* 130*  K 4.0 3.9 4.3 4.4 4.8  CL 96* 101 100 98 99  CO2 24 22 23 22 24   GLUCOSE 148* 121* 122* 147* 130*  BUN 20 22 26* 23 21  CREATININE 1.12 1.12 1.01 0.94 0.95  CALCIUM 8.3* 8.0* 8.1* 8.0*  8.0*  MG 1.6* 2.0 1.8 2.0 2.0  PHOS  --  3.0 2.4* 2.8 2.4*   Liver Function Tests: Recent Labs  Lab 07/11/21 0936 07/13/21 0602 07/14/21 0333  AST 35 22 19  ALT 23 21 20   ALKPHOS 33* 28* 33*  BILITOT 0.8 <0.1* 0.3  PROT 7.0 5.6* 5.7*  ALBUMIN 3.8 2.9* 2.9*   No results for input(s): LIPASE, AMYLASE in the last 168 hours. No results for input(s): AMMONIA in the last 168 hours. CBC: Recent Labs  Lab 07/11/21 0936 07/12/21 0240 07/13/21 0602 07/14/21 0333  WBC 7.5 6.1 6.8 7.5  NEUTROABS 5.5 4.2 5.0 5.5  HGB 13.2 11.9* 12.0* 11.9*  HCT 38.8* 34.6* 35.0* 33.4*  MCV 90.2 89.2 89.1 86.8  PLT 182 135* 170 170   Cardiac Enzymes: No results for input(s): CKTOTAL, CKMB, CKMBINDEX, TROPONINI in the  last 168 hours. BNP: Invalid input(s): POCBNP CBG: Recent Labs  Lab 07/14/21 0745 07/14/21 1221 07/14/21 1612 07/14/21 2031 07/15/21 0747  GLUCAP 133* 166* 119* 145* 126*   D-Dimer Recent Labs    07/13/21 0602 07/14/21 0333  DDIMER 0.93* 0.78*   Hgb A1c No results for input(s): HGBA1C in the last 72 hours. Lipid Profile No results for input(s): CHOL, HDL, LDLCALC, TRIG, CHOLHDL, LDLDIRECT in the last 72 hours. Thyroid function studies No results for input(s): TSH, T4TOTAL, T3FREE, THYROIDAB in the last 72 hours.  Invalid input(s): FREET3 Anemia work up Recent Labs    07/13/21 0602 07/14/21 0333  VITAMINB12  --  1,141*  FOLATE  --  25.5  FERRITIN 137 133  TIBC  --  252  IRON  --  30*  RETICCTPCT  --  1.0   Urinalysis    Component Value Date/Time   COLORURINE YELLOW 07/11/2021 1519   APPEARANCEUR HAZY (A) 07/11/2021 1519   LABSPEC 1.015 07/11/2021 1519   PHURINE 7.0 07/11/2021 1519   GLUCOSEU NEGATIVE 07/11/2021 1519   HGBUR SMALL (A) 07/11/2021 1519   BILIRUBINUR NEGATIVE 07/11/2021 1519   KETONESUR NEGATIVE 07/11/2021 1519   PROTEINUR NEGATIVE 07/11/2021 1519   NITRITE POSITIVE (A) 07/11/2021 1519   LEUKOCYTESUR MODERATE (A) 07/11/2021 1519    Sepsis Labs Invalid input(s): PROCALCITONIN,  WBC,  LACTICIDVEN Microbiology Recent Results (from the past 240 hour(s))  Culture, blood (Routine X 2) w Reflex to ID Panel     Status: None   Collection Time: 07/05/21 12:45 PM   Specimen: BLOOD  Result Value Ref Range Status   Specimen Description   Final    BLOOD Blood Culture results may not be optimal due to an inadequate volume of blood received in culture bottles Performed at [facility], [address] [place], [place] [address]    Special Requests   Final    AEROBIC BOTTLE ONLY BLOOD RIGHT FOREARM Performed at [facility], [address] [place], [place] [address]    Culture   Final    NO GROWTH 5 DAYS Performed at [facility], [place] [address] [place],  [address]    Report Status 07/10/2021 FINAL  Final  Urine Culture     Status: Abnormal   Collection Time: 07/05/21 12:49 PM   Specimen: Urine, Clean Catch  Result Value Ref Range Status   Specimen Description   Final    URINE, CLEAN CATCH Performed at [facility], [address] [place], [place] [address]    Special Requests   Final    NONE Performed at [facility], [address] [place], [place] [address]    Culture >=100,000 COLONIES/mL STAPHYLOCOCCUS AUREUS (A)  Final   Report Status 07/07/2021 FINAL  Final   Organism ID, Bacteria STAPHYLOCOCCUS AUREUS (A)  Final      Susceptibility   Staphylococcus aureus - MIC*    CIPROFLOXACIN <=0.5 SENSITIVE Sensitive     GENTAMICIN <=0.5 SENSITIVE Sensitive     NITROFURANTOIN <=16 SENSITIVE Sensitive     OXACILLIN 0.5 SENSITIVE Sensitive     TETRACYCLINE <=1 SENSITIVE Sensitive     VANCOMYCIN <=0.5 SENSITIVE Sensitive     TRIMETH/SULFA <=10 SENSITIVE Sensitive     CLINDAMYCIN <=0.25 SENSITIVE Sensitive     RIFAMPIN <=0.5 SENSITIVE Sensitive     Inducible Clindamycin NEGATIVE Sensitive     * >=100,000 COLONIES/mL STAPHYLOCOCCUS AUREUS  Culture, blood  (Routine  X 2) w Reflex to ID Panel     Status: None   Collection Time: 07/05/21 12:56 PM   Specimen: Left Antecubital; Blood  Result Value Ref Range Status   Specimen Description   Final    LEFT ANTECUBITAL Performed at [facility], [address] [place], [place] [address]    Special Requests   Final    BOTTLES DRAWN AEROBIC AND ANAEROBIC Blood Culture adequate volume Performed at [facility], [address] [place], [place] [address]    Culture   Final    NO GROWTH 5 DAYS Performed at [facility], [place] [address] [place], [place] [address]    Report Status 07/10/2021 FINAL  Final  Resp Panel by RT-PCR (Flu A&B, Covid) Nasopharyngeal Swab     Status: None   Collection Time: 07/05/21  1:03 PM   Specimen: Nasopharyngeal Swab; Nasopharyngeal(NP) swabs in vial transport medium  Result Value Ref Range Status   SARS Coronavirus 2 by RT PCR NEGATIVE NEGATIVE Final    Comment: (NOTE) SARS-CoV-2 target nucleic acids are NOT DETECTED.  The SARS-CoV-2 RNA is generally detectable in upper respiratory specimens during the acute phase of infection. The lowest concentration of SARS-CoV-2 viral copies this assay can detect is 138 copies/mL. A negative result does not preclude SARS-Cov-2 infection and should not be used as the sole basis for treatment or other patient management decisions. A negative result may occur with  improper specimen collection/handling, submission of specimen other than nasopharyngeal swab, presence of viral mutation(s) within the areas targeted by this assay, and inadequate number of viral copies(<138 copies/mL). A negative result must be combined with clinical observations, patient history, and epidemiological information. The expected result is Negative.  Fact Sheet for Patients:  EntrepreneurPulse.com.au  Fact Sheet for Healthcare Providers:  IncredibleEmployment.be  This test is no t yet  approved or cleared by the [place] FDA and  has been authorized for detection and/or diagnosis of SARS-CoV-2 by FDA under an Emergency Use Authorization (EUA). This EUA will remain  in effect (meaning this test can be used) for the duration of the COVID-19 declaration under Section 564(b)(1) of the Act, 21 U.S.C.section 360bbb-3(b)(1), unless the authorization is terminated  or revoked sooner.       Influenza A by PCR NEGATIVE NEGATIVE Final   Influenza B by PCR NEGATIVE NEGATIVE Final    Comment: (NOTE) The Xpert Xpress SARS-CoV-2/FLU/RSV plus assay is intended as an aid in the diagnosis of influenza from Nasopharyngeal swab specimens and should not be used as a sole basis for treatment. Nasal washings and aspirates are unacceptable for Xpert Xpress SARS-CoV-2/FLU/RSV testing.  Fact Sheet for Patients: EntrepreneurPulse.com.au  Fact Sheet for Healthcare Providers: IncredibleEmployment.be  This test is not yet approved or cleared by the [place] FDA and has been authorized for detection and/or diagnosis of SARS-CoV-2 by FDA under an Emergency Use Authorization (EUA). This EUA will remain in effect (meaning this test can be used) for the duration of the COVID-19 declaration under Section 564(b)(1) of the Act, 21 U.S.C. section 360bbb-3(b)(1), unless the authorization is terminated or revoked.  Performed at [facility], [address] [place], [place] [address]   Resp Panel by RT-PCR (Flu A&B, Covid) Nasopharyngeal Swab     Status: Abnormal   Collection Time: 07/11/21 10:20 AM   Specimen: Nasopharyngeal Swab; Nasopharyngeal(NP) swabs in vial transport medium  Result Value Ref Range Status   SARS Coronavirus 2  by RT PCR POSITIVE (A) NEGATIVE Final    Comment: (NOTE) SARS-CoV-2 target nucleic acids are DETECTED.  The SARS-CoV-2 RNA is generally detectable in upper respiratory specimens during the acute phase of  infection. Positive results are indicative of the presence of the identified virus, but do not rule out bacterial infection or co-infection with other pathogens not detected by the test. Clinical correlation with patient history and other diagnostic information is necessary to determine patient infection status. The expected result is Negative.  Fact Sheet for Patients: EntrepreneurPulse.com.au  Fact Sheet for Healthcare Providers: IncredibleEmployment.be  This test is not yet approved or cleared by the [place] FDA and  has been authorized for detection and/or diagnosis of SARS-CoV-2 by FDA under an Emergency Use Authorization (EUA).  This EUA will remain in effect (meaning this test can be used) for the duration of  the COVID-19 declaration under Section 564(b)(1) of the A ct, 21 U.S.C. section 360bbb-3(b)(1), unless the authorization is terminated or revoked sooner.     Influenza A by PCR NEGATIVE NEGATIVE Final   Influenza B by PCR NEGATIVE NEGATIVE Final    Comment: (NOTE) The Xpert Xpress SARS-CoV-2/FLU/RSV plus assay is intended as an aid in the diagnosis of influenza from Nasopharyngeal swab specimens and should not be used as a sole basis for treatment. Nasal washings and aspirates are unacceptable for Xpert Xpress SARS-CoV-2/FLU/RSV testing.  Fact Sheet for Patients: EntrepreneurPulse.com.au  Fact Sheet for Healthcare Providers: IncredibleEmployment.be  This test is not yet approved or cleared by the [place] FDA and has been authorized for detection and/or diagnosis of SARS-CoV-2 by FDA under an Emergency Use Authorization (EUA). This EUA will remain in effect (meaning this test can be used) for the duration of the COVID-19 declaration under Section 564(b)(1) of the Act, 21 U.S.C. section 360bbb-3(b)(1), unless the authorization is terminated or revoked.  Performed at [facility], [address] [place], [place] [address]   Group A Strep by PCR     Status: None   Collection Time: 07/11/21 10:20 AM   Specimen: Nasopharyngeal Swab; Sterile Swab  Result Value Ref Range Status   Group A Strep by PCR NOT DETECTED NOT DETECTED Final    Comment: Performed at [facility], [address] [place], [place] [address]  Blood culture (routine x 2)     Status: None (Preliminary result)   Collection Time: 07/11/21 10:20 AM   Specimen: BLOOD LEFT FOREARM  Result Value Ref Range Status   Specimen Description   Final    BLOOD LEFT FOREARM BLOOD Performed at [facility], [address] [place], [place] [address]    Special Requests   Final    Blood Culture adequate volume BOTTLES DRAWN AEROBIC AND ANAEROBIC Performed at [facility], [address], [place], [place] [address]    Culture   Final    NO GROWTH 4 DAYS Performed at [facility], [place] [address] [place], [place] [address]    Report Status PENDING  Incomplete  Blood culture (routine x 2)     Status: None (Preliminary result)   Collection Time: 07/11/21 10:30 AM   Specimen: BLOOD RIGHT HAND  Result Value Ref Range Status   Specimen Description   Final    BLOOD RIGHT HAND BLOOD Performed at [facility], [address], [place], [place] [address]    Special Requests   Final  Blood Culture adequate volume BOTTLES DRAWN AEROBIC AND ANAEROBIC Performed at [facility], [address] [place], [place] [address]    Culture   Final    NO GROWTH 4 DAYS Performed at [facility], [place] [address], [place], [place] [address]    Report Status PENDING  Incomplete  Urine Culture     Status: Abnormal (Preliminary result)   Collection Time: 07/11/21  3:19 PM   Specimen: Urine, Random  Result Value Ref Range Status   Specimen Description   Final    URINE, RANDOM Performed at [facility], [address] [place], [place]  [address]    Special Requests   Final    NONE Performed at [facility], [address] [place], [place] [address]    Culture (A)  Final    >=100,000 COLONIES/mL STAPHYLOCOCCUS AUREUS within mixed SUSCEPTIBILITIES TO FOLLOW Performed at [facility], [place] [address] [place], [place] [address]    Report Status PENDING  Incomplete     Time coordinating discharge: Over 30 minutes  SIGNED:   Donnamarie Poag [place] DO  [facility] 07/15/2021, 10:02 AM

## 2021-07-15 NOTE — Care Management Important Message (Signed)
Important Message  Patient Details  Name: Raymond Fowler MRN: [id number] Date of Birth: March 09, 1933   Medicare Important Message Given:  Yes     [name] 07/15/2021, 10:00 AM

## 2021-07-16 ENCOUNTER — Telehealth: Payer: Self-pay | Admitting: Cardiology

## 2021-07-16 LAB — CULTURE, BLOOD (ROUTINE X 2)
Culture: NO GROWTH
Culture: NO GROWTH
Special Requests: ADEQUATE
Special Requests: ADEQUATE

## 2021-07-16 NOTE — Telephone Encounter (Signed)
Spoke with pt's wife Mikle Bosworth an advised that 3/11 would be okay to see pt.

## 2021-07-16 NOTE — Telephone Encounter (Signed)
Patient's wife states the patient has recently in the hospital and told to follow up in 2 weeks. He is currently scheduled for 3/13 and they would like to know if he needs to be worked in sooner.

## 2021-07-18 ENCOUNTER — Emergency Department (HOSPITAL_COMMUNITY)
Admission: EM | Admit: 2021-07-18 | Discharge: 2021-07-18 | Disposition: A | Discharge status: Home / Self Care | Payer: Medicare Other | Attending: Emergency Medicine | Admitting: Emergency Medicine

## 2021-07-18 ENCOUNTER — Encounter (HOSPITAL_COMMUNITY): Payer: Self-pay

## 2021-07-18 ENCOUNTER — Other Ambulatory Visit: Payer: Self-pay

## 2021-07-18 ENCOUNTER — Emergency Department (HOSPITAL_COMMUNITY): Payer: Medicare Other

## 2021-07-18 DIAGNOSIS — S0083XA Contusion of other part of head, initial encounter: Secondary | ICD-10-CM | POA: Diagnosis not present

## 2021-07-18 DIAGNOSIS — R58 Hemorrhage, not elsewhere classified: Secondary | ICD-10-CM | POA: Diagnosis not present

## 2021-07-18 DIAGNOSIS — Z79899 Other long term (current) drug therapy: Secondary | ICD-10-CM | POA: Diagnosis not present

## 2021-07-18 DIAGNOSIS — S32422A Displaced fracture of posterior wall of left acetabulum, initial encounter for closed fracture: Secondary | ICD-10-CM | POA: Diagnosis not present

## 2021-07-18 DIAGNOSIS — E871 Hypo-osmolality and hyponatremia: Secondary | ICD-10-CM | POA: Diagnosis not present

## 2021-07-18 DIAGNOSIS — E878 Other disorders of electrolyte and fluid balance, not elsewhere classified: Secondary | ICD-10-CM | POA: Insufficient documentation

## 2021-07-18 DIAGNOSIS — S02832A Fracture of medial orbital wall, left side, initial encounter for closed fracture: Secondary | ICD-10-CM | POA: Insufficient documentation

## 2021-07-18 DIAGNOSIS — S0292XA Unspecified fracture of facial bones, initial encounter for closed fracture: Secondary | ICD-10-CM

## 2021-07-18 DIAGNOSIS — S0240FA Zygomatic fracture, left side, initial encounter for closed fracture: Secondary | ICD-10-CM | POA: Insufficient documentation

## 2021-07-18 DIAGNOSIS — S60812A Abrasion of left wrist, initial encounter: Secondary | ICD-10-CM | POA: Insufficient documentation

## 2021-07-18 DIAGNOSIS — Y92410 Unspecified street and highway as the place of occurrence of the external cause: Secondary | ICD-10-CM | POA: Insufficient documentation

## 2021-07-18 DIAGNOSIS — S61412A Laceration without foreign body of left hand, initial encounter: Secondary | ICD-10-CM | POA: Diagnosis not present

## 2021-07-18 DIAGNOSIS — Z7982 Long term (current) use of aspirin: Secondary | ICD-10-CM | POA: Diagnosis not present

## 2021-07-18 DIAGNOSIS — S0232XA Fracture of orbital floor, left side, initial encounter for closed fracture: Secondary | ICD-10-CM | POA: Diagnosis not present

## 2021-07-18 DIAGNOSIS — Z8616 Personal history of COVID-19: Secondary | ICD-10-CM | POA: Insufficient documentation

## 2021-07-18 DIAGNOSIS — I10 Essential (primary) hypertension: Secondary | ICD-10-CM | POA: Diagnosis not present

## 2021-07-18 DIAGNOSIS — S80212A Abrasion, left knee, initial encounter: Secondary | ICD-10-CM | POA: Diagnosis not present

## 2021-07-18 DIAGNOSIS — S0240DA Maxillary fracture, left side, initial encounter for closed fracture: Secondary | ICD-10-CM | POA: Insufficient documentation

## 2021-07-18 DIAGNOSIS — S0993XA Unspecified injury of face, initial encounter: Secondary | ICD-10-CM | POA: Diagnosis present

## 2021-07-18 DIAGNOSIS — S60811A Abrasion of right wrist, initial encounter: Secondary | ICD-10-CM | POA: Diagnosis not present

## 2021-07-18 DIAGNOSIS — S61411A Laceration without foreign body of right hand, initial encounter: Secondary | ICD-10-CM | POA: Insufficient documentation

## 2021-07-18 DIAGNOSIS — S80211A Abrasion, right knee, initial encounter: Secondary | ICD-10-CM | POA: Insufficient documentation

## 2021-07-18 DIAGNOSIS — W19XXXA Unspecified fall, initial encounter: Secondary | ICD-10-CM | POA: Diagnosis not present

## 2021-07-18 DIAGNOSIS — S0285XA Fracture of orbit, unspecified, initial encounter for closed fracture: Secondary | ICD-10-CM

## 2021-07-18 DIAGNOSIS — R739 Hyperglycemia, unspecified: Secondary | ICD-10-CM | POA: Diagnosis not present

## 2021-07-18 DIAGNOSIS — T68XXXA Hypothermia, initial encounter: Secondary | ICD-10-CM | POA: Diagnosis not present

## 2021-07-18 DIAGNOSIS — R4020 Unspecified coma: Secondary | ICD-10-CM | POA: Diagnosis not present

## 2021-07-18 LAB — CBC
HCT: 35.9 % — ABNORMAL LOW (ref 39.0–52.0)
Hemoglobin: 11.9 g/dL — ABNORMAL LOW (ref 13.0–17.0)
MCH: 29.8 pg (ref 26.0–34.0)
MCHC: 33.1 g/dL (ref 30.0–36.0)
MCV: 89.8 fL (ref 80.0–100.0)
Platelets: 290 10*3/uL (ref 150–400)
RBC: 4 MIL/uL — ABNORMAL LOW (ref 4.22–5.81)
RDW: 13.2 % (ref 11.5–15.5)
WBC: 9.7 10*3/uL (ref 4.0–10.5)
nRBC: 0 % (ref 0.0–0.2)

## 2021-07-18 LAB — BASIC METABOLIC PANEL
Anion gap: 8 (ref 5–15)
BUN: 24 mg/dL — ABNORMAL HIGH (ref 8–23)
CO2: 23 mmol/L (ref 22–32)
Calcium: 8.1 mg/dL — ABNORMAL LOW (ref 8.9–10.3)
Chloride: 95 mmol/L — ABNORMAL LOW (ref 98–111)
Creatinine, Ser: 1.06 mg/dL (ref 0.61–1.24)
GFR, Estimated: >60 mL/min (ref >60)
Glucose, Bld: 219 mg/dL — ABNORMAL HIGH (ref 70–99)
Potassium: 4.3 mmol/L (ref 3.5–5.1)
Sodium: 126 mmol/L — ABNORMAL LOW (ref 135–145)

## 2021-07-18 LAB — TROPONIN I (HIGH SENSITIVITY)
Troponin I (High Sensitivity): 10 ng/L (ref <18)
Troponin I (High Sensitivity): 15 ng/L (ref <18)

## 2021-07-18 LAB — MAGNESIUM: Magnesium: 1.7 mg/dL (ref 1.7–2.4)

## 2021-07-18 MED ORDER — AMOXICILLIN-POT CLAVULANATE 875-125 MG PO TABS: 1.0000 | ORAL_TABLET | Freq: Two times a day (BID) | ORAL | 0 refills | Status: AC

## 2021-07-18 NOTE — ED Triage Notes (Signed)
Pt arrived to ED via EMS from home after a fall w/ unknown cause. Pt was found by his neighbors crawling from the road to the grass. Pt had LOC and doesn't remember why he fell or how he fell. Pt is very HOH and does not have his hearing aids w/ him. Pt mentally at baseline per pt's wife. Pt A&Ox4. EMS reports small pool of blood approximately 186mL in road from pt. Pt w/ multiple injuries to L face including hematomas and lacerations. L elbow skin tear and bilateral hand skin tears. Pt ambulatory w/ cane and ambulated well w/ EMS. EMS placed 16g R AC. VS: 158/96, HR 82, CBG 288

## 2021-07-18 NOTE — ED Provider Notes (Signed)
[facility] EMERGENCY DEPARTMENT Provider Note   CSN: [id number] Arrival date & time: 07/18/21  0093     History  Chief Complaint  Patient presents with   Fall   Trauma    Raymond Fowler is a 88 y.o. male presenting from home with a fall and head injury.  The patient says he does not recall what happened today.  He says he has no memory.  EMS reports that the neighbors found by his neighbors crawling alongside the road to the grass.  His wife said that there was a overturned crate there and wonders whether he may have tripped on that.  The patient comes in with abrasions to the bilateral wrists and also over the left forehead.  He denies a headache.  He is not on blood thinners.  Per my review of the medical records, he was recently hospitalized with COVID diagnosis and discharged 3 days ago, on February 16.  At that time he had episode of SVT was evaluated by cardiology, started on amiodarone drip and discharged with amiodarone home.  Covid POSITIVE on 07/11/21  His last echocardiogram which was performed a week ago showed left ventricular EF reduced at 30 to 81%, grade 1 diastolic dysfunction.  HPI     Home Medications Prior to Admission medications   Medication Sig Start Date End Date Taking? Authorizing Provider  amoxicillin-clavulanate (AUGMENTIN) 875-125 MG tablet Take 1 tablet by mouth every 12 (twelve) hours for 7 days. 07/18/21 07/25/21 Yes Adriell Polansky, Carola Rhine, MD  alendronate (FOSAMAX) 70 MG tablet Take 70 mg by mouth once a week. 06/16/21   [provider]  amiodarone (PACERONE) 200 MG tablet Take 2 tablets (400 mg total) by mouth 2 (two) times daily for 5 days, THEN 2 tablets (400 mg total) daily. 07/15/21 10/18/21  [place] Eric J, DO  Ascorbic Acid (VITAMIN C) 1000 MG tablet Take 1,000 mg by mouth daily.    [provider]  aspirin EC 325 MG tablet Take 325 mg by mouth daily.    [provider]  atorvastatin (LIPITOR) 20 MG tablet  Take 20 mg by mouth every evening. 07/18/15   [provider]  brimonidine (ALPHAGAN) 0.2 % ophthalmic solution Place 1 drop into both eyes 2 (two) times daily. 05/29/15   [provider]  carboxymethylcellul-glycerin (REFRESH OPTIVE) 0.5-0.9 % ophthalmic solution Place 1 drop into both eyes 2 (two) times daily.    [provider]  cycloSPORINE (RESTASIS) 0.05 % ophthalmic emulsion Place 1 drop into both eyes 2 (two) times daily.    [provider]  finasteride (PROSCAR) 5 MG tablet Take 5 mg by mouth daily. 04/19/21   [provider]  meclizine (ANTIVERT) 25 MG tablet Take 1 tablet (25 mg total) by mouth 3 (three) times daily as needed for dizziness. 10/31/16   Pattricia Boss, MD  metoprolol succinate (TOPROL-XL) 50 MG 24 hr tablet Take 1 tablet (50 mg total) by mouth daily. Take with or immediately following a meal. 07/16/21 10/14/21  [place] Donnamarie Poag, DO  Multiple Vitamins-Minerals (MULTIVITAMIN WITH MINERALS) tablet Take 1 tablet by mouth daily.    [provider]  Omega-3 Fatty Acids (FISH OIL) 1200 MG CAPS Take 1,200 mg by mouth daily.    [provider]  ondansetron (ZOFRAN-ODT) 4 MG disintegrating tablet Take 1 tablet (4 mg total) by mouth every 8 (eight) hours as needed for nausea or vomiting. 07/05/21   Fredia Sorrow, MD  sacubitril-valsartan (ENTRESTO) 24-26 MG Take 1  tablet by mouth 2 (two) times daily. 07/15/21 10/13/21  [place] Eric J, DO  silodosin (RAPAFLO) 8 MG CAPS capsule Take 8 mg by mouth daily. 04/19/21   [provider]      Allergies    Patient has no known allergies.    Review of Systems   Review of Systems  Physical Exam Updated Vital Signs BP (!) 166/91    Pulse 61    Temp 98.1 F (36.7 C) (Oral)    Resp 14    Ht 5\' 10"  (1.778 m)    Wt 54.3 kg    SpO2 98%    BMI 17.18 kg/m  Physical Exam Constitutional:      General: He is not in acute distress. HENT:     Head: Normocephalic.     Comments: Left  forehead hematoma, superficial abrasions and road rash on the left side of the face, left periorbital ecchymoses patient is here with a fall, may have been mechanical versus syncope versus near syncope.  From a traumatic standpoint I have ordered is unclear whether this was a syncopal episode.  Therefore I have ordered blood test and EKG. Eyes:     Conjunctiva/sclera: Conjunctivae normal.     Pupils: Pupils are equal, round, and reactive to light.  Cardiovascular:     Rate and Rhythm: Normal rate and regular rhythm.  Pulmonary:     Effort: Pulmonary effort is normal. No respiratory distress.  Musculoskeletal:     Comments: Skin tears to the dorsum of the right hand, smaller skin tear to dorsum of the left hand, superficial abrasion to the bilateral patellas.  No focal bony tenderness, full range of motion of all the joints including the hips and the wrist, no anatomic snuffbox tenderness.  Skin:    General: Skin is warm and dry.  Neurological:     General: No focal deficit present.     Mental Status: He is alert. Mental status is at baseline.  Psychiatric:        Mood and Affect: Mood normal.        Behavior: Behavior normal.    ED Results / Procedures / Treatments   Labs (all labs ordered are listed, but only abnormal results are displayed) Labs Reviewed  BASIC METABOLIC PANEL - Abnormal; Notable for the following components:      Result Value   Sodium 126 (*)    Chloride 95 (*)    Glucose, Bld 219 (*)    BUN 24 (*)    Calcium 8.1 (*)    All other components within normal limits  CBC - Abnormal; Notable for the following components:   RBC 4.00 (*)    Hemoglobin 11.9 (*)    HCT 35.9 (*)    All other components within normal limits  MAGNESIUM  TROPONIN I (HIGH SENSITIVITY)  TROPONIN I (HIGH SENSITIVITY)    EKG EKG Interpretation  Date/Time:  Sunday July 18 2021 18:51:13 EST Ventricular Rate:  67 PR Interval:  214 QRS Duration: 156 QT Interval:  467 QTC  Calculation: 493 R Axis:   70 Text Interpretation: Sinus rhythm Borderline prolonged PR interval Probable left ventricular hypertrophy Anterior Q waves, possibly due to LVH Confirmed by Octaviano Glow [phone] on 07/18/2021 7:16:25 PM  Radiology CT HEAD WO CONTRAST (5MM)  Addendum Date: 07/18/2021   ADDENDUM REPORT: 07/18/2021 20:13 ADDENDUM: Upon further evaluation, mild to moderate severity preseptal soft tissue swelling is seen on the left. An acute fracture of the lateral wall of the  left orbit is seen with a mild amount of adjacent orbital emphysema. Electronically Signed   By: Virgina Norfolk M.D.   On: 07/18/2021 20:13   Result Date: 07/18/2021 CLINICAL DATA:  Status post trauma. EXAM: CT HEAD WITHOUT CONTRAST TECHNIQUE: Contiguous axial images were obtained from the base of the skull through the vertex without intravenous contrast. RADIATION DOSE REDUCTION: This exam was performed according to the departmental dose-optimization program which includes automated exposure control, adjustment of the mA and/or kV according to patient size and/or use of iterative reconstruction technique. COMPARISON:  February 19, 2021 FINDINGS: Brain: No evidence of acute infarction, hemorrhage, hydrocephalus, extra-axial collection or mass lesion/mass effect. Vascular: No hyperdense vessel or unexpected calcification. Skull: There is a nondisplaced fracture of the left zygomatic arch. Sinuses/Orbits: Marked severity left maxillary sinus, left ethmoid sinus and left nasal mucosal thickening is seen. A hemorrhagic component is noted. Acute, displaced fracture deformities are seen involving the anterior, medial and posterior walls of the left maxillary sinus. An additional fracture of the floor of the left orbit is seen. A left-sided cochlear implant is noted with associated streak artifact. Other: There is moderate severity left-sided facial, left periorbital and left frontal scalp soft tissue swelling. IMPRESSION: 1.  No acute intracranial abnormality. 2. Acute, displaced fracture deformities of the anterior, medial and posterior walls of the left maxillary sinus with an additional fracture of the floor of the left orbit. 3. Marked severity left-sided facial, left periorbital and left frontal scalp soft tissue swelling. 4. Nondisplaced fracture of the left zygomatic arch. 5. Marked severity left maxillary sinus, left ethmoid sinus and left nasal mucosal thickening with a hemorrhagic component. 6. Left-sided cochlear implant. Electronically Signed: By: Virgina Norfolk M.D. On: 07/18/2021 19:56    Procedures Procedures    Medications Ordered in ED Medications - No data to display  ED Course/ Medical Decision Making/ A&P Clinical Course as of 07/18/21 2310  Sun Jul 18, 2021  2001   IMPRESSION: 1. No acute intracranial abnormality. 2. Acute, displaced fracture deformities of the anterior, medial and posterior walls of the left maxillary sinus with an additional fracture of the floor of the left orbit. 3. Marked severity left-sided facial, left periorbital and left frontal scalp soft tissue swelling. 4. Nondisplaced fracture of the left zygomatic arch. 5. Marked severity left maxillary sinus, left ethmoid sinus and left nasal mucosal thickening with a hemorrhagic component. 6. Left-sided cochlear implant. [MT]  2023 Left voicemail with Dr Mancel Parsons facial trauma [MT]  2031 Dr Mancel Parsons oral trauma called back, recommends augmentin ppx for 7 days, office f/u this week, and nasal/nose blowing precautions.  These were discussed with patient and his wife who agree.  Also discussed mild hyponatremia; can have pcp f/u on this [MT]    Clinical Course User Index [MT] Kaoir Loree, Carola Rhine, MD                           Medical Decision Making Amount and/or Complexity of Data Reviewed Labs: ordered. Radiology: ordered.  Risk Prescription drug management.   Pt here with fall, syncope vs near syncope vs other From  traumatic standpoint, I've ordered CTH and personally reviewed the images, showing multiple nondisplaced fractures, maxilla left side, periorbital, zygomatic.  No trismus or difficulty opening his jaw or swallowing.  No evidence of orbital entrapment on exam.  Doubt fx of the pelvis, hips, wrist, or lower extremities - no bony ttp or pain   Wounds irrigated  and cleaned - too superficial for sutures.  Tetanus up to date via PCP per wife's report.  Unclear if syncope - ordered ECG and blood tests.  He is asymptomatic and BP mildly high here.  ECG per my interpretation shows NSR without acute ischemic findings.  Labs per my interpretation show mild hyponatremia and hypochloremia, otherwise unremarkable.  Hemoglobin at baseline.  Troponin 15.  Doubt ACS, PE.  I suspect his falls likely related to mechanical injury or trip.  Supplemental hx provided by EMS and patient's wife        Final Clinical Impression(s) / ED Diagnoses Final diagnoses:  Closed fracture of facial bone, unspecified facial bone, initial encounter ([place])  Fracture of left orbital wall ([place])  Fall, initial encounter    Rx / DC Orders ED Discharge Orders          Ordered    amoxicillin-clavulanate (AUGMENTIN) 875-125 MG tablet  Every 12 hours        07/18/21 2222              Wyvonnia Dusky, MD 07/18/21 2310

## 2021-07-18 NOTE — ED Notes (Signed)
Pt's wounds have been cleaned and dressed per EDP recommendations. Pt's leg bag emptied

## 2021-07-18 NOTE — Discharge Instructions (Addendum)
Do not blow your nose for 2 weeks Do not drink out of straws  Take Augmentin for 7 days as prescribed  Please call to follow up with Dr Mancel Parsons ENT

## 2021-07-19 ENCOUNTER — Ambulatory Visit (HOSPITAL_BASED_OUTPATIENT_CLINIC_OR_DEPARTMENT_OTHER): Payer: Medicare Other

## 2021-07-20 DIAGNOSIS — S0292XD Unspecified fracture of facial bones, subsequent encounter for fracture with routine healing: Secondary | ICD-10-CM | POA: Diagnosis not present

## 2021-07-20 DIAGNOSIS — D692 Other nonthrombocytopenic purpura: Secondary | ICD-10-CM | POA: Diagnosis not present

## 2021-07-20 DIAGNOSIS — T148XXA Other injury of unspecified body region, initial encounter: Secondary | ICD-10-CM | POA: Diagnosis not present

## 2021-07-20 DIAGNOSIS — E871 Hypo-osmolality and hyponatremia: Secondary | ICD-10-CM | POA: Diagnosis not present

## 2021-07-20 DIAGNOSIS — W19XXXD Unspecified fall, subsequent encounter: Secondary | ICD-10-CM | POA: Diagnosis not present

## 2021-07-20 DIAGNOSIS — Z9181 History of falling: Secondary | ICD-10-CM | POA: Diagnosis not present

## 2021-07-20 DIAGNOSIS — Z8679 Personal history of other diseases of the circulatory system: Secondary | ICD-10-CM | POA: Diagnosis not present

## 2021-07-20 DIAGNOSIS — E038 Other specified hypothyroidism: Secondary | ICD-10-CM | POA: Diagnosis not present

## 2021-07-22 DIAGNOSIS — N319 Neuromuscular dysfunction of bladder, unspecified: Secondary | ICD-10-CM | POA: Diagnosis not present

## 2021-07-22 DIAGNOSIS — R338 Other retention of urine: Secondary | ICD-10-CM | POA: Diagnosis not present

## 2021-08-05 DIAGNOSIS — I10 Essential (primary) hypertension: Secondary | ICD-10-CM | POA: Insufficient documentation

## 2021-08-09 ENCOUNTER — Other Ambulatory Visit: Payer: Self-pay

## 2021-08-09 ENCOUNTER — Other Ambulatory Visit (HOSPITAL_BASED_OUTPATIENT_CLINIC_OR_DEPARTMENT_OTHER): Payer: Self-pay

## 2021-08-09 ENCOUNTER — Encounter: Payer: Self-pay | Admitting: Cardiology

## 2021-08-09 ENCOUNTER — Ambulatory Visit (INDEPENDENT_AMBULATORY_CARE_PROVIDER_SITE_OTHER): Payer: Medicare Other | Admitting: Cardiology

## 2021-08-09 VITALS — BP 90/42 | HR 65 | Ht 70.0 in | Wt 121.0 lb

## 2021-08-09 DIAGNOSIS — I10 Essential (primary) hypertension: Secondary | ICD-10-CM | POA: Diagnosis not present

## 2021-08-09 DIAGNOSIS — E119 Type 2 diabetes mellitus without complications: Secondary | ICD-10-CM

## 2021-08-09 DIAGNOSIS — I420 Dilated cardiomyopathy: Secondary | ICD-10-CM

## 2021-08-09 DIAGNOSIS — E1159 Type 2 diabetes mellitus with other circulatory complications: Secondary | ICD-10-CM | POA: Diagnosis not present

## 2021-08-09 DIAGNOSIS — I471 Supraventricular tachycardia, unspecified: Secondary | ICD-10-CM

## 2021-08-09 DIAGNOSIS — E875 Hyperkalemia: Secondary | ICD-10-CM

## 2021-08-09 MED ORDER — VALSARTAN 40 MG PO TABS
40.0000 mg | ORAL_TABLET | Freq: Every day | ORAL | 0 refills | Status: DC
  Filled 2021-08-09: qty 30, 30d supply, fill #0

## 2021-08-09 MED ORDER — METOPROLOL SUCCINATE ER 25 MG PO TB24: 25.0000 mg | ORAL_TABLET | Freq: Every day | ORAL | 2 refills | Status: DC

## 2021-08-09 NOTE — Progress Notes (Signed)
?Cardiology Office Note:   ? ?Date:  08/09/2021  ? ?ID:  Raymond Fowler, DOB April 05, 1933, MRN [id number] ? ?PCP:  Raymond Melter, MD  ?Cardiologist:  Raymond Lindau, MD  ? ?Referring MD: Raymond Melter, MD  ? ? ?ASSESSMENT:   ? ?1. SVT (supraventricular tachycardia) ([place])   ?2. Type 2 diabetes mellitus with other circulatory complications ([place])   ?3. Essential hypertension   ?4. Dilated cardiomyopathy ([place])   ?5. Diabetes mellitus without complication ([place])   ? ?PLAN:   ? ?In order of problems listed above: ? ?Primary prevention stressed with the patient.  Importance of compliance with diet medication stressed any vocalized understanding. ?Paroxysmal SVT: Patient is on amiodarone therapy and I cut down the dose to 200 mg daily.  He will have blood work today including Chem-7 and liver panel. ?Cardiomyopathy: On guideline directed medical therapy however patient has significant issues with hypotension especially orthostatic hypotension and in my opinion the risks clearly outweigh the benefits of that therapy.  In that situation I have discontinued Entresto and will initiate him on low-dose valsartan beginning tomorrow.  He will also cut down his metoprolol to half dose.  Amiodarone will be reduced to 200 mg daily.  Benefits and potential risks of amiodarone explained and he vocalized understanding.  Questions were answered to his satisfaction. ?Mixed dyslipidemia: Diet emphasized.  Lifestyle modification urged and he understands. ?Patient will be seen in follow-up appointment in 2 to 3 weeks or earlier if the patient has any concerns.  I reviewed hospital records extensively.  Questions were answered to their satisfaction. ? ? ?Medication Adjustments/Labs and Tests Ordered: ?Current medicines are reviewed at length with the patient today.  Concerns regarding medicines are outlined above.  ?No orders of the defined types were placed in this encounter. ? ?No orders of the defined types were placed in this  encounter. ? ? ? ?No chief complaint on file. ?  ? ?History of Present Illness:   ? ?Raymond Fowler is a 88 y.o. male.  Patient has past medical history of diabetes mellitus.  He was recently admitted to the hospital.  He had SVT which was resolved.  Subsequently was initiated on amiodarone.  He also has cardiomyopathy and therefore he was initiated on Entresto and beta-blocker therapy.  Patient's wife mentions to me that whenever he tries to sit up or stand up he feels very dizzy and lightheaded and is concerned about it.  No chest pain orthopnea or PND patient is comfortable but appearing very frail.  At the time of my evaluation, the patient is alert awake oriented and in no distress. ? ?Past Medical History:  ?Diagnosis Date  ? Bilateral hearing loss 07/29/2015  ? Bilateral impacted cerumen 07/29/2015  ? BPH (benign prostatic hyperplasia) 07/12/2021  ? Cardiac murmur 07/09/2021  ? Cerebrovascular disease 11/04/2016  ? Cochlear implant in place 02/24/2020  ? COVID-19 virus infection 07/12/2021  ? Diabetes mellitus without complication ([place])   ? Diet-controlled diabetes mellitus ([place]) 07/09/2021  ? Dilated cardiomyopathy ([place]) 07/14/2021  ? Dizziness and giddiness 11/04/2016  ? Essential hypertension 07/06/2021  ? Frailty 07/06/2021  ? Hardening of the aorta (main artery of the heart) ([place]) 07/06/2021  ? Hypertension   ? Hypomagnesemia 07/12/2021  ? Hyponatremia 07/12/2021  ? Hypophosphatemia 07/13/2021  ? Normocytic anemia 07/12/2021  ? Osteoporosis 07/06/2021  ? Palpitations 07/09/2021  ? Pathological fracture of vertebra 07/06/2021  ? Protein calorie malnutrition ([place]) 07/06/2021  ? Protein-calorie malnutrition, severe 07/12/2021  ? Pure hypercholesterolemia 07/06/2021  ?  Sensorineural hearing loss (SNHL) of both ears 02/27/2018  ? Subclinical hypothyroidism 07/13/2021  ? Supraventricular tachycardia ([place]) 07/09/2021  ? SVT (supraventricular tachycardia) ([place]) 07/11/2021  ? Thrombocytopenia ([place]) 07/12/2021  ? Type 2 diabetes mellitus with other  circulatory complications ([place]) 08/05/7562  ? Wears hearing aid in right ear 03/10/2020  ? ? ?Past Surgical History:  ?Procedure Laterality Date  ? APPENDECTOMY    ? CHOLECYSTECTOMY    ? COCHLEAR IMPLANT    ? EYE SURGERY    ? FRACTURE SURGERY    ? HERNIA REPAIR    ? TONSILLECTOMY    ? ? ?Current Medications: ?Current Meds  ?Medication Sig  ? alendronate (FOSAMAX) 70 MG tablet Take 70 mg by mouth once a week.  ? amiodarone (PACERONE) 200 MG tablet Take 200 mg by mouth 2 (two) times daily.  ? Ascorbic Acid (VITAMIN C) 1000 MG tablet Take 1,000 mg by mouth daily.  ? aspirin EC 325 MG tablet Take 325 mg by mouth daily.  ? atorvastatin (LIPITOR) 20 MG tablet Take 20 mg by mouth every evening.  ? brimonidine (ALPHAGAN) 0.2 % ophthalmic solution Place 1 drop into both eyes 2 (two) times daily.  ? carboxymethylcellul-glycerin (REFRESH OPTIVE) 0.5-0.9 % ophthalmic solution Place 1 drop into both eyes 2 (two) times daily.  ? cycloSPORINE (RESTASIS) 0.05 % ophthalmic emulsion Place 1 drop into both eyes 2 (two) times daily.  ? finasteride (PROSCAR) 5 MG tablet Take 5 mg by mouth daily.  ? metoprolol succinate (TOPROL-XL) 50 MG 24 hr tablet Take 1 tablet (50 mg total) by mouth daily. Take with or immediately following a meal.  ? Multiple Vitamins-Minerals (MULTIVITAMIN WITH MINERALS) tablet Take 1 tablet by mouth daily.  ? Omega-3 Fatty Acids (FISH OIL) 1200 MG CAPS Take 1,200 mg by mouth daily.  ? ondansetron (ZOFRAN-ODT) 4 MG disintegrating tablet Take 1 tablet (4 mg total) by mouth every 8 (eight) hours as needed for nausea or vomiting.  ? silodosin (RAPAFLO) 8 MG CAPS capsule Take 8 mg by mouth daily.  ? [DISCONTINUED] sacubitril-valsartan (ENTRESTO) 24-26 MG Take 1 tablet by mouth 2 (two) times daily.  ?  ? ?Allergies:   Patient has no known allergies.  ? ?Social History  ? ?Socioeconomic History  ? Marital status: Married  ?  Spouse name: Raymond Fowler  ? Number of children: 2  ? Years of education: Not on file  ? Highest  education level: Not on file  ?Occupational History  ? Occupation: Retired  ?Tobacco Use  ? Smoking status: Never  ? Smokeless tobacco: Never  ?Vaping Use  ? Vaping Use: Never used  ?Substance and Sexual Activity  ? Alcohol use: Yes  ?  Comment: rare  ? Drug use: No  ? Sexual activity: Not Currently  ?Other Topics Concern  ? Not on file  ?Social History Narrative  ? Lives with wife  ? Caffeine use: 4 cups daily  ? Right handed  ? ?Social Determinants of Health  ? ?Financial Resource Strain: Not on file  ?Food Insecurity: Not on file  ?Transportation Needs: Not on file  ?Physical Activity: Not on file  ?Stress: Not on file  ?Social Connections: Not on file  ?  ? ?Family History: ?The patient's family history includes Diabetes in his sister. ? ?ROS:   ?Please see the history of present illness.    ?All other systems reviewed and are negative. ? ?EKGs/Labs/Other Studies Reviewed:   ? ?The following studies were reviewed today: ?I discussed my findings with  the patient at length. ? ? ?Recent Labs: ?07/12/2021: TSH 10.609 ?07/14/2021: ALT 20 ?07/18/2021: BUN 24; Creatinine, Ser 1.06; Hemoglobin 11.9; Magnesium 1.7; Platelets 290; Potassium 4.3; Sodium 126  ?Recent Lipid Panel ?   ?Component Value Date/Time  ? CHOL 109 07/12/2021 0240  ? TRIG 39 07/12/2021 0240  ? HDL 53 07/12/2021 0240  ? CHOLHDL 2.1 07/12/2021 0240  ? VLDL 8 07/12/2021 0240  ? [place] 48 07/12/2021 0240  ? ? ?Physical Exam:   ? ?VS:  BP (!) 90/42   Pulse 65   Ht '5\' 10"'$  (1.778 m)   Wt 121 lb (54.9 kg)   BMI 17.36 kg/m?    ? ?Wt Readings from Last 3 Encounters:  ?08/09/21 121 lb (54.9 kg)  ?07/18/21 119 lb 11.4 oz (54.3 kg)  ?07/15/21 119 lb 11.4 oz (54.3 kg)  ?  ? ?GEN: Patient is in no acute distress ?HEENT: Normal ?NECK: No JVD; No carotid bruits ?LYMPHATICS: No lymphadenopathy ?CARDIAC: Hear sounds regular, 2/6 systolic murmur at the apex. ?RESPIRATORY:  Clear to auscultation without rales, wheezing or rhonchi  ?ABDOMEN: Soft, non-tender,  non-distended ?MUSCULOSKELETAL:  No edema; No deformity  ?SKIN: Warm and dry ?NEUROLOGIC:  Alert and oriented x 3 ?PSYCHIATRIC:  Normal affect  ? ?Signed, ?Raymond Lindau, MD  ?08/09/2021 2:36 PM    ?[place]  ?

## 2021-08-09 NOTE — Patient Instructions (Signed)
Drink Gatorade Zero today. ? ?Medication Instructions:  ?Your physician has recommended you make the following change in your medication:  ? ?Stop Entresto Nutritional therapist) ? ?Start Valsartan 40 mg twice daily on Wednesday. ? ?Decrease your Metoprolol succinate to 25 mg daily. Take 1/2 tablet of your current dose. ? ?*If you need a refill on your cardiac medications before your next appointment, please call your pharmacy* ? ? ?Lab Work: ?Your physician recommends that you have labs done in the office today. Your test included  basic metabolic panel and liver function. ? ?If you have labs (blood work) drawn today and your tests are completely normal, you will receive your results only by: ?MyChart Message (if you have MyChart) OR ?A paper copy in the mail ?If you have any lab test that is abnormal or we need to change your treatment, we will call you to review the results. ? ? ?Testing/Procedures: ?None ordered ? ? ?Follow-Up: ?At [facility], you and your health needs are our priority.  As part of our continuing mission to provide you with exceptional heart care, we have created designated Provider Care Teams.  These Care Teams include your primary Cardiologist (physician) and Advanced Practice Providers (APPs -  Physician Assistants and Nurse Practitioners) who all work together to provide you with the care you need, when you need it. ? ?We recommend signing up for the patient portal called "MyChart".  Sign up information is provided on this After Visit Summary.  MyChart is used to connect with patients for Virtual Visits (Telemedicine).  Patients are able to view lab/test results, encounter notes, upcoming appointments, etc.  Non-urgent messages can be sent to your provider as well.   ?To learn more about what you can do with MyChart, go to [email or web address].   ? ?Your next appointment:   ?3 weeks ? ?The format for your next appointment:   ?In Person ? ?Provider:   ?Jyl Heinz, MD ? ? ?Other  Instructions ?NA    ?

## 2021-08-10 ENCOUNTER — Other Ambulatory Visit (HOSPITAL_BASED_OUTPATIENT_CLINIC_OR_DEPARTMENT_OTHER): Payer: Self-pay

## 2021-08-10 LAB — BASIC METABOLIC PANEL
BUN/Creatinine Ratio: 16 (ref 10–24)
BUN: 21 mg/dL (ref 8–27)
CO2: 26 mmol/L (ref 20–29)
Calcium: 8.8 mg/dL (ref 8.6–10.2)
Chloride: 101 mmol/L (ref 96–106)
Creatinine, Ser: 1.28 mg/dL — ABNORMAL HIGH (ref 0.76–1.27)
Glucose: 163 mg/dL — ABNORMAL HIGH (ref 70–99)
Potassium: 5.9 mmol/L — ABNORMAL HIGH (ref 3.5–5.2)
Sodium: 139 mmol/L (ref 134–144)
eGFR: 54 mL/min/{1.73_m2} — ABNORMAL LOW (ref >59)

## 2021-08-10 LAB — HEPATIC FUNCTION PANEL
ALT: 15 IU/L (ref 0–44)
AST: 13 IU/L (ref 0–40)
Albumin: 3.8 g/dL (ref 3.6–4.6)
Alkaline Phosphatase: 50 IU/L (ref 44–121)
Bilirubin Total: 0.3 mg/dL (ref 0.0–1.2)
Bilirubin, Direct: 0.11 mg/dL (ref 0.00–0.40)
Total Protein: 6.2 g/dL (ref 6.0–8.5)

## 2021-08-10 MED ORDER — SODIUM POLYSTYRENE SULFONATE 15 GM/60ML PO SUSP
Freq: Once | ORAL | 0 refills | Status: AC
  Filled 2021-08-10: qty 120, 1d supply, fill #0

## 2021-08-10 NOTE — Addendum Note (Signed)
Addended by: Truddie Hidden on: 08/10/2021 04:44 PM ? ? Modules accepted: Orders ? ?

## 2021-08-11 ENCOUNTER — Other Ambulatory Visit (HOSPITAL_BASED_OUTPATIENT_CLINIC_OR_DEPARTMENT_OTHER): Payer: Self-pay

## 2021-08-11 DIAGNOSIS — I10 Essential (primary) hypertension: Secondary | ICD-10-CM | POA: Diagnosis not present

## 2021-08-11 DIAGNOSIS — I700 Atherosclerosis of aorta: Secondary | ICD-10-CM | POA: Diagnosis not present

## 2021-08-11 DIAGNOSIS — I420 Dilated cardiomyopathy: Secondary | ICD-10-CM | POA: Diagnosis not present

## 2021-08-11 DIAGNOSIS — R54 Age-related physical debility: Secondary | ICD-10-CM | POA: Diagnosis not present

## 2021-08-11 DIAGNOSIS — E46 Unspecified protein-calorie malnutrition: Secondary | ICD-10-CM | POA: Diagnosis not present

## 2021-08-11 DIAGNOSIS — E1159 Type 2 diabetes mellitus with other circulatory complications: Secondary | ICD-10-CM | POA: Diagnosis not present

## 2021-08-11 DIAGNOSIS — E7800 Pure hypercholesterolemia, unspecified: Secondary | ICD-10-CM | POA: Diagnosis not present

## 2021-08-11 DIAGNOSIS — I471 Supraventricular tachycardia: Secondary | ICD-10-CM | POA: Diagnosis not present

## 2021-08-17 DIAGNOSIS — R3914 Feeling of incomplete bladder emptying: Secondary | ICD-10-CM | POA: Diagnosis not present

## 2021-08-18 DIAGNOSIS — E875 Hyperkalemia: Secondary | ICD-10-CM | POA: Diagnosis not present

## 2021-08-19 LAB — BASIC METABOLIC PANEL
BUN/Creatinine Ratio: 15 (ref 10–24)
BUN: 18 mg/dL (ref 8–27)
CO2: 25 mmol/L (ref 20–29)
Calcium: 9.2 mg/dL (ref 8.6–10.2)
Chloride: 99 mmol/L (ref 96–106)
Creatinine, Ser: 1.21 mg/dL (ref 0.76–1.27)
Glucose: 107 mg/dL — ABNORMAL HIGH (ref 70–99)
Potassium: 5.1 mmol/L (ref 3.5–5.2)
Sodium: 138 mmol/L (ref 134–144)
eGFR: 58 mL/min/{1.73_m2} — ABNORMAL LOW (ref >59)

## 2021-08-23 DIAGNOSIS — H903 Sensorineural hearing loss, bilateral: Secondary | ICD-10-CM | POA: Diagnosis not present

## 2021-08-23 DIAGNOSIS — E1151 Type 2 diabetes mellitus with diabetic peripheral angiopathy without gangrene: Secondary | ICD-10-CM

## 2021-08-23 DIAGNOSIS — Z9621 Cochlear implant status: Secondary | ICD-10-CM | POA: Diagnosis not present

## 2021-08-23 DIAGNOSIS — H6123 Impacted cerumen, bilateral: Secondary | ICD-10-CM | POA: Diagnosis not present

## 2021-08-23 HISTORY — DX: Type 2 diabetes mellitus with diabetic peripheral angiopathy without gangrene: E11.51

## 2021-08-26 ENCOUNTER — Encounter: Payer: Self-pay | Admitting: Cardiology

## 2021-08-26 ENCOUNTER — Ambulatory Visit (INDEPENDENT_AMBULATORY_CARE_PROVIDER_SITE_OTHER): Payer: Medicare Other | Admitting: Cardiology

## 2021-08-26 VITALS — BP 140/62 | HR 53 | Ht 70.0 in | Wt 118.0 lb

## 2021-08-26 DIAGNOSIS — E7800 Pure hypercholesterolemia, unspecified: Secondary | ICD-10-CM | POA: Diagnosis not present

## 2021-08-26 DIAGNOSIS — I10 Essential (primary) hypertension: Secondary | ICD-10-CM

## 2021-08-26 DIAGNOSIS — I471 Supraventricular tachycardia: Secondary | ICD-10-CM | POA: Diagnosis not present

## 2021-08-26 DIAGNOSIS — I420 Dilated cardiomyopathy: Secondary | ICD-10-CM

## 2021-08-26 DIAGNOSIS — E1159 Type 2 diabetes mellitus with other circulatory complications: Secondary | ICD-10-CM

## 2021-08-26 MED ORDER — AMIODARONE HCL 100 MG PO TABS: 100.0000 mg | ORAL_TABLET | Freq: Every day | ORAL | 3 refills | Status: DC

## 2021-08-26 MED ORDER — METOPROLOL SUCCINATE ER 25 MG PO TB24: 12.5000 mg | ORAL_TABLET | Freq: Every day | ORAL | 3 refills | Status: DC

## 2021-08-26 MED ORDER — VALSARTAN 40 MG PO TABS: 40.0000 mg | ORAL_TABLET | Freq: Every day | ORAL | 3 refills | Status: DC

## 2021-08-26 MED ORDER — ATORVASTATIN CALCIUM 10 MG PO TABS: 10.0000 mg | ORAL_TABLET | Freq: Every evening | ORAL | 3 refills | Status: DC

## 2021-08-26 NOTE — Patient Instructions (Addendum)
Medication Instructions:  ?Your physician has recommended you make the following change in your medication:  ?Decrease Amiodarone to '100mg'$  by mouth every day. You can take 1/2 of you current medication and the next refill will be one table of the '100mg'$  daily. ?Decrease Atorvastatin (Lipitor) to '10mg'$  by mouth every day. You can take 1/2 of you current medication and the next refill will be one table of the '10mg'$  daily. ?Decrease Metoprolol to 12.'5mg'$  by mouth every day. You can take 1/2 of you current medication daily. ?  ?*If you need a refill on your cardiac medications before your next appointment, please call your pharmacy* ? ? ?Lab Work: ?Your physician recommends that you have blood work today-  BMET ?If you have labs (blood work) drawn today and your tests are completely normal, you will receive your results only by: ?MyChart Message (if you have MyChart) OR ?A paper copy in the mail ?If you have any lab test that is abnormal or we need to change your treatment, we will call you to review the results. ? ? ?Testing/Procedures: ?None ? ? ?Follow-Up: ?At [facility], you and your health needs are our priority.  As part of our continuing mission to provide you with exceptional heart care, we have created designated Provider Care Teams.  These Care Teams include your primary Cardiologist (physician) and Advanced Practice Providers (APPs -  Physician Assistants and Nurse Practitioners) who all work together to provide you with the care you need, when you need it. ? ?We recommend signing up for the patient portal called "MyChart".  Sign up information is provided on this After Visit Summary.  MyChart is used to connect with patients for Virtual Visits (Telemedicine).  Patients are able to view lab/test results, encounter notes, upcoming appointments, etc.  Non-urgent messages can be sent to your provider as well.   ?To learn more about what you can do with MyChart, go to [email or web address].   ? ?Your next  appointment:   ?3 month(s) ? ?The format for your next appointment:   ?In Person ? ?Provider:   ?Jyl Heinz, MD  ? ? ?Other Instructions ?None . ?

## 2021-08-26 NOTE — Progress Notes (Signed)
?Cardiology Office Note:   ? ?Date:  08/26/2021  ? ?ID:  Raymond Fowler, DOB 1933-05-21, MRN [id number] ? ?PCP:  Orpah Melter, MD  ?Cardiologist:  Jenean Lindau, MD  ? ?Referring MD: Orpah Melter, MD  ? ? ?ASSESSMENT:   ? ?1. Dilated cardiomyopathy ([place])   ?2. Essential hypertension   ?3. SVT (supraventricular tachycardia) ([place])   ?4. Type 2 diabetes mellitus with other circulatory complications ([place])   ?5. Pure hypercholesterolemia   ? ?PLAN:   ? ?In order of problems listed above: ? ?Primary prevention stressed with the patient.  Importance of compliance with diet medication stressed any vocalized understanding. ?Essential hypertension: Blood pressure is fine.  Heart rate is bradycardic.  I have cut down his beta-blocker to half dose which is 12.5 mg of metoprolol succinate on a daily basis.  He will keep a track of his heart rate and blood pressure.  He will have a Chem-7 today. ?History of SVT: I will cut down amiodarone to 100 mg daily.  He has had no palpitations or such issues. ?Diabetes mellitus and mixed dyslipidemia: Followed by primary care.  Lipids are significantly low.  Cut down statin dose to half also.  Diet emphasized. ?Patient will be seen in follow-up appointment in 6 months or earlier if the patient has any concerns ? ? ? ?Medication Adjustments/Labs and Tests Ordered: ?Current medicines are reviewed at length with the patient today.  Concerns regarding medicines are outlined above.  ?No orders of the defined types were placed in this encounter. ? ?No orders of the defined types were placed in this encounter. ? ? ? ?No chief complaint on file. ?  ? ?History of Present Illness:   ? ?Raymond Fowler is a 88 y.o. male.  Patient has past medical history of cardiomyopathy with moderately depressed ejection fraction, essential hypertension, dyslipidemia, diabetes mellitus and SVT.  Patient was on amiodarone.  In the past his blood pressure was low so I changed his medications from Entresto to losartan.   Subsequently has done well.  No chest pain orthopnea or PND.  He is happy about the way he feels.  At the time of my evaluation, the patient is alert awake oriented and in no distress. ? ?Past Medical History:  ?Diagnosis Date  ? Bilateral hearing loss 07/29/2015  ? Bilateral impacted cerumen 07/29/2015  ? BPH (benign prostatic hyperplasia) 07/12/2021  ? Cardiac murmur 07/09/2021  ? Cerebrovascular disease 11/04/2016  ? Cochlear implant in place 02/24/2020  ? COVID-19 virus infection 07/12/2021  ? Diabetes mellitus without complication ([place])   ? Diet-controlled diabetes mellitus ([place]) 07/09/2021  ? Dilated cardiomyopathy ([place]) 07/14/2021  ? Dizziness and giddiness 11/04/2016  ? Essential hypertension 07/06/2021  ? Frailty 07/06/2021  ? Hardening of the aorta (main artery of the heart) ([place]) 07/06/2021  ? Hypertension   ? Hypomagnesemia 07/12/2021  ? Hyponatremia 07/12/2021  ? Hypophosphatemia 07/13/2021  ? Normocytic anemia 07/12/2021  ? Osteoporosis 07/06/2021  ? Palpitations 07/09/2021  ? Pathological fracture of vertebra 07/06/2021  ? Protein calorie malnutrition ([place]) 07/06/2021  ? Protein-calorie malnutrition, severe 07/12/2021  ? Pure hypercholesterolemia 07/06/2021  ? Sensorineural hearing loss (SNHL) of both ears 02/27/2018  ? Subclinical hypothyroidism 07/13/2021  ? Supraventricular tachycardia ([place]) 07/09/2021  ? SVT (supraventricular tachycardia) ([place]) 07/11/2021  ? Thrombocytopenia ([place]) 07/12/2021  ? Type 2 diabetes mellitus with other circulatory complications ([place]) 12/02/4490  ? Wears hearing aid in right ear 03/10/2020  ? ? ?Past Surgical History:  ?Procedure Laterality Date  ?  APPENDECTOMY    ? CHOLECYSTECTOMY    ? COCHLEAR IMPLANT    ? EYE SURGERY    ? FRACTURE SURGERY    ? HERNIA REPAIR    ? TONSILLECTOMY    ? ? ?Current Medications: ?Current Meds  ?Medication Sig  ? alendronate (FOSAMAX) 70 MG tablet Take 70 mg by mouth once a week.  ? amiodarone (PACERONE) 200 MG tablet Take 200 mg by mouth daily in the afternoon.  ? Ascorbic Acid  (VITAMIN C) 1000 MG tablet Take 1,000 mg by mouth daily.  ? aspirin EC 325 MG tablet Take 325 mg by mouth daily.  ? atorvastatin (LIPITOR) 20 MG tablet Take 20 mg by mouth every evening.  ? brimonidine (ALPHAGAN) 0.2 % ophthalmic solution Place 1 drop into both eyes 2 (two) times daily.  ? carboxymethylcellul-glycerin (REFRESH OPTIVE) 0.5-0.9 % ophthalmic solution Place 1 drop into both eyes 2 (two) times daily.  ? cycloSPORINE (RESTASIS) 0.05 % ophthalmic emulsion Place 1 drop into both eyes 2 (two) times daily.  ? finasteride (PROSCAR) 5 MG tablet Take 5 mg by mouth daily.  ? metoprolol succinate (TOPROL-XL) 25 MG 24 hr tablet Take 1 tablet (25 mg total) by mouth daily. Take with or immediately following a meal.  ? Multiple Vitamins-Minerals (MULTIVITAMIN WITH MINERALS) tablet Take 1 tablet by mouth daily.  ? Omega-3 Fatty Acids (FISH OIL) 1200 MG CAPS Take 1,200 mg by mouth daily.  ? ondansetron (ZOFRAN-ODT) 4 MG disintegrating tablet Take 1 tablet (4 mg total) by mouth every 8 (eight) hours as needed for nausea or vomiting.  ? silodosin (RAPAFLO) 8 MG CAPS capsule Take 8 mg by mouth daily.  ? valsartan (DIOVAN) 40 MG tablet Take 1 tablet (40 mg total) by mouth daily.  ?  ? ?Allergies:   Patient has no known allergies.  ? ?Social History  ? ?Socioeconomic History  ? Marital status: Married  ?  Spouse name: Mikle Bosworth  ? Number of children: 2  ? Years of education: Not on file  ? Highest education level: Not on file  ?Occupational History  ? Occupation: Retired  ?Tobacco Use  ? Smoking status: Never  ? Smokeless tobacco: Never  ?Vaping Use  ? Vaping Use: Never used  ?Substance and Sexual Activity  ? Alcohol use: Yes  ?  Comment: rare  ? Drug use: No  ? Sexual activity: Not Currently  ?Other Topics Concern  ? Not on file  ?Social History Narrative  ? Lives with wife  ? Caffeine use: 4 cups daily  ? Right handed  ? ?Social Determinants of Health  ? ?Financial Resource Strain: Not on file  ?Food Insecurity: Not on  file  ?Transportation Needs: Not on file  ?Physical Activity: Not on file  ?Stress: Not on file  ?Social Connections: Not on file  ?  ? ?Family History: ?The patient's family history includes Diabetes in his sister. ? ?ROS:   ?Please see the history of present illness.    ?All other systems reviewed and are negative. ? ?EKGs/Labs/Other Studies Reviewed:   ? ?The following studies were reviewed today: ?EKG reveals sinus rhythm with ventricular hypertrophy and nonspecific ST-T changes ? ? ?Recent Labs: ?07/12/2021: TSH 10.609 ?07/18/2021: Hemoglobin 11.9; Magnesium 1.7; Platelets 290 ?08/09/2021: ALT 15 ?08/18/2021: BUN 18; Creatinine, Ser 1.21; Potassium 5.1; Sodium 138  ?Recent Lipid Panel ?   ?Component Value Date/Time  ? CHOL 109 07/12/2021 0240  ? TRIG 39 07/12/2021 0240  ? HDL 53 07/12/2021 0240  ? CHOLHDL  2.1 07/12/2021 0240  ? VLDL 8 07/12/2021 0240  ? [place] 48 07/12/2021 0240  ? ? ?Physical Exam:   ? ?VS:  BP 140/62   Pulse (!) 53   Ht '5\' 10"'$  (1.778 m)   Wt 118 lb 0.6 oz (53.5 kg)   SpO2 94%   BMI 16.94 kg/m?    ? ?Wt Readings from Last 3 Encounters:  ?08/26/21 118 lb 0.6 oz (53.5 kg)  ?08/09/21 121 lb (54.9 kg)  ?07/18/21 119 lb 11.4 oz (54.3 kg)  ?  ? ?GEN: Patient is in no acute distress ?HEENT: Normal ?NECK: No JVD; No carotid bruits ?LYMPHATICS: No lymphadenopathy ?CARDIAC: Hear sounds regular, 2/6 systolic murmur at the apex. ?RESPIRATORY:  Clear to auscultation without rales, wheezing or rhonchi  ?ABDOMEN: Soft, non-tender, non-distended ?MUSCULOSKELETAL:  No edema; No deformity  ?SKIN: Warm and dry ?NEUROLOGIC:  Alert and oriented x 3 ?PSYCHIATRIC:  Normal affect  ? ?Signed, ?Jenean Lindau, MD  ?08/26/2021 1:24 PM    ?[place]  ?

## 2021-08-27 LAB — BASIC METABOLIC PANEL
BUN/Creatinine Ratio: 16 (ref 10–24)
BUN: 16 mg/dL (ref 8–27)
CO2: 26 mmol/L (ref 20–29)
Calcium: 8.8 mg/dL (ref 8.6–10.2)
Chloride: 99 mmol/L (ref 96–106)
Creatinine, Ser: 1.02 mg/dL (ref 0.76–1.27)
Glucose: 96 mg/dL (ref 70–99)
Potassium: 4.4 mmol/L (ref 3.5–5.2)
Sodium: 138 mmol/L (ref 134–144)
eGFR: 71 mL/min/{1.73_m2} (ref >59)

## 2021-08-31 MED ORDER — ATORVASTATIN CALCIUM 10 MG PO TABS: 10.0000 mg | ORAL_TABLET | Freq: Every evening | ORAL | 3 refills | Status: AC

## 2021-08-31 MED ORDER — METOPROLOL SUCCINATE ER 25 MG PO TB24: 12.5000 mg | ORAL_TABLET | Freq: Every day | ORAL | 3 refills | Status: DC

## 2021-08-31 MED ORDER — AMIODARONE HCL 100 MG PO TABS: 100.0000 mg | ORAL_TABLET | Freq: Every day | ORAL | 3 refills | Status: DC

## 2021-08-31 MED ORDER — VALSARTAN 40 MG PO TABS: 40.0000 mg | ORAL_TABLET | Freq: Every day | ORAL | 3 refills | Status: DC

## 2021-08-31 NOTE — Addendum Note (Signed)
Addended by: Truddie Hidden on: 08/31/2021 08:47 AM ? ? Modules accepted: Orders ? ?

## 2021-09-08 DIAGNOSIS — H903 Sensorineural hearing loss, bilateral: Secondary | ICD-10-CM | POA: Diagnosis not present

## 2021-09-14 DIAGNOSIS — R3914 Feeling of incomplete bladder emptying: Secondary | ICD-10-CM | POA: Diagnosis not present

## 2021-10-07 DIAGNOSIS — H43813 Vitreous degeneration, bilateral: Secondary | ICD-10-CM | POA: Diagnosis not present

## 2021-10-07 DIAGNOSIS — D3131 Benign neoplasm of right choroid: Secondary | ICD-10-CM | POA: Diagnosis not present

## 2021-10-07 DIAGNOSIS — H04123 Dry eye syndrome of bilateral lacrimal glands: Secondary | ICD-10-CM | POA: Diagnosis not present

## 2021-10-07 DIAGNOSIS — H40023 Open angle with borderline findings, high risk, bilateral: Secondary | ICD-10-CM | POA: Diagnosis not present

## 2021-10-13 DIAGNOSIS — R338 Other retention of urine: Secondary | ICD-10-CM | POA: Diagnosis not present

## 2021-11-16 DIAGNOSIS — N319 Neuromuscular dysfunction of bladder, unspecified: Secondary | ICD-10-CM | POA: Diagnosis not present

## 2021-12-01 ENCOUNTER — Ambulatory Visit: Payer: Medicare Other | Admitting: Cardiology

## 2021-12-10 ENCOUNTER — Ambulatory Visit: Payer: Medicare Other | Admitting: Cardiology

## 2021-12-15 DIAGNOSIS — N319 Neuromuscular dysfunction of bladder, unspecified: Secondary | ICD-10-CM | POA: Diagnosis not present

## 2022-01-12 DIAGNOSIS — R338 Other retention of urine: Secondary | ICD-10-CM | POA: Diagnosis not present

## 2022-01-17 ENCOUNTER — Telehealth: Payer: Self-pay | Admitting: Cardiology

## 2022-01-17 MED ORDER — METOPROLOL SUCCINATE ER 25 MG PO TB24: 12.5000 mg | ORAL_TABLET | Freq: Every day | ORAL | 3 refills | Status: DC

## 2022-01-17 NOTE — Telephone Encounter (Signed)
*  STAT* If patient is at the pharmacy, call can be transferred to refill team.   1. Which medications need to be refilled? (please list name of each medication and dose if known)   metoprolol succinate (TOPROL-XL) 25 MG 24 hr tablet (Expired)    2. Which pharmacy/location (including street and city if local pharmacy) is medication to be sent to?  [facility] ([facility] - [place], [place] [address]. Do they need a 30 day or 90 day supply?  90 day

## 2022-01-18 DIAGNOSIS — Z9621 Cochlear implant status: Secondary | ICD-10-CM | POA: Diagnosis not present

## 2022-01-18 DIAGNOSIS — Z45321 Encounter for adjustment and management of cochlear device: Secondary | ICD-10-CM | POA: Diagnosis not present

## 2022-01-27 ENCOUNTER — Ambulatory Visit: Payer: Medicare Other | Attending: Cardiology | Admitting: Cardiology

## 2022-01-27 ENCOUNTER — Encounter: Payer: Self-pay | Admitting: Cardiology

## 2022-01-27 VITALS — BP 162/84 | HR 69 | Ht 69.0 in | Wt 118.1 lb

## 2022-01-27 DIAGNOSIS — Z1321 Encounter for screening for nutritional disorder: Secondary | ICD-10-CM | POA: Diagnosis not present

## 2022-01-27 DIAGNOSIS — E119 Type 2 diabetes mellitus without complications: Secondary | ICD-10-CM

## 2022-01-27 DIAGNOSIS — I10 Essential (primary) hypertension: Secondary | ICD-10-CM | POA: Diagnosis not present

## 2022-01-27 DIAGNOSIS — I420 Dilated cardiomyopathy: Secondary | ICD-10-CM

## 2022-01-27 DIAGNOSIS — E1159 Type 2 diabetes mellitus with other circulatory complications: Secondary | ICD-10-CM

## 2022-01-27 DIAGNOSIS — E7800 Pure hypercholesterolemia, unspecified: Secondary | ICD-10-CM

## 2022-01-27 NOTE — Patient Instructions (Addendum)
Medication Instructions:  Your physician recommends that you continue on your current medications as directed. Please refer to the Current Medication list given to you today.  *If you need a refill on your cardiac medications before your next appointment, please call your pharmacy*   Lab Work: Your physician recommends that you return for lab work in: the next few days. You need to have labs done when you are fasting. You do not need to make an appointment as the order has already been placed. The labs you are going to have done are BMET, CBC, TSH,  LFT and Lipids.   [address] in [place]. They also close daily for lunch for 12-1.   [place] [address] 2nd floor M-W 8-11:30 and 1-4:30 and Thursday and Friday 8-11:30.  If you have labs (blood work) drawn today and your tests are completely normal, you will receive your results only by: [place] (if you have MyChart) OR A paper copy in the mail If you have any lab test that is abnormal or we need to change your treatment, we will call you to review the results.   Testing/Procedures: None ordered   Follow-Up: At [facility], you and your health needs are our priority.  As part of our continuing mission to provide you with exceptional heart care, we have created designated Provider Care Teams.  These Care Teams include your primary Cardiologist (physician) and Advanced Practice Providers (APPs -  Physician Assistants and Nurse Practitioners) who all work together to provide you with the care you need, when you need it.  We recommend signing up for the patient portal called "MyChart".  Sign up information is provided on this After Visit Summary.  MyChart is used to connect with patients for Virtual Visits (Telemedicine).  Patients are able to view lab/test results, encounter notes, upcoming appointments, etc.  Non-urgent messages can be sent to your provider as well.   To learn more about what you  can do with MyChart, go to [email or web address].    Your next appointment:   9 month(s)  The format for your next appointment:   In Person  Provider:   Jyl Heinz, MD   Other Instructions NA

## 2022-01-27 NOTE — Addendum Note (Signed)
Addended by: Truddie Hidden on: 01/27/2022 03:08 PM   Modules accepted: Orders

## 2022-01-27 NOTE — Progress Notes (Signed)
Cardiology Office Note:    Date:  01/27/2022   ID:  Raymond Fowler, DOB July 04, 1932, MRN [id number]  PCP:  Raymond Melter, MD  Cardiologist:  Raymond Lindau, MD   Referring MD: Raymond Melter, MD    ASSESSMENT:    1. Essential hypertension   2. Dilated cardiomyopathy ([place])   3. Pure hypercholesterolemia   4. Type 2 diabetes mellitus with other circulatory complications (H[facility])    PLAN:    In order of problems listed above:  Primary prevention stressed with the patient.  Importance of compliance with diet and medication stressed and he vocalized understanding.  He was advised to ambulate to the best of his ability. Essential hypertension: Blood pressure stable and diet was emphasized.  Lifestyle modification urged.  He mentions to me that his blood pressure stable at home and much better and wife agrees and mentioned to be the numbers. Mixed dyslipidemia: On statin therapy and followed by [place].  We will check blood work today. Diabetes mellitus: Diet emphasized.  He is doing his best to follow a diet. Patient will be seen in follow-up appointment in 6 months or earlier if the patient has any concerns    Medication Adjustments/Labs and Tests Ordered: Current medicines are reviewed at length with the patient today.  Concerns regarding medicines are outlined above.  No orders of the defined types were placed in this encounter.  No orders of the defined types were placed in this encounter.    No chief complaint on file.    History of Present Illness:    Raymond Fowler is a 88 y.o. male.  Patient has past medical history of essential hypertension, dyslipidemia, diabetes mellitus and dilated cardiomyopathy.  She denies any problems at this time and takes care of activities of daily living.  No chest pain orthopnea or PND.  At the time of my evaluation, the patient is alert awake oriented and in no distress.  He is an active gentleman.  No dizziness no falls.  Occasionally he says he  feels "loopy".  Wife mentions to me that at those times went she checks her heart rate and blood pressure they are stable.  Heart rate is greater than 50 and blood pressure is at least 154 systolic.  He mentions to me that his blood pressure runs around that in general.  No history of syncope.  At the time of my evaluation, the patient is alert awake oriented and in no distress.  Past Medical History:  Diagnosis Date   Bilateral hearing loss 07/29/2015   Bilateral impacted cerumen 07/29/2015   BPH (benign prostatic hyperplasia) 07/12/2021   Cardiac murmur 07/09/2021   Cerebrovascular disease 11/04/2016   Cochlear implant in place 02/24/2020   COVID-19 virus infection 07/12/2021   Diabetes mellitus without complication ([place])    Diet-controlled diabetes mellitus ([place]) 07/09/2021   Dilated cardiomyopathy ([place]) 07/14/2021   Dizziness and giddiness 11/04/2016   Essential hypertension 07/06/2021   Frailty 07/06/2021   Hardening of the aorta (main artery of the heart) ([place]) 07/06/2021   Hypertension    Hypomagnesemia 07/12/2021   Hyponatremia 07/12/2021   Hypophosphatemia 07/13/2021   Normocytic anemia 07/12/2021   Osteoporosis 07/06/2021   Palpitations 07/09/2021   Pathological fracture of vertebra 07/06/2021   Peripheral vascular disorder due to diabetes mellitus ([place]) 08/23/2021   Protein calorie malnutrition ([place]) 07/06/2021   Protein-calorie malnutrition, severe 07/12/2021   Pure hypercholesterolemia 07/06/2021   Sensorineural hearing loss (SNHL) of both ears 02/27/2018   Subclinical hypothyroidism 07/13/2021  Supraventricular tachycardia ([place]) 07/09/2021   SVT (supraventricular tachycardia) ([place]) 07/11/2021   Thrombocytopenia ([place]) 07/12/2021   Type 2 diabetes mellitus with other circulatory complications ([place]) 12/31/6657   Wears hearing aid in right ear 03/10/2020    Past Surgical History:  Procedure Laterality Date   APPENDECTOMY     CHOLECYSTECTOMY     COCHLEAR IMPLANT     EYE SURGERY     FRACTURE SURGERY      HERNIA REPAIR     TONSILLECTOMY      Current Medications: Current Meds  Medication Sig   alendronate (FOSAMAX) 70 MG tablet Take 70 mg by mouth once a week.   amiodarone (PACERONE) 100 MG tablet Take 1 tablet (100 mg total) by mouth daily in the afternoon.   Ascorbic Acid (VITAMIN C) 1000 MG tablet Take 1,000 mg by mouth daily.   aspirin EC 325 MG tablet Take 325 mg by mouth daily.   atorvastatin (LIPITOR) 10 MG tablet Take 1 tablet (10 mg total) by mouth every evening.   brimonidine (ALPHAGAN) 0.2 % ophthalmic solution Place 1 drop into both eyes 2 (two) times daily.   carboxymethylcellul-glycerin (REFRESH OPTIVE) 0.5-0.9 % ophthalmic solution Place 1 drop into both eyes 2 (two) times daily.   cycloSPORINE (RESTASIS) 0.05 % ophthalmic emulsion Place 1 drop into both eyes 2 (two) times daily.   finasteride (PROSCAR) 5 MG tablet Take 5 mg by mouth daily.   metoprolol succinate (TOPROL-XL) 25 MG 24 hr tablet Take 0.5 tablets (12.5 mg total) by mouth daily. Take with or immediately following a meal.   Multiple Vitamins-Minerals (MULTIVITAMIN WITH MINERALS) tablet Take 1 tablet by mouth daily.   Omega-3 Fatty Acids (FISH OIL) 1200 MG CAPS Take 1,200 mg by mouth daily.   silodosin (RAPAFLO) 8 MG CAPS capsule Take 8 mg by mouth daily.   valsartan (DIOVAN) 40 MG tablet Take 1 tablet (40 mg total) by mouth daily.     Allergies:   Patient has no known allergies.   Social History   Socioeconomic History   Marital status: Married    Spouse name: Raymond Fowler   Number of children: 2   Years of education: Not on file   Highest education level: Not on file  Occupational History   Occupation: Retired  Tobacco Use   Smoking status: Never   Smokeless tobacco: Never  Vaping Use   Vaping Use: Never used  Substance and Sexual Activity   Alcohol use: Yes    Comment: rare   Drug use: No   Sexual activity: Not Currently  Other Topics Concern   Not on file  Social History Narrative   Lives  with wife   Caffeine use: 4 cups daily   Right handed   Social Determinants of Health   Financial Resource Strain: Not on file  Food Insecurity: Not on file  Transportation Needs: Not on file  Physical Activity: Not on file  Stress: Not on file  Social Connections: Not on file     Family History: The patient's family history includes Diabetes in his sister.  ROS:   Please see the history of present illness.    All other systems reviewed and are negative.  EKGs/Labs/Other Studies Reviewed:    The following studies were reviewed today: EKG reveals sinus rhythm left ventricular hypertrophy and nonspecific ST-T changes   Recent Labs: 07/12/2021: TSH 10.609 07/18/2021: Hemoglobin 11.9; Magnesium 1.7; Platelets 290 08/09/2021: ALT 15 08/26/2021: BUN 16; Creatinine, Ser 1.02; Potassium 4.4; Sodium 138  Recent  Lipid Panel    Component Value Date/Time   CHOL 109 07/12/2021 0240   TRIG 39 07/12/2021 0240   HDL 53 07/12/2021 0240   CHOLHDL 2.1 07/12/2021 0240   VLDL 8 07/12/2021 0240   LDLCALC 48 07/12/2021 0240    Physical Exam:    VS:  BP (!) 162/84   Pulse 69   Ht '5\' 9"'$  (1.753 m)   Wt 118 lb 1.9 oz (53.6 kg)   SpO2 96%   BMI 17.44 kg/m     Wt Readings from Last 3 Encounters:  01/27/22 118 lb 1.9 oz (53.6 kg)  08/26/21 118 lb 0.6 oz (53.5 kg)  08/09/21 121 lb (54.9 kg)     GEN: Patient is in no acute distress HEENT: Normal NECK: No JVD; No carotid bruits LYMPHATICS: No lymphadenopathy CARDIAC: Hear sounds regular, 2/6 systolic murmur at the apex. RESPIRATORY:  Clear to auscultation without rales, wheezing or rhonchi  ABDOMEN: Soft, non-tender, non-distended MUSCULOSKELETAL:  No edema; No deformity  SKIN: Warm and dry NEUROLOGIC:  Alert and oriented x 3 PSYCHIATRIC:  Normal affect   Signed, Raymond Lindau, MD  01/27/2022 3:00 PM    [place]

## 2022-02-02 DIAGNOSIS — E7800 Pure hypercholesterolemia, unspecified: Secondary | ICD-10-CM | POA: Diagnosis not present

## 2022-02-02 DIAGNOSIS — I10 Essential (primary) hypertension: Secondary | ICD-10-CM | POA: Diagnosis not present

## 2022-02-03 LAB — HEPATIC FUNCTION PANEL
ALT: 15 IU/L (ref 0–44)
AST: 20 IU/L (ref 0–40)
Albumin: 4.5 g/dL (ref 3.7–4.7)
Alkaline Phosphatase: 39 IU/L — ABNORMAL LOW (ref 44–121)
Bilirubin Total: 0.6 mg/dL (ref 0.0–1.2)
Bilirubin, Direct: 0.21 mg/dL (ref 0.00–0.40)
Total Protein: 6.9 g/dL (ref 6.0–8.5)

## 2022-02-03 LAB — BASIC METABOLIC PANEL
BUN/Creatinine Ratio: 11 (ref 10–24)
BUN: 13 mg/dL (ref 8–27)
CO2: 24 mmol/L (ref 20–29)
Calcium: 9.3 mg/dL (ref 8.6–10.2)
Chloride: 96 mmol/L (ref 96–106)
Creatinine, Ser: 1.18 mg/dL (ref 0.76–1.27)
Glucose: 124 mg/dL — ABNORMAL HIGH (ref 70–99)
Potassium: 5.3 mmol/L — ABNORMAL HIGH (ref 3.5–5.2)
Sodium: 134 mmol/L (ref 134–144)
eGFR: 60 mL/min/{1.73_m2} (ref >59)

## 2022-02-03 LAB — LIPID PANEL
Chol/HDL Ratio: 1.9 ratio (ref 0.0–5.0)
Cholesterol, Total: 146 mg/dL (ref 100–199)
HDL: 78 mg/dL (ref >39)
LDL Chol Calc (NIH): 57 mg/dL (ref 0–99)
Triglycerides: 52 mg/dL (ref 0–149)
VLDL Cholesterol Cal: 11 mg/dL (ref 5–40)

## 2022-02-03 LAB — CBC
Hematocrit: 42.8 % (ref 37.5–51.0)
Hemoglobin: 14.3 g/dL (ref 13.0–17.7)
MCH: 30.6 pg (ref 26.6–33.0)
MCHC: 33.4 g/dL (ref 31.5–35.7)
MCV: 92 fL (ref 79–97)
Platelets: 224 10*3/uL (ref 150–450)
RBC: 4.68 x10E6/uL (ref 4.14–5.80)
RDW: 13.6 % (ref 11.6–15.4)
WBC: 5.8 10*3/uL (ref 3.4–10.8)

## 2022-02-03 LAB — TSH: TSH: 11.3 u[IU]/mL — ABNORMAL HIGH (ref 0.450–4.500)

## 2022-02-09 DIAGNOSIS — R338 Other retention of urine: Secondary | ICD-10-CM | POA: Diagnosis not present

## 2022-02-16 DIAGNOSIS — I10 Essential (primary) hypertension: Secondary | ICD-10-CM | POA: Diagnosis not present

## 2022-02-16 DIAGNOSIS — E1159 Type 2 diabetes mellitus with other circulatory complications: Secondary | ICD-10-CM | POA: Diagnosis not present

## 2022-02-16 DIAGNOSIS — I420 Dilated cardiomyopathy: Secondary | ICD-10-CM | POA: Diagnosis not present

## 2022-02-16 DIAGNOSIS — E46 Unspecified protein-calorie malnutrition: Secondary | ICD-10-CM | POA: Diagnosis not present

## 2022-02-16 DIAGNOSIS — R54 Age-related physical debility: Secondary | ICD-10-CM | POA: Diagnosis not present

## 2022-02-16 DIAGNOSIS — Z23 Encounter for immunization: Secondary | ICD-10-CM | POA: Diagnosis not present

## 2022-02-16 DIAGNOSIS — I700 Atherosclerosis of aorta: Secondary | ICD-10-CM | POA: Diagnosis not present

## 2022-02-16 DIAGNOSIS — M810 Age-related osteoporosis without current pathological fracture: Secondary | ICD-10-CM | POA: Diagnosis not present

## 2022-02-16 DIAGNOSIS — E7800 Pure hypercholesterolemia, unspecified: Secondary | ICD-10-CM | POA: Diagnosis not present

## 2022-02-16 DIAGNOSIS — I471 Supraventricular tachycardia: Secondary | ICD-10-CM | POA: Diagnosis not present

## 2022-02-16 DIAGNOSIS — R946 Abnormal results of thyroid function studies: Secondary | ICD-10-CM | POA: Diagnosis not present

## 2022-02-16 DIAGNOSIS — R7989 Other specified abnormal findings of blood chemistry: Secondary | ICD-10-CM | POA: Diagnosis not present

## 2022-02-27 ENCOUNTER — Encounter (HOSPITAL_BASED_OUTPATIENT_CLINIC_OR_DEPARTMENT_OTHER): Payer: Self-pay | Admitting: Emergency Medicine

## 2022-02-27 ENCOUNTER — Emergency Department (HOSPITAL_BASED_OUTPATIENT_CLINIC_OR_DEPARTMENT_OTHER)
Admission: EM | Admit: 2022-02-27 | Discharge: 2022-02-27 | Disposition: A | Discharge status: Home / Self Care | Payer: Medicare Other | Attending: Emergency Medicine | Admitting: Emergency Medicine

## 2022-02-27 ENCOUNTER — Emergency Department (HOSPITAL_BASED_OUTPATIENT_CLINIC_OR_DEPARTMENT_OTHER): Payer: Medicare Other

## 2022-02-27 ENCOUNTER — Other Ambulatory Visit: Payer: Self-pay

## 2022-02-27 DIAGNOSIS — I10 Essential (primary) hypertension: Secondary | ICD-10-CM | POA: Diagnosis not present

## 2022-02-27 DIAGNOSIS — E871 Hypo-osmolality and hyponatremia: Secondary | ICD-10-CM | POA: Insufficient documentation

## 2022-02-27 DIAGNOSIS — W19XXXA Unspecified fall, initial encounter: Secondary | ICD-10-CM

## 2022-02-27 DIAGNOSIS — Z7982 Long term (current) use of aspirin: Secondary | ICD-10-CM | POA: Insufficient documentation

## 2022-02-27 DIAGNOSIS — Z79899 Other long term (current) drug therapy: Secondary | ICD-10-CM | POA: Insufficient documentation

## 2022-02-27 DIAGNOSIS — S0990XA Unspecified injury of head, initial encounter: Secondary | ICD-10-CM | POA: Diagnosis not present

## 2022-02-27 DIAGNOSIS — E119 Type 2 diabetes mellitus without complications: Secondary | ICD-10-CM | POA: Diagnosis not present

## 2022-02-27 DIAGNOSIS — W01198A Fall on same level from slipping, tripping and stumbling with subsequent striking against other object, initial encounter: Secondary | ICD-10-CM | POA: Insufficient documentation

## 2022-02-27 LAB — BASIC METABOLIC PANEL
Anion gap: 6 (ref 5–15)
BUN: 13 mg/dL (ref 8–23)
CO2: 28 mmol/L (ref 22–32)
Calcium: 8.7 mg/dL — ABNORMAL LOW (ref 8.9–10.3)
Chloride: 97 mmol/L — ABNORMAL LOW (ref 98–111)
Creatinine, Ser: 1.02 mg/dL (ref 0.61–1.24)
GFR, Estimated: >60 mL/min (ref >60)
Glucose, Bld: 146 mg/dL — ABNORMAL HIGH (ref 70–99)
Potassium: 4 mmol/L (ref 3.5–5.1)
Sodium: 131 mmol/L — ABNORMAL LOW (ref 135–145)

## 2022-02-27 LAB — CBC
HCT: 38.9 % — ABNORMAL LOW (ref 39.0–52.0)
Hemoglobin: 13.2 g/dL (ref 13.0–17.0)
MCH: 30.3 pg (ref 26.0–34.0)
MCHC: 33.9 g/dL (ref 30.0–36.0)
MCV: 89.4 fL (ref 80.0–100.0)
Platelets: 220 10*3/uL (ref 150–400)
RBC: 4.35 MIL/uL (ref 4.22–5.81)
RDW: 14 % (ref 11.5–15.5)
WBC: 8.1 10*3/uL (ref 4.0–10.5)
nRBC: 0 % (ref 0.0–0.2)

## 2022-02-27 NOTE — Discharge Instructions (Signed)
The x-rays did not show any signs of serious injury.  Your blood test did show that your sodium level was a little lower than normal.  Follow-up with your primary care doctor to have that rechecked in a week to see if that persists.

## 2022-02-27 NOTE — ED Provider Notes (Signed)
[facility]ERGENCY DEPARTMENT Provider Note   CSN: [id number] Arrival date & time: 02/27/22  1020     History  Chief Complaint  Patient presents with   Lytle Michaels    Raymond Fowler is a 88 y.o. male.   Fall     Patient has history of hypertension, diabetes, stroke, hearing loss, dilated cardiomyopathy, cochlear implants, anemia, palpitations.  Patient presents to the ER for evaluation of a fall that occurred earlier this morning.  Patient states he got up falling after feeling somewhat lightheaded.  Patient states he does get lightheaded sometimes and this was not new for him.  Patient however struck his head and is having some pain in his head.  He denies any chest pain or shortness of breath.  No abdominal pain.  Does not feel lightheaded this morning.  Patient also sustained a minor laceration to his arm.  He states he put a Band-Aid on it and is not concerned about that.  Home Medications Prior to Admission medications   Medication Sig Start Date End Date Taking? Authorizing Provider  alendronate (FOSAMAX) 70 MG tablet Take 70 mg by mouth once a week. 06/16/21   [provider]  amiodarone (PACERONE) 100 MG tablet Take 1 tablet (100 mg total) by mouth daily in the afternoon. 08/31/21   Revankar, Reita Cliche, MD  Ascorbic Acid (VITAMIN C) 1000 MG tablet Take 1,000 mg by mouth daily.    [provider]  aspirin EC 325 MG tablet Take 325 mg by mouth daily.    [provider]  atorvastatin (LIPITOR) 10 MG tablet Take 1 tablet (10 mg total) by mouth every evening. 08/31/21   Revankar, Reita Cliche, MD  brimonidine (ALPHAGAN) 0.2 % ophthalmic solution Place 1 drop into both eyes 2 (two) times daily. 05/29/15   [provider]  carboxymethylcellul-glycerin (REFRESH OPTIVE) 0.5-0.9 % ophthalmic solution Place 1 drop into both eyes 2 (two) times daily.    [provider]  cycloSPORINE (RESTASIS) 0.05 % ophthalmic emulsion Place 1 drop into both eyes 2  (two) times daily.    [provider]  finasteride (PROSCAR) 5 MG tablet Take 5 mg by mouth daily. 04/19/21   [provider]  metoprolol succinate (TOPROL-XL) 25 MG 24 hr tablet Take 0.5 tablets (12.5 mg total) by mouth daily. Take with or immediately following a meal. 01/17/22 01/12/23  Revankar, Reita Cliche, MD  Multiple Vitamins-Minerals (MULTIVITAMIN WITH MINERALS) tablet Take 1 tablet by mouth daily.    [provider]  Omega-3 Fatty Acids (FISH OIL) 1200 MG CAPS Take 1,200 mg by mouth daily.    [provider]  ondansetron (ZOFRAN-ODT) 4 MG disintegrating tablet Take 1 tablet (4 mg total) by mouth every 8 (eight) hours as needed for nausea or vomiting. Patient not taking: Reported on 01/27/2022 07/05/21   Fredia Sorrow, MD  silodosin (RAPAFLO) 8 MG CAPS capsule Take 8 mg by mouth daily. 04/19/21   [provider]  valsartan (DIOVAN) 40 MG tablet Take 1 tablet (40 mg total) by mouth daily. 08/31/21   Revankar, Reita Cliche, MD      Allergies    Patient has no known allergies.    Review of Systems   Review of Systems  Physical Exam Updated Vital Signs BP (!) 143/88 (BP Location: Right Arm)   Pulse 62   Temp 97.8 F (36.6 C) (Oral)   Resp 17   Ht 1.753 m ('5\' 9"'$ )   Wt 53.5 kg   SpO2 97%  BMI 17.43 kg/m  Physical Exam Vitals and nursing note reviewed.  Constitutional:      General: He is not in acute distress.    Appearance: He is well-developed.  HENT:     Head: Normocephalic.     Comments: Mild tenderness palpation posterior occiput, no laceration or contusion noted    Right Ear: External ear normal.     Left Ear: External ear normal.  Eyes:     General: No scleral icterus.       Right eye: No discharge.        Left eye: No discharge.     Conjunctiva/sclera: Conjunctivae normal.  Neck:     Trachea: No tracheal deviation.  Cardiovascular:     Rate and Rhythm: Normal rate.  Pulmonary:     Effort: Pulmonary effort is normal. No  respiratory distress.     Breath sounds: No stridor.  Abdominal:     General: There is no distension.  Musculoskeletal:        General: No swelling, tenderness or deformity.     Cervical back: Neck supple.     Comments: No tenderness palpation bilateral upper extremities or lower extremities, no tenderness cervical thoracic or lumbar spine, patient is wearing a shirt, I asked to evaluate skin injury on his arm and he and his wife did not feel that was necessary.    Skin:    General: Skin is warm and dry.     Findings: No rash.  Neurological:     Mental Status: He is alert.     Cranial Nerves: Cranial nerve deficit: no gross deficits.     ED Results / Procedures / Treatments   Labs (all labs ordered are listed, but only abnormal results are displayed) Labs Reviewed  CBC - Abnormal; Notable for the following components:      Result Value   HCT 38.9 (*)    All other components within normal limits  BASIC METABOLIC PANEL - Abnormal; Notable for the following components:   Sodium 131 (*)    Chloride 97 (*)    Glucose, Bld 146 (*)    Calcium 8.7 (*)    All other components within normal limits    EKG EKG Interpretation  Date/Time:  Sunday February 27 2022 11:39:43 EDT Ventricular Rate:  65 PR Interval:  203 QRS Duration: 91 QT Interval:  493 QTC Calculation: 513 R Axis:   60 Text Interpretation: Sinus rhythm Anterior infarct, age indeterminate Lateral leads are also involved Prolonged QT interval No significant change since last tracing Confirmed by Dorie Rank [phone]) on 02/27/2022 11:45:01 AM  Radiology CT Head Wo Contrast  Result Date: 02/27/2022 CLINICAL DATA:  Fall this morning with head trauma. EXAM: CT HEAD WITHOUT CONTRAST CT CERVICAL SPINE WITHOUT CONTRAST TECHNIQUE: Multidetector CT imaging of the head and cervical spine was performed following the standard protocol without intravenous contrast. Multiplanar CT image reconstructions of the cervical spine were also  generated. RADIATION DOSE REDUCTION: This exam was performed according to the departmental dose-optimization program which includes automated exposure control, adjustment of the mA and/or kV according to patient size and/or use of iterative reconstruction technique. COMPARISON:  CT head 07/18/2021; CT cervical spine 02/19/2021 cochlear implant FINDINGS: CT HEAD FINDINGS Brain: No evidence of acute infarction, hemorrhage, hydrocephalus, extra-axial collection or mass lesion/mass effect. Generalized parenchymal volume loss with associated dilatation of the ventricles, similar to prior exam. Confluent hypodensities in the periventricular and subcortical white matter, consistent with chronic small-vessel ischemic changes. Vascular: Vascular  calcifications noted at the skull base. No hyperdense vessel. Skull: Normal. Negative for fracture or focal lesion. Sinuses/Orbits: No acute finding. Small mucous retention cysts in the left sphenoid sinus. Moderate mucosal thickening in a posterior left ethmoid air cell. The other visual paranasal sinuses and mastoid air cells are clear. Other: Left cochlear implant. CT CERVICAL SPINE FINDINGS Alignment: Straightening of the normal cervical lordosis with trace anterolisthesis of C3 on C4. Skull base and vertebrae: No acute fracture. No primary bone lesion or focal pathologic process. Soft tissues and spinal canal: No prevertebral fluid or swelling. No visible canal hematoma. Disc levels: Severe osteoarthritis of the atlantodental joint and moderate-to-severe osteoarthritis of the bilateral atlantooccipital joints. Moderate degenerative disc disease throughout the cervical spine with severe degenerative disc disease at the C5-C6 level. Bilateral moderate-to-severe hypertrophic facet arthropathy throughout the cervical spine. No high-grade neural foraminal narrowing. Upper chest: Negative. Other: None. IMPRESSION: CT head: 1. No acute intracranial abnormality. 2. Generalized  parenchymal volume loss and chronic small-vessel ischemic changes, similar to prior exam. CT cervical spine: 1. No fracture or traumatic malalignment of the cervical spine. 2. Multilevel degenerative changes as described body of the report, similar to prior exam. Electronically Signed   By: Ileana Roup M.D.   On: 02/27/2022 12:24   CT Cervical Spine Wo Contrast  Result Date: 02/27/2022 CLINICAL DATA:  Fall this morning with head trauma. EXAM: CT HEAD WITHOUT CONTRAST CT CERVICAL SPINE WITHOUT CONTRAST TECHNIQUE: Multidetector CT imaging of the head and cervical spine was performed following the standard protocol without intravenous contrast. Multiplanar CT image reconstructions of the cervical spine were also generated. RADIATION DOSE REDUCTION: This exam was performed according to the departmental dose-optimization program which includes automated exposure control, adjustment of the mA and/or kV according to patient size and/or use of iterative reconstruction technique. COMPARISON:  CT head 07/18/2021; CT cervical spine 02/19/2021 cochlear implant FINDINGS: CT HEAD FINDINGS Brain: No evidence of acute infarction, hemorrhage, hydrocephalus, extra-axial collection or mass lesion/mass effect. Generalized parenchymal volume loss with associated dilatation of the ventricles, similar to prior exam. Confluent hypodensities in the periventricular and subcortical white matter, consistent with chronic small-vessel ischemic changes. Vascular: Vascular calcifications noted at the skull base. No hyperdense vessel. Skull: Normal. Negative for fracture or focal lesion. Sinuses/Orbits: No acute finding. Small mucous retention cysts in the left sphenoid sinus. Moderate mucosal thickening in a posterior left ethmoid air cell. The other visual paranasal sinuses and mastoid air cells are clear. Other: Left cochlear implant. CT CERVICAL SPINE FINDINGS Alignment: Straightening of the normal cervical lordosis with trace  anterolisthesis of C3 on C4. Skull base and vertebrae: No acute fracture. No primary bone lesion or focal pathologic process. Soft tissues and spinal canal: No prevertebral fluid or swelling. No visible canal hematoma. Disc levels: Severe osteoarthritis of the atlantodental joint and moderate-to-severe osteoarthritis of the bilateral atlantooccipital joints. Moderate degenerative disc disease throughout the cervical spine with severe degenerative disc disease at the C5-C6 level. Bilateral moderate-to-severe hypertrophic facet arthropathy throughout the cervical spine. No high-grade neural foraminal narrowing. Upper chest: Negative. Other: None. IMPRESSION: CT head: 1. No acute intracranial abnormality. 2. Generalized parenchymal volume loss and chronic small-vessel ischemic changes, similar to prior exam. CT cervical spine: 1. No fracture or traumatic malalignment of the cervical spine. 2. Multilevel degenerative changes as described body of the report, similar to prior exam. Electronically Signed   By: Ileana Roup M.D.   On: 02/27/2022 12:24    Procedures Procedures    Medications Ordered  in ED Medications - No data to display  ED Course/ Medical Decision Making/ A&P Clinical Course as of 02/27/22 1248  Sun Feb 27, 2022  2025 Basic metabolic panel(!) Sodium decreased compared to previous [JK]  1240 CBC(!) Normal [JK]  1241 Head CT and C-spine CT without acute findings [JK]    Clinical Course User Index [JK] Dorie Rank, MD                           Medical Decision Making Frontal diagnosis includes but not limited to subdural hematoma, subarachnoid hemorrhage, cerebral contusion, cervical spine injury,  Problems Addressed: Fall, initial encounter: acute illness or injury Hyponatremia: undiagnosed new problem with uncertain prognosis Injury of head, initial encounter: acute illness or injury  Amount and/or Complexity of Data Reviewed Labs: ordered. Decision-making details documented in  ED Course. Radiology: ordered.   Patient presented to the ED for evaluation after fall.  Patient does have some trouble with intermittent lightheadedness.  It did not persist today .  No anemia or dehydration noted on labs.  Laboratory test do show slight decrease in sodium.  I do not think that is related to his symptoms and we will have him follow-up with his doctor to have that rechecked in a week.  No signs of any serious injury associated with his fall.  Evaluation and diagnostic testing in the emergency department does not suggest an emergent condition requiring admission or immediate intervention beyond what has been performed at this time.  The patient is safe for discharge and has been instructed to return immediately for worsening symptoms, change in symptoms or any other concerns.         Final Clinical Impression(s) / ED Diagnoses Final diagnoses:  Fall, initial encounter  Injury of head, initial encounter  Hyponatremia    Rx / DC Orders ED Discharge Orders     None         Dorie Rank, MD 02/27/22 1248

## 2022-02-27 NOTE — ED Triage Notes (Addendum)
Pt reports fell around 0300; hit head, possibly on nightstand; no thinners, no LOC; also has a wound to RUE from the fall (not visible in triage d/t long sleeves)

## 2022-03-09 DIAGNOSIS — R338 Other retention of urine: Secondary | ICD-10-CM | POA: Diagnosis not present

## 2022-03-23 DIAGNOSIS — Z0000 Encounter for general adult medical examination without abnormal findings: Secondary | ICD-10-CM | POA: Diagnosis not present

## 2022-03-23 DIAGNOSIS — E871 Hypo-osmolality and hyponatremia: Secondary | ICD-10-CM | POA: Diagnosis not present

## 2022-03-23 DIAGNOSIS — E039 Hypothyroidism, unspecified: Secondary | ICD-10-CM | POA: Diagnosis not present

## 2022-03-23 DIAGNOSIS — R54 Age-related physical debility: Secondary | ICD-10-CM | POA: Diagnosis not present

## 2022-03-25 DIAGNOSIS — Z23 Encounter for immunization: Secondary | ICD-10-CM | POA: Diagnosis not present

## 2022-04-06 DIAGNOSIS — R338 Other retention of urine: Secondary | ICD-10-CM | POA: Diagnosis not present

## 2022-04-11 DIAGNOSIS — H40023 Open angle with borderline findings, high risk, bilateral: Secondary | ICD-10-CM | POA: Diagnosis not present

## 2022-04-11 DIAGNOSIS — H04123 Dry eye syndrome of bilateral lacrimal glands: Secondary | ICD-10-CM | POA: Diagnosis not present

## 2022-04-15 DIAGNOSIS — N319 Neuromuscular dysfunction of bladder, unspecified: Secondary | ICD-10-CM | POA: Diagnosis not present

## 2022-04-15 DIAGNOSIS — R338 Other retention of urine: Secondary | ICD-10-CM | POA: Diagnosis not present

## 2022-05-04 DIAGNOSIS — E875 Hyperkalemia: Secondary | ICD-10-CM | POA: Diagnosis not present

## 2022-05-04 DIAGNOSIS — R7989 Other specified abnormal findings of blood chemistry: Secondary | ICD-10-CM | POA: Diagnosis not present

## 2022-05-04 DIAGNOSIS — R946 Abnormal results of thyroid function studies: Secondary | ICD-10-CM | POA: Diagnosis not present

## 2022-05-13 DIAGNOSIS — R338 Other retention of urine: Secondary | ICD-10-CM | POA: Diagnosis not present

## 2022-06-03 ENCOUNTER — Encounter: Payer: Self-pay | Admitting: *Deleted

## 2022-06-03 ENCOUNTER — Telehealth: Payer: Self-pay | Admitting: *Deleted

## 2022-06-03 NOTE — Patient Outreach (Signed)
  Care Coordination   Initial Visit Note   06/03/2022 Name: [place] MRN: [id number] DOB: 1933/01/26  Jakevious Hollister is a 89 y.o. year old male who sees Orpah Melter, MD for primary care. I  spoke with the spouse Mikle Bosworth today.  What matters to the patients health and wellness today?  No needs    Goals Addressed             This Visit's Progress    COMPLETED: Care coordination activity       Care Coordination Interventions: Provided education to patient and/or caregiver about advanced directives Reviewed medications with patient and discussed adherence with all medications with no needed refills Reviewed scheduled/upcoming provider appointments including Sufficient transportation source Screening for signs and symptoms of depression related to chronic disease state  Assessed social determinant of health barriers Educated on care management services with no presented needs.        SDOH assessments and interventions completed:  Yes  SDOH Interventions Today    Flowsheet Row Most Recent Value  SDOH Interventions   Food Insecurity Interventions Intervention Not Indicated  Housing Interventions Intervention Not Indicated  Transportation Interventions Intervention Not Indicated  Utilities Interventions Intervention Not Indicated        Care Coordination Interventions:  Yes, provided   Follow up plan: No further intervention required.   Encounter Outcome:  Pt. Visit Completed   Raina Mina, RN Care Management Coordinator [place] Office [phone]

## 2022-06-03 NOTE — Patient Instructions (Signed)
Visit Information  Thank you for taking time to visit with me today. Please don't hesitate to contact me if I can be of assistance to you.   Following are the goals we discussed today:   Goals Addressed             This Visit's Progress    COMPLETED: Care coordination activity       Care Coordination Interventions: Provided education to patient and/or caregiver about advanced directives Reviewed medications with patient and discussed adherence with all medications with no needed refills Reviewed scheduled/upcoming provider appointments including Sufficient transportation source Screening for signs and symptoms of depression related to chronic disease state  Assessed social determinant of health barriers Educated on care management services with no presented needs.        Please call the care guide team at [phone] if you need to cancel or reschedule your appointment.   If you are experiencing a Mental Health or [place] or need someone to talk to, please call the Suicide and Crisis Lifeline: 988  Patient verbalizes understanding of instructions and care plan provided today and agrees to view in [place]. Active MyChart status and patient understanding of how to access instructions and care plan via MyChart confirmed with patient.     No further follow up required: No follow up needs  Raina Mina, RN Care Management Coordinator [place] Office [phone]

## 2022-06-13 DIAGNOSIS — R338 Other retention of urine: Secondary | ICD-10-CM | POA: Diagnosis not present

## 2022-06-14 ENCOUNTER — Telehealth: Payer: Self-pay | Admitting: Cardiology

## 2022-06-14 NOTE — Telephone Encounter (Signed)
Spoke with patient's wife per DPR, Mikle Bosworth and informed her patient should still be taking 40 mg of Valsartan daily.  The last prescription Dr. Geraldo Pitter sent in to [facility] were for the directions fo 40 mg daily and this was last sent on 08/31/21.  She thanked me for the call and will call to ask [facility] who the prescribing doctor was as it was not Dr. Geraldo Pitter.  She thanked me for the call and had no additional questions.

## 2022-06-14 NOTE — Telephone Encounter (Signed)
Pt c/o medication issue:  1. Name of Medication: valsartan (DIOVAN) 40 MG tablet   2. How are you currently taking this medication (dosage and times per day)?   3. Are you having a reaction (difficulty breathing--STAT)?   4. What is your medication issue? Patient's wife is calling to verify it patient is suppose to be taking this medication once or twice a day.  They received a refill from [place] saying to take twice a day.  She called [facility] and they told her they received this change back in September.  Please advise.

## 2022-06-16 DIAGNOSIS — H6123 Impacted cerumen, bilateral: Secondary | ICD-10-CM | POA: Diagnosis not present

## 2022-07-14 DIAGNOSIS — R338 Other retention of urine: Secondary | ICD-10-CM | POA: Diagnosis not present

## 2022-07-29 DIAGNOSIS — E039 Hypothyroidism, unspecified: Secondary | ICD-10-CM | POA: Diagnosis not present

## 2022-08-12 DIAGNOSIS — R338 Other retention of urine: Secondary | ICD-10-CM | POA: Diagnosis not present

## 2022-09-07 DIAGNOSIS — I10 Essential (primary) hypertension: Secondary | ICD-10-CM | POA: Diagnosis not present

## 2022-09-07 DIAGNOSIS — E46 Unspecified protein-calorie malnutrition: Secondary | ICD-10-CM | POA: Diagnosis not present

## 2022-09-07 DIAGNOSIS — E1159 Type 2 diabetes mellitus with other circulatory complications: Secondary | ICD-10-CM | POA: Diagnosis not present

## 2022-09-07 DIAGNOSIS — E039 Hypothyroidism, unspecified: Secondary | ICD-10-CM | POA: Diagnosis not present

## 2022-09-07 DIAGNOSIS — I420 Dilated cardiomyopathy: Secondary | ICD-10-CM | POA: Diagnosis not present

## 2022-09-07 DIAGNOSIS — I700 Atherosclerosis of aorta: Secondary | ICD-10-CM | POA: Diagnosis not present

## 2022-09-07 DIAGNOSIS — E7800 Pure hypercholesterolemia, unspecified: Secondary | ICD-10-CM | POA: Diagnosis not present

## 2022-09-07 DIAGNOSIS — M810 Age-related osteoporosis without current pathological fracture: Secondary | ICD-10-CM | POA: Diagnosis not present

## 2022-09-07 DIAGNOSIS — R54 Age-related physical debility: Secondary | ICD-10-CM | POA: Diagnosis not present

## 2022-09-12 DIAGNOSIS — R338 Other retention of urine: Secondary | ICD-10-CM | POA: Diagnosis not present

## 2022-09-27 ENCOUNTER — Other Ambulatory Visit: Payer: Self-pay | Admitting: Cardiology

## 2022-09-27 NOTE — Telephone Encounter (Signed)
RX sent

## 2022-10-02 ENCOUNTER — Emergency Department (HOSPITAL_BASED_OUTPATIENT_CLINIC_OR_DEPARTMENT_OTHER): Payer: Medicare Other

## 2022-10-02 ENCOUNTER — Other Ambulatory Visit: Payer: Self-pay

## 2022-10-02 ENCOUNTER — Emergency Department (HOSPITAL_BASED_OUTPATIENT_CLINIC_OR_DEPARTMENT_OTHER)
Admission: EM | Admit: 2022-10-02 | Discharge: 2022-10-02 | Disposition: A | Discharge status: Home / Self Care | Payer: Medicare Other | Attending: Emergency Medicine | Admitting: Emergency Medicine

## 2022-10-02 ENCOUNTER — Encounter (HOSPITAL_BASED_OUTPATIENT_CLINIC_OR_DEPARTMENT_OTHER): Payer: Self-pay | Admitting: Emergency Medicine

## 2022-10-02 DIAGNOSIS — E871 Hypo-osmolality and hyponatremia: Secondary | ICD-10-CM | POA: Diagnosis not present

## 2022-10-02 DIAGNOSIS — I10 Essential (primary) hypertension: Secondary | ICD-10-CM | POA: Diagnosis not present

## 2022-10-02 DIAGNOSIS — M47812 Spondylosis without myelopathy or radiculopathy, cervical region: Secondary | ICD-10-CM | POA: Insufficient documentation

## 2022-10-02 DIAGNOSIS — W19XXXA Unspecified fall, initial encounter: Secondary | ICD-10-CM

## 2022-10-02 DIAGNOSIS — E039 Hypothyroidism, unspecified: Secondary | ICD-10-CM | POA: Diagnosis not present

## 2022-10-02 DIAGNOSIS — S0990XA Unspecified injury of head, initial encounter: Secondary | ICD-10-CM | POA: Diagnosis not present

## 2022-10-02 DIAGNOSIS — R109 Unspecified abdominal pain: Secondary | ICD-10-CM | POA: Insufficient documentation

## 2022-10-02 DIAGNOSIS — Z79899 Other long term (current) drug therapy: Secondary | ICD-10-CM | POA: Diagnosis not present

## 2022-10-02 DIAGNOSIS — R0781 Pleurodynia: Secondary | ICD-10-CM | POA: Diagnosis present

## 2022-10-02 DIAGNOSIS — E119 Type 2 diabetes mellitus without complications: Secondary | ICD-10-CM | POA: Insufficient documentation

## 2022-10-02 DIAGNOSIS — S2241XA Multiple fractures of ribs, right side, initial encounter for closed fracture: Secondary | ICD-10-CM

## 2022-10-02 DIAGNOSIS — W1830XA Fall on same level, unspecified, initial encounter: Secondary | ICD-10-CM | POA: Diagnosis not present

## 2022-10-02 DIAGNOSIS — S199XXA Unspecified injury of neck, initial encounter: Secondary | ICD-10-CM | POA: Diagnosis not present

## 2022-10-02 DIAGNOSIS — I6782 Cerebral ischemia: Secondary | ICD-10-CM | POA: Insufficient documentation

## 2022-10-02 LAB — CBC WITH DIFFERENTIAL/PLATELET
Abs Immature Granulocytes: 0.03 10*3/uL (ref 0.00–0.07)
Basophils Absolute: 0 10*3/uL (ref 0.0–0.1)
Basophils Relative: 0 %
Eosinophils Absolute: 0 10*3/uL (ref 0.0–0.5)
Eosinophils Relative: 0 %
HCT: 38.2 % — ABNORMAL LOW (ref 39.0–52.0)
Hemoglobin: 13.2 g/dL (ref 13.0–17.0)
Immature Granulocytes: 0 %
Lymphocytes Relative: 8 %
Lymphs Abs: 0.9 10*3/uL (ref 0.7–4.0)
MCH: 30.1 pg (ref 26.0–34.0)
MCHC: 34.6 g/dL (ref 30.0–36.0)
MCV: 87 fL (ref 80.0–100.0)
Monocytes Absolute: 1 10*3/uL (ref 0.1–1.0)
Monocytes Relative: 9 %
Neutro Abs: 8.7 10*3/uL — ABNORMAL HIGH (ref 1.7–7.7)
Neutrophils Relative %: 83 %
Platelets: 229 10*3/uL (ref 150–400)
RBC: 4.39 MIL/uL (ref 4.22–5.81)
RDW: 13.2 % (ref 11.5–15.5)
WBC: 10.5 10*3/uL (ref 4.0–10.5)
nRBC: 0 % (ref 0.0–0.2)

## 2022-10-02 LAB — BASIC METABOLIC PANEL
Anion gap: 12 (ref 5–15)
BUN: 14 mg/dL (ref 8–23)
CO2: 24 mmol/L (ref 22–32)
Calcium: 8.7 mg/dL — ABNORMAL LOW (ref 8.9–10.3)
Chloride: 90 mmol/L — ABNORMAL LOW (ref 98–111)
Creatinine, Ser: 0.89 mg/dL (ref 0.61–1.24)
GFR, Estimated: >60 mL/min (ref >60)
Glucose, Bld: 176 mg/dL — ABNORMAL HIGH (ref 70–99)
Potassium: 3.9 mmol/L (ref 3.5–5.1)
Sodium: 126 mmol/L — ABNORMAL LOW (ref 135–145)

## 2022-10-02 MED ORDER — IOHEXOL 300 MG/ML  SOLN
100.0000 mL | Freq: Once | INTRAMUSCULAR | Status: AC | PRN
  Administered 2022-10-02: 100 mL via INTRAVENOUS

## 2022-10-02 MED ORDER — SODIUM CHLORIDE 0.9 % IV BOLUS
500.0000 mL | Freq: Once | INTRAVENOUS | Status: AC
  Administered 2022-10-02: 500 mL via INTRAVENOUS

## 2022-10-02 MED ORDER — HYDROCODONE-ACETAMINOPHEN 5-325 MG PO TABS
1.0000 | ORAL_TABLET | Freq: Once | ORAL | Status: AC
  Administered 2022-10-02: 1 via ORAL
  Filled 2022-10-02: qty 1

## 2022-10-02 NOTE — ED Triage Notes (Signed)
Pt fell on Friday, landing on RT side (onto wood floor); did not hit head, no thinners; c/o pain to RT ribs/flank

## 2022-10-02 NOTE — ED Notes (Signed)
Attempted 2x on LAC. Both veins ruptured upon venipuncture. Unable to use sites will attempt again.

## 2022-10-02 NOTE — ED Notes (Addendum)
Discharge paperwork reviewed entirely with patient, including follow up care. Pain was under control. No prescriptions were called in, but all questions were addressed.  Pt verbalized understanding as well as all parties involved. No questions or concerns voiced at the time of discharge. No acute distress noted.   Pt was wheeled out to the PVA in a wheelchair without incident.   

## 2022-10-02 NOTE — ED Provider Notes (Signed)
[facility] Provider Note   CSN: [id number] Arrival date & time: 10/02/22  1156     History  Chief Complaint  Patient presents with   Marletta Lor    Raymond Fowler is a 89 y.o. male.  5 caveat for hearing impairment.  Patient had a witnessed fall 3 days ago.  He hit his right ribs on a "T cart".  Fell to the right side on a wooden floor.  Did not hit head or lose conscious.  Sustained pain to right ribs and flank area.  Taking Tylenol at home without relief.  Denies any blood thinner use.  Does take aspirin.  Increased pain today.  No shortness of breath or chest pain.  No head or neck pain.  No abdominal pain, nausea or vomiting.  No focal weakness, numbness or tingling.  Past medical history includes hypothyroidism, cardiomyopathy, hearing impairment, palpitations, diabetes and hypertension.  The history is provided by the patient and a relative.  Fall Pertinent negatives include no abdominal pain, no headaches and no shortness of breath.       Home Medications Prior to Admission medications   Medication Sig Start Date End Date Taking? Authorizing Provider  alendronate (FOSAMAX) 70 MG tablet Take 70 mg by mouth once a week. 06/16/21   [provider]  amiodarone (PACERONE) 100 MG tablet TAKE 1 TABLET BY MOUTH DAILY IN  THE AFTERNOON 09/27/22   Revankar, Aundra Dubin, MD  Ascorbic Acid (VITAMIN C) 1000 MG tablet Take 1,000 mg by mouth daily.    [provider]  aspirin EC 325 MG tablet Take 325 mg by mouth daily.    [provider]  atorvastatin (LIPITOR) 10 MG tablet Take 1 tablet (10 mg total) by mouth every evening. 08/31/21   Revankar, Aundra Dubin, MD  brimonidine (ALPHAGAN) 0.2 % ophthalmic solution Place 1 drop into both eyes 2 (two) times daily. 05/29/15   [provider]  carboxymethylcellul-glycerin (REFRESH OPTIVE) 0.5-0.9 % ophthalmic solution Place 1 drop into both eyes 2 (two) times daily.    [provider]  cycloSPORINE (RESTASIS) 0.05 % ophthalmic emulsion Place 1 drop into both eyes 2 (two) times daily.    [provider]  finasteride (PROSCAR) 5 MG tablet Take 5 mg by mouth daily. 04/19/21   [provider]  metoprolol succinate (TOPROL-XL) 25 MG 24 hr tablet Take 0.5 tablets (12.5 mg total) by mouth daily. Take with or immediately following a meal. 01/17/22 01/12/23  Revankar, Aundra Dubin, MD  Multiple Vitamins-Minerals (MULTIVITAMIN WITH MINERALS) tablet Take 1 tablet by mouth daily.    [provider]  Omega-3 Fatty Acids (FISH OIL) 1200 MG CAPS Take 1,200 mg by mouth daily.    [provider]  ondansetron (ZOFRAN-ODT) 4 MG disintegrating tablet Take 1 tablet (4 mg total) by mouth every 8 (eight) hours as needed for nausea or vomiting. Patient not taking: Reported on 01/27/2022 07/05/21   Vanetta Mulders, MD  silodosin (RAPAFLO) 8 MG CAPS capsule Take 8 mg by mouth daily. 04/19/21   [provider]  valsartan (DIOVAN) 40 MG tablet Take 1 tablet (40 mg total) by mouth daily. 08/31/21   Revankar, Aundra Dubin, MD      Allergies    Patient has no known allergies.    Review of Systems   Review of Systems  Constitutional:  Negative for activity change, appetite change and fever.  HENT:  Negative for congestion and rhinorrhea.   Respiratory:  Negative for cough, chest tightness and shortness of breath.   Gastrointestinal:  Negative for abdominal pain, nausea and vomiting.  Genitourinary:  Negative for dysuria.  Musculoskeletal:  Positive for arthralgias and myalgias.  Skin:  Negative for rash.  Neurological:  Negative for weakness and headaches.   all other systems are negative except as noted in the HPI and PMH.    Physical Exam Updated Vital Signs BP (!) 145/82 (BP Location: Right Arm)   Pulse 85   Temp 97.9 F (36.6 C) (Oral)   Resp 17   Ht 5\' 9"  (1.753 m)   Wt 51.7 kg   SpO2 93%   BMI 16.83 kg/m  Physical Exam Vitals and nursing  note reviewed.  Constitutional:      General: He is not in acute distress.    Appearance: He is well-developed.  HENT:     Head: Normocephalic and atraumatic.     Mouth/Throat:     Pharynx: No oropharyngeal exudate.  Eyes:     Conjunctiva/sclera: Conjunctivae normal.     Pupils: Pupils are equal, round, and reactive to light.  Neck:     Comments: No C-spine tenderness Cardiovascular:     Rate and Rhythm: Normal rate and regular rhythm.     Heart sounds: Normal heart sounds. No murmur heard. Pulmonary:     Effort: Pulmonary effort is normal. No respiratory distress.     Breath sounds: Normal breath sounds.     Comments: Right flank and lower rib ecchymosis and bruising. Chest:     Chest wall: Tenderness present.  Abdominal:     Palpations: Abdomen is soft.     Tenderness: There is no abdominal tenderness. There is no guarding or rebound.  Musculoskeletal:        General: No tenderness. Normal range of motion.     Cervical back: Normal range of motion and neck supple.     Comments: No midline T or L-spine tenderness  Full range of motion hips bilaterally without pain.  Skin:    General: Skin is warm.  Neurological:     Mental Status: He is alert and oriented to person, place, and time.     Cranial Nerves: No cranial nerve deficit.     Motor: No abnormal muscle tone.     Coordination: Coordination normal.     Comments: No ataxia on finger to nose bilaterally. No pronator drift. 5/5 strength throughout. CN 2-12 intact.Equal grip strength. Sensation intact.   Psychiatric:        Behavior: Behavior normal.     ED Results / Procedures / Treatments   Labs (all labs ordered are listed, but only abnormal results are displayed) Labs Reviewed  CBC WITH DIFFERENTIAL/PLATELET - Abnormal; Notable for the following components:      Result Value   HCT 38.2 (*)    Neutro Abs 8.7 (*)    All other components within normal limits  BASIC METABOLIC PANEL - Abnormal; Notable for the  following components:   Sodium 126 (*)    Chloride 90 (*)    Glucose, Bld 176 (*)    Calcium 8.7 (*)    All other components within normal limits  URINALYSIS, ROUTINE W REFLEX MICROSCOPIC    EKG None  Radiology DG Ribs Unilateral W/Chest Right  Result Date: 10/02/2022 CLINICAL DATA:  Fall.  Pain and posterior right lower lobe ribs. EXAM: RIGHT RIBS AND CHEST - 3+ VIEW COMPARISON:  Chest radiograph 07/14/2021. FINDINGS: Three views of the chest and right  ribs. Acute, mildly displaced fractures of the right ninth, tenth, eleventh and twelfth ribs. No pneumothorax or pleural effusion. Both lungs are clear. Stable cardiac and mediastinal contours. IMPRESSION: Acute, mildly displaced fractures of the right ninth through twelfth ribs. Electronically Signed   By: Orvan Falconer M.D.   On: 10/02/2022 12:40    Procedures Procedures    Medications Ordered in ED Medications - No data to display  ED Course/ Medical Decision Making/ A&P Clinical Course as of 10/02/22 1544  Sun Oct 02, 2022  1536 Stable Fall x3 days Cts pending SMDM with family regarding dispo Hyponatremia as possible etiology for weakness [CC]    Clinical Course User Index [CC] Glyn Ade, MD                             Medical Decision Making Amount and/or Complexity of Data Reviewed Labs: ordered. Decision-making details documented in ED Course. Radiology: ordered and independent interpretation performed. Decision-making details documented in ED Course. ECG/medicine tests: ordered and independent interpretation performed. Decision-making details documented in ED Course.  Risk Prescription drug management.   Fall with right rib pain and swelling.  No head injury.  Vital stable, no distress, no hypoxia or increased work of breathing.  Equal breath sounds bilaterally.  No blood thinner use.  X-ray shows multiple rib fractures on the right side, no pneumothorax  No hypoxia or increased work of breathing.   Does have ecchymosis and bruising to his right side.  Abdomen is soft and nontender. Given age with multiple rib fractures, will obtain CT imaging to rule out intrathoracic or intra-abdominal injury.  Screening labs to be obtained.  Does show hyponatremia of 126 which may be contribute to his weakness.  Will hydrate gently.  CT is pending at time of shift change.  Dr. Doran Durand to assume care.       Final Clinical Impression(s) / ED Diagnoses Final diagnoses:  None    Rx / DC Orders ED Discharge Orders     None         Jannah Guardiola, Jeannett Senior, MD 10/02/22 1545

## 2022-10-02 NOTE — ED Provider Notes (Signed)
Care of patient received from prior provider at 5:20 PM, please see their note for complete H/P and care plan.  Received handoff per ED course.  Clinical Course as of 10/02/22 Raymond Fowler Oct 02, 2022  1536 Stable Fall x3 days Cts pending SMDM with family regarding dispo Hyponatremia as possible etiology for weakness [CC]    Clinical Course User Index [CC] Glyn Ade, MD    Reassessment: Reassessed at bedside.  Patient does have significant right-sided chest pain.  Given 5 rib fracture I recommended admission for further pain control, incentive spirometry management and patient has declined.  He states he wants to go home despite understanding of risk of worsening disease pneumonia and even potential life threat. Considered underlying laboratory abnormalities as a possible etiology for his weakness and falls.  Discussed this with the family member at bedside recommended admission for further diagnostic and management of these abnormalities but again patient has declined stating he would follow-up with his normal doctor as he does not want to be in the hospital.  After this conversation, patient has expressed understanding of risk of discharge and will follow-up with his primary care provider. Disposition:  Patient is requesting discharge at this time.  Given patient's understanding of risk of severe missed diagnosis based on limitations of today's evaluation and risk of interval worsening of disease including life or limb threatening pathology, will participate in shared medical decision making and patient directed discharge at this time.  Patient is welcome to return for further diagnostic evaluation/therapeutic management at any time.      Glyn Ade, MD 10/02/22 [phone]

## 2022-10-04 DIAGNOSIS — S2249XA Multiple fractures of ribs, unspecified side, initial encounter for closed fracture: Secondary | ICD-10-CM | POA: Diagnosis not present

## 2022-10-04 DIAGNOSIS — W19XXXA Unspecified fall, initial encounter: Secondary | ICD-10-CM | POA: Diagnosis not present

## 2022-10-12 DIAGNOSIS — R338 Other retention of urine: Secondary | ICD-10-CM | POA: Diagnosis not present

## 2022-10-13 DIAGNOSIS — H40023 Open angle with borderline findings, high risk, bilateral: Secondary | ICD-10-CM | POA: Diagnosis not present

## 2022-10-13 DIAGNOSIS — H04123 Dry eye syndrome of bilateral lacrimal glands: Secondary | ICD-10-CM | POA: Diagnosis not present

## 2022-10-13 DIAGNOSIS — H43813 Vitreous degeneration, bilateral: Secondary | ICD-10-CM | POA: Diagnosis not present

## 2022-10-13 DIAGNOSIS — D3131 Benign neoplasm of right choroid: Secondary | ICD-10-CM | POA: Diagnosis not present

## 2022-11-11 DIAGNOSIS — E039 Hypothyroidism, unspecified: Secondary | ICD-10-CM | POA: Diagnosis not present

## 2022-11-12 IMAGING — CT CT HEAD W/O CM
4 series · 15 of 47 positions shown, 17 images · non-contrast
Comparison: February 19, 2021
COMPARISON: February 19, 2021

Addendum:
CLINICAL DATA: Status post trauma.



[Series 3: head without · axial · non-contrast · 0.42mm/px · z∈[-118,+2]mm · 7 of 34 slices shown, 9 images]
[im 5/34  brain]
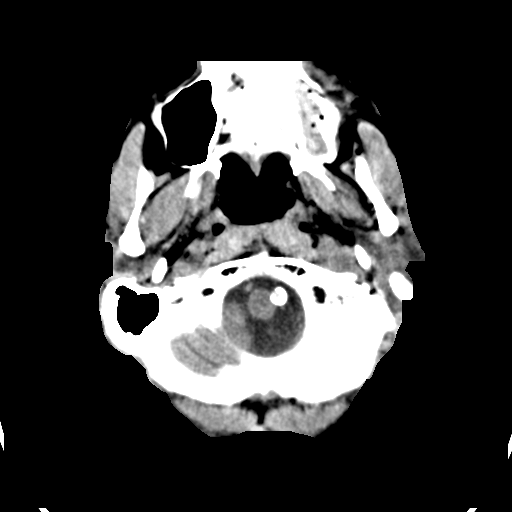
[im 5/34  bone]
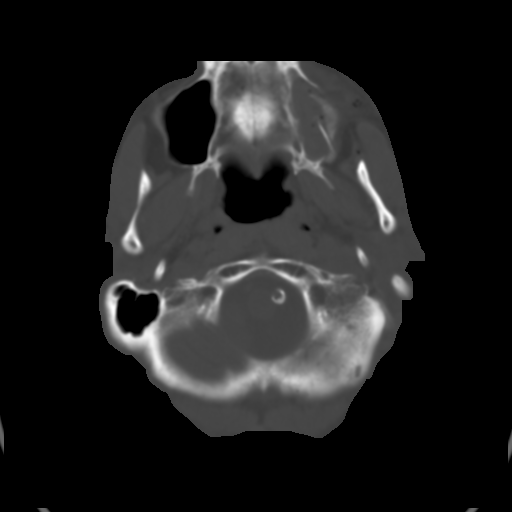
[im 9/34  brain]
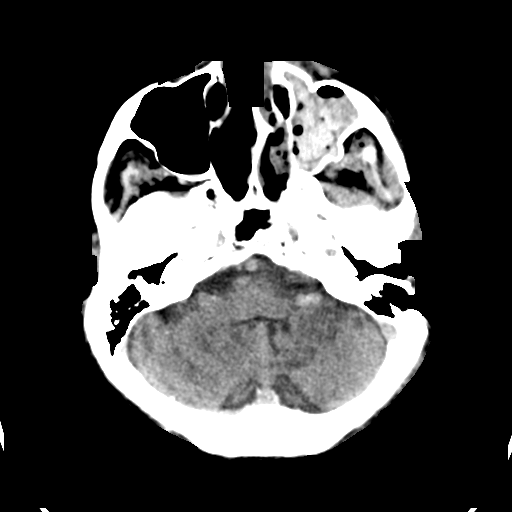
[im 13/34  brain]
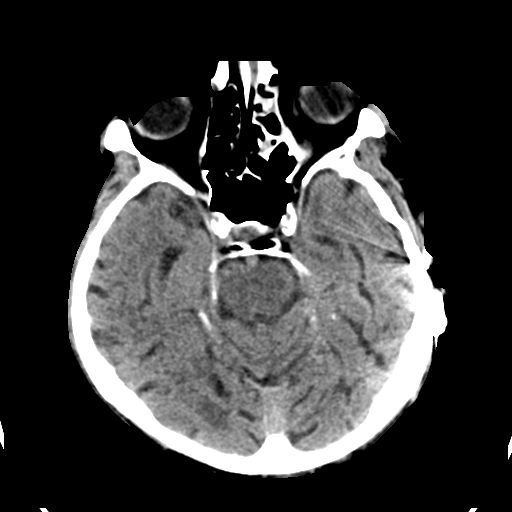
[im 17/34  brain]
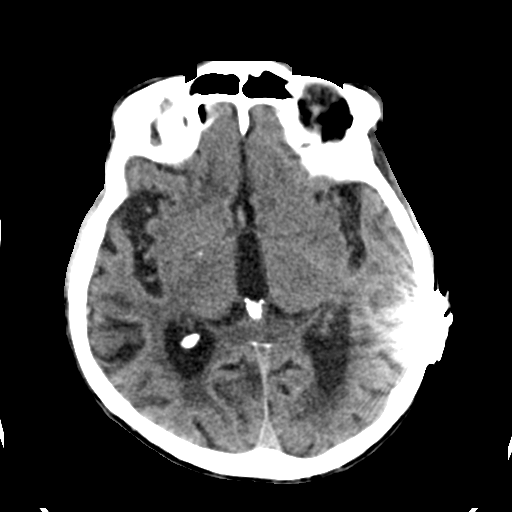
[im 21/34  brain]
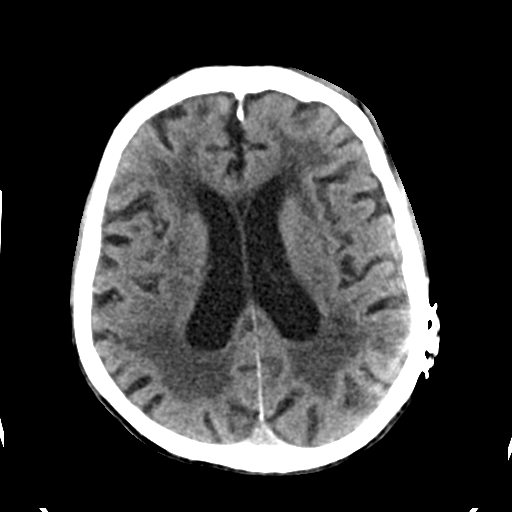
[im 21/34  bone]
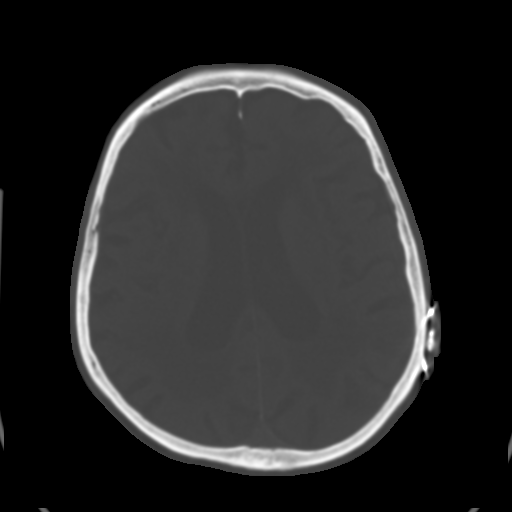
[im 25/34  brain]
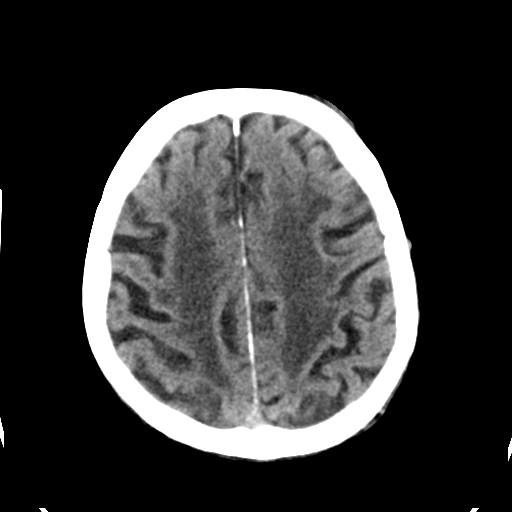
[im 29/34  brain]
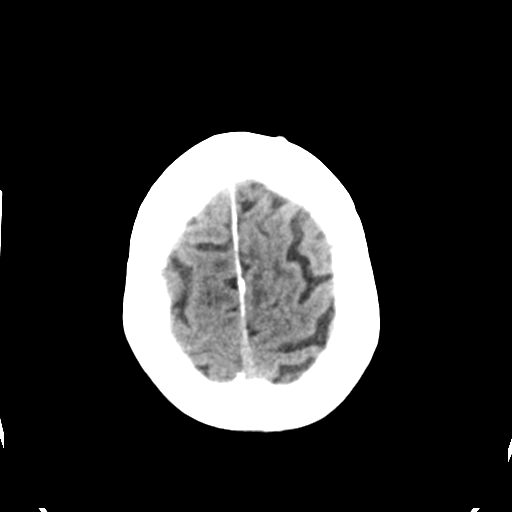

[Series 4: head bone · axial · 0.42mm/px · z∈[-122,-106]mm · 2 of 85 slices shown]
[im 9/85  bone]
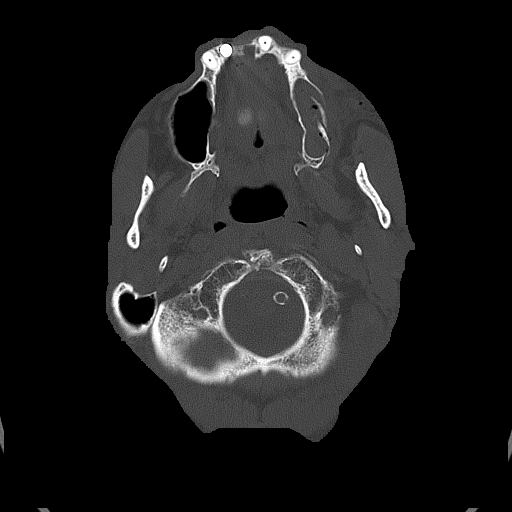
[im 17/85  bone]
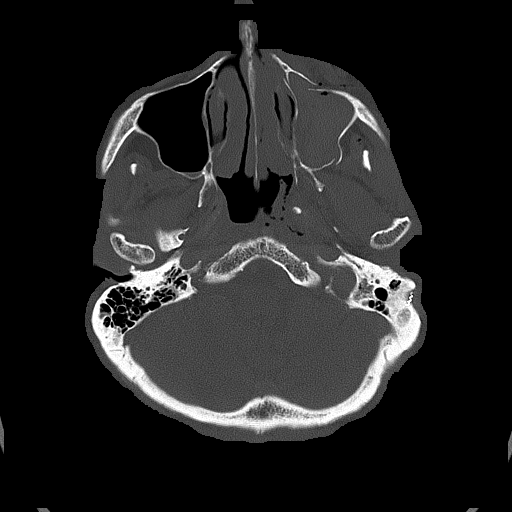

[Series 5: head without cor · coronal · non-contrast · 0.37mm/px · 3 of 71 slices shown]
[im 27/71  brain]
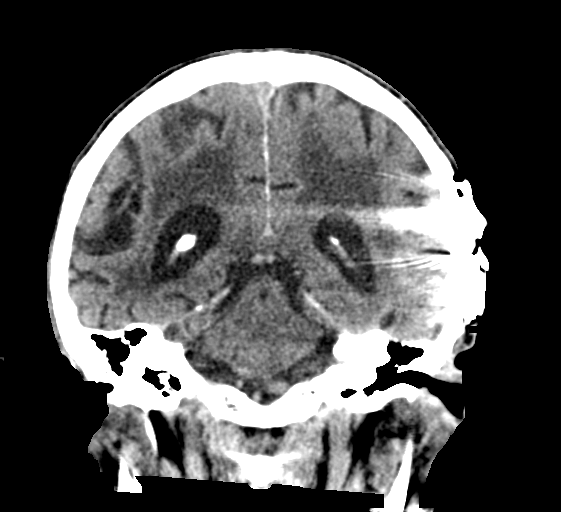
[im 33/71  brain]
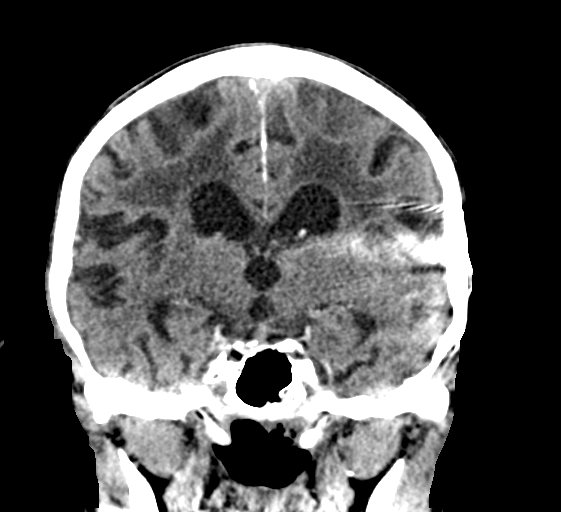
[im 38/71  brain]
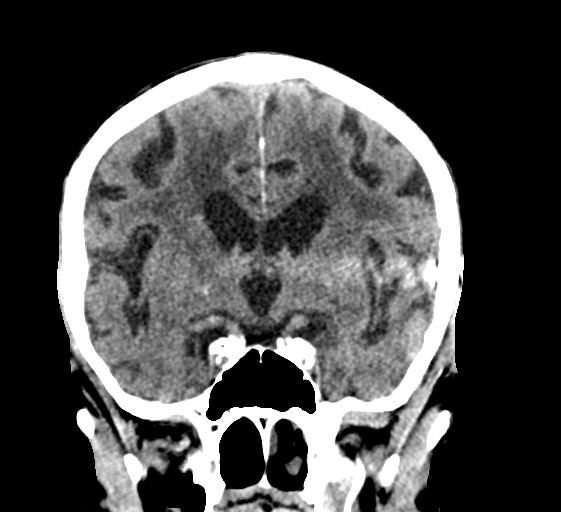

[Series 6: head without sag · sagittal · non-contrast · 0.39mm/px · 3 of 61 slices shown]
[im 21/61  brain]
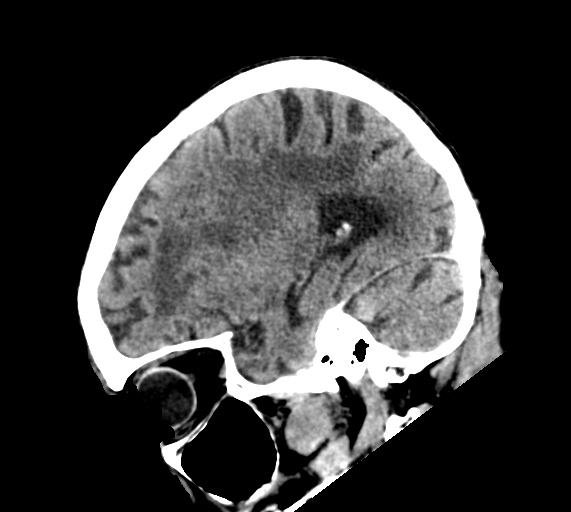
[im 31/61  brain]
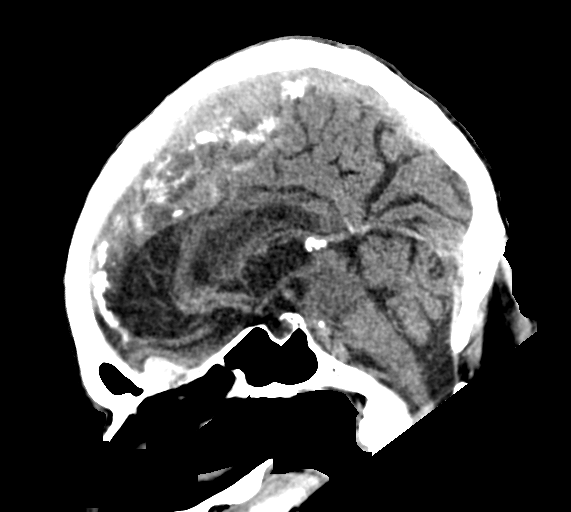
[im 41/61  brain]
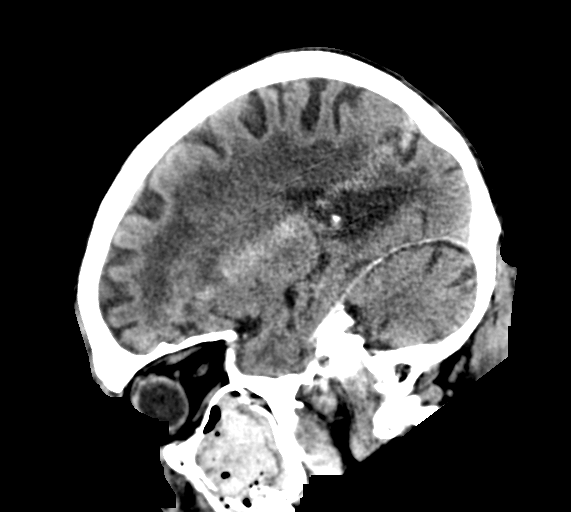

[15 of 47 positions shown; findings below may reference images not displayed]

FINDINGS: Brain: No evidence of acute infarction, hemorrhage, hydrocephalus,
extra-axial collection or mass lesion/mass effect.

Vascular: No hyperdense vessel or unexpected calcification.

Skull: There is a nondisplaced fracture of the left zygomatic arch.

Sinuses/Orbits: Marked severity left maxillary sinus, left ethmoid
sinus and left nasal mucosal thickening is seen. A hemorrhagic
component is noted.

Acute, displaced fracture deformities are seen involving the
anterior, medial and posterior walls of the left maxillary sinus. An
additional fracture of the floor of the left orbit is seen.

A left-sided cochlear implant is noted with associated streak
artifact.

Other: There is moderate severity left-sided facial, left
periorbital and left frontal scalp soft tissue swelling.
IMPRESSION: 1. No acute intracranial abnormality.
2. Acute, displaced fracture deformities of the anterior, medial and
posterior walls of the left maxillary sinus with an additional
fracture of the floor of the left orbit.
3. Marked severity left-sided facial, left periorbital and left
frontal scalp soft tissue swelling.
4. Nondisplaced fracture of the left zygomatic arch.
5. Marked severity left maxillary sinus, left ethmoid sinus and left
nasal mucosal thickening with a hemorrhagic component.
6. Left-sided cochlear implant.

ADDENDUM:
Upon further evaluation, mild to moderate severity preseptal soft
tissue swelling is seen on the left. An acute fracture of the
lateral wall of the left orbit is seen with a mild amount of
adjacent orbital emphysema.

*** End of Addendum ***
FINDINGS: Brain: No evidence of acute infarction, hemorrhage, hydrocephalus,
extra-axial collection or mass lesion/mass effect.

Vascular: No hyperdense vessel or unexpected calcification.

Skull: There is a nondisplaced fracture of the left zygomatic arch.

Sinuses/Orbits: Marked severity left maxillary sinus, left ethmoid
sinus and left nasal mucosal thickening is seen. A hemorrhagic
component is noted.

Acute, displaced fracture deformities are seen involving the
anterior, medial and posterior walls of the left maxillary sinus. An
additional fracture of the floor of the left orbit is seen.

A left-sided cochlear implant is noted with associated streak
artifact.

Other: There is moderate severity left-sided facial, left
periorbital and left frontal scalp soft tissue swelling.
IMPRESSION: 1. No acute intracranial abnormality.
2. Acute, displaced fracture deformities of the anterior, medial and
posterior walls of the left maxillary sinus with an additional
fracture of the floor of the left orbit.
3. Marked severity left-sided facial, left periorbital and left
frontal scalp soft tissue swelling.
4. Nondisplaced fracture of the left zygomatic arch.
5. Marked severity left maxillary sinus, left ethmoid sinus and left
nasal mucosal thickening with a hemorrhagic component.
6. Left-sided cochlear implant.

## 2022-11-14 DIAGNOSIS — R338 Other retention of urine: Secondary | ICD-10-CM | POA: Diagnosis not present

## 2022-12-14 DIAGNOSIS — R338 Other retention of urine: Secondary | ICD-10-CM | POA: Diagnosis not present

## 2023-01-03 ENCOUNTER — Other Ambulatory Visit: Payer: Self-pay

## 2023-01-06 ENCOUNTER — Encounter: Payer: Self-pay | Admitting: Cardiology

## 2023-01-06 ENCOUNTER — Ambulatory Visit: Payer: Medicare Other | Admitting: Cardiology

## 2023-01-06 VITALS — BP 162/56 | HR 72 | Ht 70.0 in | Wt 110.0 lb

## 2023-01-06 DIAGNOSIS — I471 Supraventricular tachycardia, unspecified: Secondary | ICD-10-CM | POA: Diagnosis not present

## 2023-01-06 DIAGNOSIS — I10 Essential (primary) hypertension: Secondary | ICD-10-CM

## 2023-01-06 DIAGNOSIS — I420 Dilated cardiomyopathy: Secondary | ICD-10-CM

## 2023-01-06 DIAGNOSIS — E1159 Type 2 diabetes mellitus with other circulatory complications: Secondary | ICD-10-CM

## 2023-01-06 NOTE — Patient Instructions (Addendum)
FDA-cleared personal EKG: The world's most clinically validated personal EKG, FDA-cleared to detect Atrial Fibrillation, Bradycardia, and Tachycardia. [employer] Knife is the most reliable way to check in on your heart from home. Take your EKG from anywhere: Capture a medical-grade EKG in 30 seconds and get an instant analysis right on your smartphone. [facility] Knife is small enough to fit in your pocket, so you can take it with you anywhere. Easy to use: Simply place your fingers on the sensors--no wires, patches, or gels. Recommended by doctors: A trusted resource, May[employer] Knife is the #1 doctor-recommended personal EKG with more than 100 million EKGs recorded. Save or share your EKGs: With the press of a button, email your EKGs to your doctor or save them on your phone. Works with smartphones: Compatible with Event organiser and tablets. Check our compatibility chart. FSA/HSA eligible: Purchase using an FSA or HSA account (please confirm coverage with your insurance provider). Phone clip included with purchase, a $15 value. Conveniently take your device with you wherever you go.  [email or web address]   Step One- Record your EKG strip on [facility] app.   Step two- On [facility] EKG click "Download"   Step three- It will prompt you to make a password for this EKG. Please make the password "Revankar" so that we can view it.   Step four- Click on the little "upload" button (small box with an arrow in the middle) in the bottom left-hand corner of the screen.   Step five- Click "Save to Files"  Step six- Click on "On my iphone" and then "Pages" then press save in the top right-hand corner.   NOW GO TO MYCHART   Once on MyChart click "Messages"  Step one- Click "Send a message"  Step two- Click "Ask a medical question"   Step three- Click "Non urgent medical question"   Step four- Click on Rajan Revankar's name.  Step five- Click on the small  paperclip at the bottom of the screen  Step six- Click "Choose file"  Step seven- Pick the most recent EKG strip listed.   Once uploaded send the message!  Medication Instructions:  Your physician recommends that you continue on your current medications as directed. Please refer to the Current Medication list given to you today.  *If you need a refill on your cardiac medications before your next appointment, please call your pharmacy*   Lab Work: Your physician recommends that you return for lab work in: the next few days for CMP, CBC and TSH. You need to have labs done when you are fasting. [facility] is located on the 3rd floor, [address]. Hours are Monday - Friday 8 am to 4 pm, closed 11:30 am to 1:00 pm. You do NOT need an appointment.   If you have labs (blood work) drawn today and your tests are completely normal, you will receive your results only by: MyChart Message (if you have MyChart) OR A paper copy in the mail If you have any lab test that is abnormal or we need to change your treatment, we will call you to review the results.   Testing/Procedures: Your physician has requested that you have an echocardiogram. Echocardiography is a painless test that uses sound waves to create images of your heart. It provides your doctor with information about the size and shape of your heart and how well your heart's chambers and valves are working. This procedure takes approximately one hour. There are no restrictions for this procedure. Please do NOT wear  cologne, perfume, aftershave, or lotions (deodorant is allowed). Please arrive 15 minutes prior to your appointment time.   Follow-Up: At [facility], you and your health needs are our priority.  As part of our continuing mission to provide you with exceptional heart care, we have created designated Provider Care Teams.  These Care Teams include your primary Cardiologist (physician) and Advanced Practice Providers (APPs -   Physician Assistants and Nurse Practitioners) who all work together to provide you with the care you need, when you need it.  We recommend signing up for the patient portal called "MyChart".  Sign up information is provided on this After Visit Summary.  MyChart is used to connect with patients for Virtual Visits (Telemedicine).  Patients are able to view lab/test results, encounter notes, upcoming appointments, etc.  Non-urgent messages can be sent to your provider as well.   To learn more about what you can do with MyChart, go to [email or web address].    Your next appointment:   6 month(s)  The format for your next appointment:   In Person  Provider:   Belva Crome, MD   Other Instructions Echocardiogram An echocardiogram is a test that uses sound waves (ultrasound) to produce images of the heart. Images from an echocardiogram can provide important information about: Heart size and shape. The size and thickness and movement of your heart's walls. Heart muscle function and strength. Heart valve function or if you have stenosis. Stenosis is when the heart valves are too narrow. If blood is flowing backward through the heart valves (regurgitation). A tumor or infectious growth around the heart valves. Areas of heart muscle that are not working well because of poor blood flow or injury from a heart attack. Aneurysm detection. An aneurysm is a weak or damaged part of an artery wall. The wall bulges out from the normal force of blood pumping through the body. Tell a health care provider about: Any allergies you have. All medicines you are taking, including vitamins, herbs, eye drops, creams, and over-the-counter medicines. Any blood disorders you have. Any surgeries you have had. Any medical conditions you have. Whether you are pregnant or may be pregnant. What are the risks? Generally, this is a safe test. However, problems may occur, including an allergic reaction to dye  (contrast) that may be used during the test. What happens before the test? No specific preparation is needed. You may eat and drink normally. What happens during the test? You will take off your clothes from the waist up and put on a hospital gown. Electrodes or electrocardiogram (ECG)patches may be placed on your chest. The electrodes or patches are then connected to a device that monitors your heart rate and rhythm. You will lie down on a table for an ultrasound exam. A gel will be applied to your chest to help sound waves pass through your skin. A handheld device, called a transducer, will be pressed against your chest and moved over your heart. The transducer produces sound waves that travel to your heart and bounce back (or "echo" back) to the transducer. These sound waves will be captured in real-time and changed into images of your heart that can be viewed on a video monitor. The images will be recorded on a computer and reviewed by your health care provider. You may be asked to change positions or hold your breath for a short time. This makes it easier to get different views or better views of your heart. In some cases, you may receive  contrast through an IV in one of your veins. This can improve the quality of the pictures from your heart. The procedure may vary among health care providers and hospitals.   What can I expect after the test? You may return to your normal, everyday life, including diet, activities, and medicines, unless your health care provider tells you not to do that. Follow these instructions at home: It is up to you to get the results of your test. Ask your health care provider, or the department that is doing the test, when your results will be ready. Keep all follow-up visits. This is important. Summary An echocardiogram is a test that uses sound waves (ultrasound) to produce images of the heart. Images from an echocardiogram can provide important information about the  size and shape of your heart, heart muscle function, heart valve function, and other possible heart problems. You do not need to do anything to prepare before this test. You may eat and drink normally. After the echocardiogram is completed, you may return to your normal, everyday life, unless your health care provider tells you not to do that. This information is not intended to replace advice given to you by your health care provider. Make sure you discuss any questions you have with your health care provider. Document Revised: 01/07/2020 Document Reviewed: 01/07/2020 Elsevier Patient Education  2021 [employer]   Important Information About Sugar

## 2023-01-06 NOTE — Progress Notes (Signed)
Cardiology Office Note:    Date:  01/06/2023   ID:  Raymond Fowler, DOB June 29, 1932, MRN [id number]  PCP:  Raymond Rua, MD  Cardiologist:  Raymond Brothers, MD   Referring MD: Raymond Rua, MD    ASSESSMENT:    1. Essential hypertension   2. Dilated cardiomyopathy (HCC)   3. Type 2 diabetes mellitus with other circulatory complications (HCC)   4. Supraventricular tachycardia    PLAN:    In order of problems listed above:  Primary prevention stressed with the patient.  Importance of compliance with diet medication stressed and patient verbalized standing.  He was advised to ambulate to the best of his ability. Essential hypertension: Blood pressure stable and lifestyle modification urged. Amiodarone therapy: We will monitor with blood work today he will have LFTs and TSH.  Benefits risks explained. Cardiomyopathy: Stable at this time.  He is an elderly gentleman and overall frail health.  We will continue to monitor.  He is on beta-blockers and ARB's. Patient will be seen in follow-up appointment in 6 months or earlier if the patient has any concerns.    Medication Adjustments/Labs and Tests Ordered: Current medicines are reviewed at length with the patient today.  Concerns regarding medicines are outlined above.  Orders Placed This Encounter  Procedures   EKG 12-Lead   No orders of the defined types were placed in this encounter.    No chief complaint on file.    History of Present Illness:    Raymond Fowler is a 89 y.o. male.  Patient has past medical history of cardiomyopathy mixed dyslipidemia and essential hypertension.  He denies any problems at this time and takes care of activities of daily living.  No chest pain orthopnea or PND.  He is accompanied by his wife.  At the time of my evaluation, the patient is alert awake oriented and in no distress.  Past Medical History:  Diagnosis Date   Bilateral hearing loss 07/29/2015   Bilateral impacted cerumen 07/29/2015    BPH (benign prostatic hyperplasia) 07/12/2021   Cardiac murmur 07/09/2021   Cerebrovascular disease 11/04/2016   Cochlear implant in place 02/24/2020   COVID-19 virus infection 07/12/2021   Diabetes mellitus without complication (HCC)    Diet-controlled diabetes mellitus (HCC) 07/09/2021   Dilated cardiomyopathy (HCC) 07/14/2021   Dizziness and giddiness 11/04/2016   Essential hypertension 07/06/2021   Frailty 07/06/2021   Hardening of the aorta (main artery of the heart) (HCC) 07/06/2021   Hypertension    Hypomagnesemia 07/12/2021   Hyponatremia 07/12/2021   Hypophosphatemia 07/13/2021   Normocytic anemia 07/12/2021   Osteoporosis 07/06/2021   Palpitations 07/09/2021   Pathological fracture of vertebra 07/06/2021   Peripheral vascular disorder due to diabetes mellitus (HCC) 08/23/2021   Protein calorie malnutrition (HCC) 07/06/2021   Protein-calorie malnutrition, severe 07/12/2021   Pure hypercholesterolemia 07/06/2021   Sensorineural hearing loss (SNHL) of both ears 02/27/2018   Subclinical hypothyroidism 07/13/2021   Supraventricular tachycardia 07/09/2021   SVT (supraventricular tachycardia) 07/11/2021   Thrombocytopenia (HCC) 07/12/2021   Type 2 diabetes mellitus with other circulatory complications (HCC) 07/06/2021   Wears hearing aid in right ear 03/10/2020    Past Surgical History:  Procedure Laterality Date   APPENDECTOMY     CHOLECYSTECTOMY     COCHLEAR IMPLANT     EYE SURGERY     FRACTURE SURGERY     HERNIA REPAIR     TONSILLECTOMY      Current Medications: Current Meds  Medication Sig  alendronate (FOSAMAX) 70 MG tablet Take 70 mg by mouth once a week.   amiodarone (PACERONE) 100 MG tablet TAKE 1 TABLET BY MOUTH DAILY IN  THE AFTERNOON   Ascorbic Acid (VITAMIN C) 1000 MG tablet Take 1,000 mg by mouth daily.   aspirin EC 325 MG tablet Take 325 mg by mouth daily.   atorvastatin (LIPITOR) 10 MG tablet Take 1 tablet (10 mg total) by mouth every evening.   brimonidine (ALPHAGAN) 0.2 %  ophthalmic solution Place 1 drop into both eyes 2 (two) times daily.   carboxymethylcellul-glycerin (REFRESH OPTIVE) 0.5-0.9 % ophthalmic solution Place 1 drop into both eyes 2 (two) times daily.   cycloSPORINE (RESTASIS) 0.05 % ophthalmic emulsion Place 1 drop into both eyes 2 (two) times daily.   finasteride (PROSCAR) 5 MG tablet Take 5 mg by mouth daily.   levothyroxine (SYNTHROID) 50 MCG tablet Take 50 mcg by mouth daily before breakfast.   liothyronine (CYTOMEL) 50 MCG tablet Take 50 mcg by mouth daily.   metoprolol succinate (TOPROL-XL) 25 MG 24 hr tablet Take 0.5 tablets (12.5 mg total) by mouth daily. Take with or immediately following a meal.   Multiple Vitamins-Minerals (MULTIVITAMIN WITH MINERALS) tablet Take 1 tablet by mouth daily.   Omega-3 Fatty Acids (FISH OIL) 1200 MG CAPS Take 1,200 mg by mouth daily.   ondansetron (ZOFRAN-ODT) 4 MG disintegrating tablet Take 1 tablet (4 mg total) by mouth every 8 (eight) hours as needed for nausea or vomiting.   valsartan (DIOVAN) 40 MG tablet Take 1 tablet (40 mg total) by mouth daily.     Allergies:   Patient has no known allergies.   Social History   Socioeconomic History   Marital status: Married    Spouse name: Alvira Philips   Number of children: 2   Years of education: Not on file   Highest education level: Not on file  Occupational History   Occupation: Retired  Tobacco Use   Smoking status: Never   Smokeless tobacco: Never  Vaping Use   Vaping status: Never Used  Substance and Sexual Activity   Alcohol use: Yes    Comment: rare   Drug use: No   Sexual activity: Not Currently  Other Topics Concern   Not on file  Social History Narrative   Lives with wife   Caffeine use: 4 cups daily   Right handed   Social Determinants of Health   Financial Resource Strain: Not on file  Food Insecurity: No Food Insecurity (06/03/2022)   Hunger Vital Sign    Worried About Running Out of Food in the Last Year: Never true    Ran Out of  Food in the Last Year: Never true  Transportation Needs: No Transportation Needs (06/03/2022)   PRAPARE - Administrator, Civil Service (Medical): No    Lack of Transportation (Non-Medical): No  Physical Activity: Not on file  Stress: Not on file  Social Connections: Not on file     Family History: The patient's family history includes Diabetes in his sister.  ROS:   Please see the history of present illness.    All other systems reviewed and are negative.  EKGs/Labs/Other Studies Reviewed:    The following studies were reviewed today: .Marland KitchenEKG Interpretation Date/Time:  Friday January 06 2023 11:21:23 EDT Ventricular Rate:  72 PR Interval:  200 QRS Duration:  92 QT Interval:  416 QTC Calculation: 455 R Axis:   -53  Text Interpretation: Sinus rhythm with occasional Premature ventricular complexes  and Premature atrial complexes Possible Left atrial enlargement Left anterior fascicular block Biventricular hypertrophy with repolarization abnormality Cannot rule out Septal infarct , age undetermined When compared with ECG of 02-Oct-2022 16:03, PREVIOUS ECG IS PRESENT Confirmed by Belva Crome [phone]) on 01/06/2023 11:47:37 AM     Recent Labs: 02/02/2022: ALT 15; TSH 11.300 10/02/2022: BUN 14; Creatinine, Ser 0.89; Hemoglobin 13.2; Platelets 229; Potassium 3.9; Sodium 126  Recent Lipid Panel    Component Value Date/Time   CHOL 146 02/02/2022 0919   TRIG 52 02/02/2022 0919   HDL 78 02/02/2022 0919   CHOLHDL 1.9 02/02/2022 0919   CHOLHDL 2.1 07/12/2021 0240   VLDL 8 07/12/2021 0240   LDLCALC 57 02/02/2022 0919    Physical Exam:    VS:  BP (!) 162/56   Pulse 72   Ht 5\' 10"  (1.778 m)   Wt 110 lb (49.9 kg)   SpO2 95%   BMI 15.78 kg/m     Wt Readings from Last 3 Encounters:  01/06/23 110 lb (49.9 kg)  10/02/22 114 lb (51.7 kg)  02/27/22 118 lb (53.5 kg)     GEN: Patient is in no acute distress HEENT: Normal NECK: No JVD; No carotid bruits LYMPHATICS: No  lymphadenopathy CARDIAC: Hear sounds regular, 2/6 systolic murmur at the apex. RESPIRATORY:  Clear to auscultation without rales, wheezing or rhonchi  ABDOMEN: Soft, non-tender, non-distended MUSCULOSKELETAL:  No edema; No deformity  SKIN: Warm and dry NEUROLOGIC:  Alert and oriented x 3 PSYCHIATRIC:  Normal affect   Signed, Raymond Brothers, MD  01/06/2023 11:46 AM    [facility]

## 2023-01-07 ENCOUNTER — Telehealth: Payer: Self-pay | Admitting: Student

## 2023-01-07 ENCOUNTER — Other Ambulatory Visit: Payer: Self-pay

## 2023-01-07 ENCOUNTER — Encounter (HOSPITAL_BASED_OUTPATIENT_CLINIC_OR_DEPARTMENT_OTHER): Payer: Self-pay

## 2023-01-07 ENCOUNTER — Emergency Department (HOSPITAL_BASED_OUTPATIENT_CLINIC_OR_DEPARTMENT_OTHER)
Admission: EM | Admit: 2023-01-07 | Discharge: 2023-01-07 | Disposition: A | Discharge status: Home / Self Care | Payer: Medicare Other | Attending: Emergency Medicine | Admitting: Emergency Medicine

## 2023-01-07 DIAGNOSIS — Z79899 Other long term (current) drug therapy: Secondary | ICD-10-CM | POA: Diagnosis not present

## 2023-01-07 DIAGNOSIS — E119 Type 2 diabetes mellitus without complications: Secondary | ICD-10-CM | POA: Insufficient documentation

## 2023-01-07 DIAGNOSIS — I10 Essential (primary) hypertension: Secondary | ICD-10-CM

## 2023-01-07 DIAGNOSIS — Z7982 Long term (current) use of aspirin: Secondary | ICD-10-CM | POA: Diagnosis not present

## 2023-01-07 LAB — CBC WITH DIFFERENTIAL/PLATELET
Abs Immature Granulocytes: 0.02 10*3/uL (ref 0.00–0.07)
Basophils Absolute: 0 10*3/uL (ref 0.0–0.1)
Basophils Relative: 0 %
Eosinophils Absolute: 0.1 10*3/uL (ref 0.0–0.5)
Eosinophils Relative: 1 %
HCT: 39.6 % (ref 39.0–52.0)
Hemoglobin: 13.7 g/dL (ref 13.0–17.0)
Immature Granulocytes: 0 %
Lymphocytes Relative: 18 %
Lymphs Abs: 1.3 10*3/uL (ref 0.7–4.0)
MCH: 30.4 pg (ref 26.0–34.0)
MCHC: 34.6 g/dL (ref 30.0–36.0)
MCV: 87.8 fL (ref 80.0–100.0)
Monocytes Absolute: 0.9 10*3/uL (ref 0.1–1.0)
Monocytes Relative: 12 %
Neutro Abs: 5 10*3/uL (ref 1.7–7.7)
Neutrophils Relative %: 69 %
Platelets: 230 10*3/uL (ref 150–400)
RBC: 4.51 MIL/uL (ref 4.22–5.81)
RDW: 13.6 % (ref 11.5–15.5)
WBC: 7.3 10*3/uL (ref 4.0–10.5)
nRBC: 0 % (ref 0.0–0.2)

## 2023-01-07 LAB — COMPREHENSIVE METABOLIC PANEL
ALT: 28 U/L (ref 0–44)
AST: 22 U/L (ref 15–41)
Albumin: 4 g/dL (ref 3.5–5.0)
Alkaline Phosphatase: 46 U/L (ref 38–126)
Anion gap: 11 (ref 5–15)
BUN: 13 mg/dL (ref 8–23)
CO2: 25 mmol/L (ref 22–32)
Calcium: 9 mg/dL (ref 8.9–10.3)
Chloride: 92 mmol/L — ABNORMAL LOW (ref 98–111)
Creatinine, Ser: 0.93 mg/dL (ref 0.61–1.24)
GFR, Estimated: >60 mL/min (ref >60)
Glucose, Bld: 102 mg/dL — ABNORMAL HIGH (ref 70–99)
Potassium: 3.9 mmol/L (ref 3.5–5.1)
Sodium: 128 mmol/L — ABNORMAL LOW (ref 135–145)
Total Bilirubin: 0.8 mg/dL (ref 0.3–1.2)
Total Protein: 6.9 g/dL (ref 6.5–8.1)

## 2023-01-07 MED ORDER — LOSARTAN POTASSIUM 25 MG PO TABS: 25.0000 mg | ORAL_TABLET | Freq: Every day | ORAL | 0 refills | Status: DC

## 2023-01-07 MED ORDER — LOSARTAN POTASSIUM 25 MG PO TABS
25.0000 mg | ORAL_TABLET | Freq: Once | ORAL | Status: AC
  Administered 2023-01-07: 25 mg via ORAL
  Filled 2023-01-07: qty 1

## 2023-01-07 NOTE — Discharge Instructions (Signed)
As we discussed, your potassium is normal.  However your blood pressure is elevated.  I will start you on losartan 25 mg daily  You need to follow-up with your primary care doctor this week to recheck your blood pressure  Return to ER if you have chest pain or shortness of breath or dizziness or headaches

## 2023-01-07 NOTE — ED Provider Notes (Signed)
[place] [facility] Provider Note   CSN: [id number] Arrival date & time: 01/07/23  1709     History  Chief Complaint  Patient presents with   Hypertension    Raymond Fowler is a 89 y.o. male history of hypertension, diabetes, dilated cardiomyopathy, SVT  here presenting with hypokalemia.  Patient states that he went to cardiology yesterday.  His blood pressure was noted to be elevated around 160s.  Patient had blood work done and potassium was 6.2.  Patient states that he has no chest pain or shortness of breath.  He states that he is only on metoprolol and not currently on any BP meds.  The history is provided by the patient.       Home Medications Prior to Admission medications   Medication Sig Start Date End Date Taking? Authorizing Provider  alendronate (FOSAMAX) 70 MG tablet Take 70 mg by mouth once a week. 06/16/21   [provider]  amiodarone (PACERONE) 100 MG tablet TAKE 1 TABLET BY MOUTH DAILY IN  THE AFTERNOON 09/27/22   Revankar, Aundra Dubin, MD  Ascorbic Acid (VITAMIN C) 1000 MG tablet Take 1,000 mg by mouth daily.    [provider]  aspirin EC 325 MG tablet Take 325 mg by mouth daily.    [provider]  atorvastatin (LIPITOR) 10 MG tablet Take 1 tablet (10 mg total) by mouth every evening. 08/31/21   Revankar, Aundra Dubin, MD  brimonidine (ALPHAGAN) 0.2 % ophthalmic solution Place 1 drop into both eyes 2 (two) times daily. 05/29/15   [provider]  carboxymethylcellul-glycerin (REFRESH OPTIVE) 0.5-0.9 % ophthalmic solution Place 1 drop into both eyes 2 (two) times daily.    [provider]  cycloSPORINE (RESTASIS) 0.05 % ophthalmic emulsion Place 1 drop into both eyes 2 (two) times daily.    [provider]  finasteride (PROSCAR) 5 MG tablet Take 5 mg by mouth daily. 04/19/21   [provider]  levothyroxine (SYNTHROID) 50 MCG tablet Take 50 mcg by mouth daily before breakfast.  10/04/22   [provider]  liothyronine (CYTOMEL) 50 MCG tablet Take 50 mcg by mouth daily.    [provider]  metoprolol succinate (TOPROL-XL) 25 MG 24 hr tablet Take 0.5 tablets (12.5 mg total) by mouth daily. Take with or immediately following a meal. 01/17/22 01/12/23  Revankar, Aundra Dubin, MD  Multiple Vitamins-Minerals (MULTIVITAMIN WITH MINERALS) tablet Take 1 tablet by mouth daily.    [provider]  Omega-3 Fatty Acids (FISH OIL) 1200 MG CAPS Take 1,200 mg by mouth daily.    [provider]  ondansetron (ZOFRAN-ODT) 4 MG disintegrating tablet Take 1 tablet (4 mg total) by mouth every 8 (eight) hours as needed for nausea or vomiting. 07/05/21   Vanetta Mulders, MD  valsartan (DIOVAN) 40 MG tablet Take 1 tablet (40 mg total) by mouth daily. 08/31/21   Revankar, Aundra Dubin, MD      Allergies    Patient has no known allergies.    Review of Systems   Review of Systems  Respiratory:  Negative for shortness of breath.   Cardiovascular:  Negative for chest pain.  All other systems reviewed and are negative.   Physical Exam Updated Vital Signs BP (!) 177/118   Pulse 71   Temp 97.6 F (36.4 C) (Oral)   Resp 18   Ht 5\' 10"  (1.778 m)   Wt 49.9 kg   SpO2 98%   BMI 15.78 kg/m  Physical Exam Vitals and nursing note reviewed.  Constitutional:      Comments: Chronically ill but not acutely ill  HENT:     Nose: Nose normal.  Eyes:     Extraocular Movements: Extraocular movements intact.     Pupils: Pupils are equal, round, and reactive to light.  Cardiovascular:     Rate and Rhythm: Normal rate and regular rhythm.     Pulses: Normal pulses.     Heart sounds: Normal heart sounds.  Pulmonary:     Effort: Pulmonary effort is normal.     Breath sounds: Normal breath sounds.  Abdominal:     General: Abdomen is flat.     Palpations: Abdomen is soft.  Musculoskeletal:        General: Normal range of motion.     Cervical back: Normal range of motion and  neck supple.  Skin:    General: Skin is warm.     Capillary Refill: Capillary refill takes less than 2 seconds.  Neurological:     General: No focal deficit present.     Comments: Demented and moving all extremities  Psychiatric:        Mood and Affect: Mood normal.        Behavior: Behavior normal.     ED Results / Procedures / Treatments   Labs (all labs ordered are listed, but only abnormal results are displayed) Labs Reviewed  COMPREHENSIVE METABOLIC PANEL - Abnormal; Notable for the following components:      Result Value   Sodium 128 (*)    Chloride 92 (*)    Glucose, Bld 102 (*)    All other components within normal limits  CBC WITH DIFFERENTIAL/PLATELET    EKG None  Radiology No results found.  Procedures Procedures    Medications Ordered in ED Medications  losartan (COZAAR) tablet 25 mg (has no administration in time range)    ED Course/ Medical Decision Making/ A&P                                 Medical Decision Making Raymond Fowler is a 89 y.o. male here presenting with possible hyperkalemia.  I reviewed labs from yesterday and I think likely hemolysis.  Recheck labs today show potassium of 3.9.  His sodium is slightly low at 128.  His blood pressure persistently elevated 170s to 190.  Patient is not on BP meds.  Reviewed cardiology notes from yesterday.  Since blood pressure is persistently above 160, I wonder start low-dose losartan.  I recommend that patient follows up with cardiology or PCP this week   Problems Addressed: Hypertension, unspecified type: chronic illness or injury with exacerbation, progression, or side effects of treatment  Amount and/or Complexity of Data Reviewed Labs: ordered. Decision-making details documented in ED Course.  Risk Prescription drug management.    Final Clinical Impression(s) / ED Diagnoses Final diagnoses:  None    Rx / DC Orders ED Discharge Orders     None         Charlynne Pander,  MD 01/07/23 [phone]

## 2023-01-07 NOTE — Telephone Encounter (Signed)
   Received page from [facility] about a critical lab results. Potassium 6.2 on labs drawn in the office today. Called and spoke with wife (OK per DPR). Recommended going to the ED to get this rechecked. Wife voiced understanding and agreed. She will take him.  Of note, patient is on Valsartan and has already taken this today. If potassium level is indeed high, this will need to be stopped. If this is just a lab error, can be continued.  Corrin Parker, PA-C 01/07/2023 4:24 PM

## 2023-01-07 NOTE — ED Triage Notes (Signed)
The patient had routine lab work completed and his provider called and told him to come to the ER due to his potassium being high. Patients blood pressure is also elevated.

## 2023-01-16 DIAGNOSIS — R338 Other retention of urine: Secondary | ICD-10-CM | POA: Diagnosis not present

## 2023-01-26 ENCOUNTER — Other Ambulatory Visit: Payer: Self-pay | Admitting: Cardiology

## 2023-01-26 DIAGNOSIS — I471 Supraventricular tachycardia, unspecified: Secondary | ICD-10-CM

## 2023-01-26 DIAGNOSIS — I10 Essential (primary) hypertension: Secondary | ICD-10-CM

## 2023-01-26 DIAGNOSIS — I420 Dilated cardiomyopathy: Secondary | ICD-10-CM

## 2023-01-26 DIAGNOSIS — E1159 Type 2 diabetes mellitus with other circulatory complications: Secondary | ICD-10-CM

## 2023-02-01 DIAGNOSIS — Z23 Encounter for immunization: Secondary | ICD-10-CM | POA: Diagnosis not present

## 2023-02-01 DIAGNOSIS — E46 Unspecified protein-calorie malnutrition: Secondary | ICD-10-CM | POA: Diagnosis not present

## 2023-02-01 DIAGNOSIS — E1159 Type 2 diabetes mellitus with other circulatory complications: Secondary | ICD-10-CM | POA: Diagnosis not present

## 2023-02-01 DIAGNOSIS — E7800 Pure hypercholesterolemia, unspecified: Secondary | ICD-10-CM | POA: Diagnosis not present

## 2023-02-01 DIAGNOSIS — I10 Essential (primary) hypertension: Secondary | ICD-10-CM | POA: Diagnosis not present

## 2023-02-01 DIAGNOSIS — I700 Atherosclerosis of aorta: Secondary | ICD-10-CM | POA: Diagnosis not present

## 2023-02-01 DIAGNOSIS — E039 Hypothyroidism, unspecified: Secondary | ICD-10-CM | POA: Diagnosis not present

## 2023-02-02 ENCOUNTER — Ambulatory Visit (HOSPITAL_BASED_OUTPATIENT_CLINIC_OR_DEPARTMENT_OTHER)
Admission: RE | Admit: 2023-02-02 | Discharge: 2023-02-02 | Disposition: A | Discharge status: Home / Self Care | Payer: Medicare Other | Source: Ambulatory Visit | Attending: Cardiology | Admitting: Cardiology

## 2023-02-02 DIAGNOSIS — I471 Supraventricular tachycardia, unspecified: Secondary | ICD-10-CM | POA: Insufficient documentation

## 2023-02-02 DIAGNOSIS — I420 Dilated cardiomyopathy: Secondary | ICD-10-CM | POA: Diagnosis not present

## 2023-02-02 LAB — ECHOCARDIOGRAM COMPLETE
AR max vel: 1.81 cm2
AV Area VTI: 1.76 cm2
AV Area mean vel: 1.78 cm2
AV Mean grad: 4 mmHg
AV Peak grad: 8.1 mmHg
AV Vena cont: 0.3 cm
Ao pk vel: 1.42 m/s
Area-P 1/2: 2.16 cm2
Calc EF: 37.7 %
MV M vel: 4.52 m/s
MV Peak grad: 81.7 mmHg
P 1/2 time: 571 ms
S' Lateral: 3.5 cm
Single Plane A2C EF: 36.2 %
Single Plane A4C EF: 39.9 %

## 2023-02-09 ENCOUNTER — Telehealth: Payer: Self-pay

## 2023-02-09 MED ORDER — SACUBITRIL-VALSARTAN 24-26 MG PO TABS: 1.0000 | ORAL_TABLET | Freq: Two times a day (BID) | ORAL | 3 refills | Status: DC

## 2023-02-09 NOTE — Telephone Encounter (Signed)
Results reviewed with Alvira Philips as per Dr. Kem Parkinson note. Alvira Philips verbalized understanding and had no additional questions. Routed to PCP.

## 2023-02-09 NOTE — Telephone Encounter (Signed)
-----   Message from Aundra Dubin Revankar sent at 02/08/2023 11:57 AM EDT ----- Medical management.  Please see if he is willing to switch medications to [facility].  Copy primary care.  Ejection fraction moderately depressed.  Not much to worry about. [name], MD 02/08/2023 11:57 AM

## 2023-02-09 NOTE — Telephone Encounter (Signed)
-----   Message from Raymond Fowler sent at 02/09/2023  3:31 PM EDT ----- Correct ----- Message ----- From: [name], RN Sent: 02/09/2023   3:27 PM EDT To: [name], MD  Pt is also on Valsartan, I assume stop it as well. ----- Message ----- From: [name], MD Sent: 02/09/2023  11:44 AM EDT To: [name], RN  So let us stop losartan and start low-dose Entresto per protocol.  Get him back in 1 week for Chem-7 and blood pressure check.  Let him keep a log of blood pressures also. ----- Message ----- From: [name], RN Sent: 02/09/2023  11:29 AM EDT To: [name], MD  Pt was on Entresto in 2023 and looks like we took him off because of his BP. Pt's wife states that his BP has recently been elevated and was started on Losartan. She is willing to try Entresto. ----- Message ----- From: [name], MD Sent: 02/08/2023  11:57 AM EDT To: [name], RN  Medical management.  Please see if he is willing to switch medications to [facility].  Copy primary care.  Ejection fraction moderately depressed.  Not much to worry about. [name], MD 02/08/2023 11:57 AM

## 2023-02-09 NOTE — Telephone Encounter (Signed)
Left vm to call back

## 2023-02-16 DIAGNOSIS — R338 Other retention of urine: Secondary | ICD-10-CM | POA: Diagnosis not present

## 2023-02-27 ENCOUNTER — Other Ambulatory Visit: Payer: Self-pay | Admitting: Cardiology

## 2023-03-06 ENCOUNTER — Encounter: Payer: Self-pay | Admitting: Cardiology

## 2023-03-10 DIAGNOSIS — Z23 Encounter for immunization: Secondary | ICD-10-CM | POA: Diagnosis not present

## 2023-03-16 ENCOUNTER — Emergency Department (HOSPITAL_COMMUNITY): Payer: Medicare Other

## 2023-03-16 ENCOUNTER — Emergency Department (HOSPITAL_COMMUNITY)
Admission: EM | Admit: 2023-03-16 | Discharge: 2023-03-16 | Disposition: A | Discharge status: Home / Self Care | Payer: Medicare Other | Attending: Emergency Medicine | Admitting: Emergency Medicine

## 2023-03-16 DIAGNOSIS — R109 Unspecified abdominal pain: Secondary | ICD-10-CM | POA: Insufficient documentation

## 2023-03-16 DIAGNOSIS — N400 Enlarged prostate without lower urinary tract symptoms: Secondary | ICD-10-CM | POA: Diagnosis not present

## 2023-03-16 DIAGNOSIS — N320 Bladder-neck obstruction: Secondary | ICD-10-CM | POA: Diagnosis not present

## 2023-03-16 DIAGNOSIS — R8290 Unspecified abnormal findings in urine: Secondary | ICD-10-CM | POA: Insufficient documentation

## 2023-03-16 DIAGNOSIS — R9082 White matter disease, unspecified: Secondary | ICD-10-CM | POA: Diagnosis not present

## 2023-03-16 DIAGNOSIS — R55 Syncope and collapse: Secondary | ICD-10-CM | POA: Diagnosis not present

## 2023-03-16 DIAGNOSIS — I10 Essential (primary) hypertension: Secondary | ICD-10-CM | POA: Diagnosis not present

## 2023-03-16 DIAGNOSIS — G319 Degenerative disease of nervous system, unspecified: Secondary | ICD-10-CM | POA: Insufficient documentation

## 2023-03-16 DIAGNOSIS — K5900 Constipation, unspecified: Secondary | ICD-10-CM | POA: Diagnosis not present

## 2023-03-16 DIAGNOSIS — R1084 Generalized abdominal pain: Secondary | ICD-10-CM | POA: Diagnosis not present

## 2023-03-16 DIAGNOSIS — Z7982 Long term (current) use of aspirin: Secondary | ICD-10-CM | POA: Diagnosis not present

## 2023-03-16 DIAGNOSIS — N200 Calculus of kidney: Secondary | ICD-10-CM | POA: Diagnosis not present

## 2023-03-16 DIAGNOSIS — K5730 Diverticulosis of large intestine without perforation or abscess without bleeding: Secondary | ICD-10-CM | POA: Diagnosis not present

## 2023-03-16 DIAGNOSIS — I6782 Cerebral ischemia: Secondary | ICD-10-CM | POA: Diagnosis not present

## 2023-03-16 LAB — URINALYSIS, W/ REFLEX TO CULTURE (INFECTION SUSPECTED)
Bilirubin Urine: NEGATIVE
Glucose, UA: NEGATIVE mg/dL
Ketones, ur: NEGATIVE mg/dL
Nitrite: NEGATIVE
Protein, ur: 30 mg/dL — AB
Specific Gravity, Urine: 1.011 (ref 1.005–1.030)
pH: 8 (ref 5.0–8.0)

## 2023-03-16 LAB — COMPREHENSIVE METABOLIC PANEL
ALT: 21 U/L (ref 0–44)
AST: 23 U/L (ref 15–41)
Albumin: 4 g/dL (ref 3.5–5.0)
Alkaline Phosphatase: 45 U/L (ref 38–126)
Anion gap: 10 (ref 5–15)
BUN: 13 mg/dL (ref 8–23)
CO2: 23 mmol/L (ref 22–32)
Calcium: 9 mg/dL (ref 8.9–10.3)
Chloride: 98 mmol/L (ref 98–111)
Creatinine, Ser: 1.12 mg/dL (ref 0.61–1.24)
GFR, Estimated: >60 mL/min (ref >60)
Glucose, Bld: 153 mg/dL — ABNORMAL HIGH (ref 70–99)
Potassium: 4.3 mmol/L (ref 3.5–5.1)
Sodium: 131 mmol/L — ABNORMAL LOW (ref 135–145)
Total Bilirubin: 0.9 mg/dL (ref 0.3–1.2)
Total Protein: 6.9 g/dL (ref 6.5–8.1)

## 2023-03-16 LAB — CBC WITH DIFFERENTIAL/PLATELET
Abs Immature Granulocytes: 0.05 10*3/uL (ref 0.00–0.07)
Basophils Absolute: 0 10*3/uL (ref 0.0–0.1)
Basophils Relative: 0 %
Eosinophils Absolute: 0 10*3/uL (ref 0.0–0.5)
Eosinophils Relative: 0 %
HCT: 40.8 % (ref 39.0–52.0)
Hemoglobin: 13.9 g/dL (ref 13.0–17.0)
Immature Granulocytes: 1 %
Lymphocytes Relative: 8 %
Lymphs Abs: 0.9 10*3/uL (ref 0.7–4.0)
MCH: 29.8 pg (ref 26.0–34.0)
MCHC: 34.1 g/dL (ref 30.0–36.0)
MCV: 87.6 fL (ref 80.0–100.0)
Monocytes Absolute: 0.7 10*3/uL (ref 0.1–1.0)
Monocytes Relative: 6 %
Neutro Abs: 9.3 10*3/uL — ABNORMAL HIGH (ref 1.7–7.7)
Neutrophils Relative %: 85 %
Platelets: 250 10*3/uL (ref 150–400)
RBC: 4.66 MIL/uL (ref 4.22–5.81)
RDW: 13.9 % (ref 11.5–15.5)
WBC: 10.9 10*3/uL — ABNORMAL HIGH (ref 4.0–10.5)
nRBC: 0 % (ref 0.0–0.2)

## 2023-03-16 LAB — TROPONIN I (HIGH SENSITIVITY)
Troponin I (High Sensitivity): 9 ng/L (ref <18)
Troponin I (High Sensitivity): 18 ng/L — ABNORMAL HIGH (ref <18)

## 2023-03-16 MED ORDER — POLYETHYLENE GLYCOL 3350 17 G PO PACK: 17.0000 g | PACK | Freq: Every day | ORAL | 0 refills | Status: DC

## 2023-03-16 MED ORDER — FLEET ENEMA RE ENEM
1.0000 | ENEMA | Freq: Once | RECTAL | Status: AC
  Administered 2023-03-16: 1 via RECTAL
  Filled 2023-03-16: qty 1

## 2023-03-16 MED ORDER — POLYETHYLENE GLYCOL 3350 17 G PO PACK: 17.0000 g | PACK | Freq: Every day | ORAL | 0 refills | Status: AC

## 2023-03-16 MED ORDER — IOHEXOL 350 MG/ML SOLN
50.0000 mL | Freq: Once | INTRAVENOUS | Status: AC | PRN
  Administered 2023-03-16: 50 mL via INTRAVENOUS

## 2023-03-16 NOTE — ED Triage Notes (Signed)
Pt to ED via [facility] from home. Pt lives at home with wife who is pt's main caregiver. Pt states he has not had a BM in 1 week. Pt has tried laxatives at home. Pt had episode of small diarrhea today and while on toilet pt had syncopal episode. Pt did not fall or injury himself. Wife reports LOC for ~30 seconds. Pt c/o generalized abd pain. Pt denies CP, SOB, dizziness, N/V.    EMS:  157/80 80 HR 99% RA 203 CBG

## 2023-03-16 NOTE — ED Notes (Signed)
HR90  Bp 162/114   SPO 82

## 2023-03-16 NOTE — ED Provider Notes (Signed)
[place] [facility] AT [facility] Provider Note   CSN: [id number] Arrival date & time: 03/16/23  1133     History  Chief Complaint  Patient presents with   Loss of Consciousness   Constipation    Raymond Fowler is a 89 y.o. male.  89 year old male with prior medical history as detailed below presents for home with EMS transport.  Patient reportedly has had no significant BM x 1 week.  Patient has had several looser stools over the last day but no apparent significant bowel movement production.  Today patient was on the toilet and then had a brief syncope.  Patient reportedly had LOC for roughly 30 seconds.  On my evaluation the patient appears to be comfortable.  He denies chest pain, shortness of breath, dizziness, nausea, vomiting, other complaint.  The history is provided by the patient, medical records and the EMS personnel.       Home Medications Prior to Admission medications   Medication Sig Start Date End Date Taking? Authorizing Provider  alendronate (FOSAMAX) 70 MG tablet Take 70 mg by mouth once a week. 06/16/21   [provider]  amiodarone (PACERONE) 100 MG tablet Take 1 tablet (100 mg total) by mouth daily. 02/27/23   Revankar, Aundra Dubin, MD  Ascorbic Acid (VITAMIN C) 1000 MG tablet Take 1,000 mg by mouth daily.    [provider]  aspirin EC 325 MG tablet Take 325 mg by mouth daily.    [provider]  atorvastatin (LIPITOR) 10 MG tablet Take 1 tablet (10 mg total) by mouth every evening. 08/31/21   Revankar, Aundra Dubin, MD  brimonidine (ALPHAGAN) 0.2 % ophthalmic solution Place 1 drop into both eyes 2 (two) times daily. 05/29/15   [provider]  carboxymethylcellul-glycerin (REFRESH OPTIVE) 0.5-0.9 % ophthalmic solution Place 1 drop into both eyes 2 (two) times daily.    [provider]  cycloSPORINE (RESTASIS) 0.05 % ophthalmic emulsion Place 1 drop into both eyes 2 (two) times daily.    [provider]  finasteride (PROSCAR) 5 MG tablet Take 5 mg by mouth daily. 04/19/21   [provider]  levothyroxine (SYNTHROID) 50 MCG tablet Take 50 mcg by mouth daily before breakfast. 10/04/22   [provider]  liothyronine (CYTOMEL) 50 MCG tablet Take 50 mcg by mouth daily.    [provider]  metoprolol succinate (TOPROL-XL) 25 MG 24 hr tablet Take 0.5 tablets (12.5 mg total) by mouth daily. Take with or immediately following a meal. 01/17/22 01/12/23  Revankar, Aundra Dubin, MD  Multiple Vitamins-Minerals (MULTIVITAMIN WITH MINERALS) tablet Take 1 tablet by mouth daily.    [provider]  Omega-3 Fatty Acids (FISH OIL) 1200 MG CAPS Take 1,200 mg by mouth daily.    [provider]  ondansetron (ZOFRAN-ODT) 4 MG disintegrating tablet Take 1 tablet (4 mg total) by mouth every 8 (eight) hours as needed for nausea or vomiting. 07/05/21   Vanetta Mulders, MD  sacubitril-valsartan (ENTRESTO) 24-26 MG Take 1 tablet by mouth 2 (two) times daily. 02/09/23   Revankar, Aundra Dubin, MD      Allergies    Patient has no known allergies.    Review of Systems   Review of Systems  All other systems reviewed and are negative.   Physical Exam Updated Vital Signs BP (!) 186/85 (BP Location: Right Arm)   Pulse 76   Temp 97.7 F (36.5 C) (Axillary)   Resp 16   SpO2 100%  Physical Exam Vitals and nursing note reviewed.  Constitutional:      General: He is not in acute distress.    Appearance: Normal appearance. He is well-developed.  HENT:     Head: Normocephalic and atraumatic.  Eyes:     Conjunctiva/sclera: Conjunctivae normal.     Pupils: Pupils are equal, round, and reactive to light.  Cardiovascular:     Rate and Rhythm: Regular rhythm.     Heart sounds: Normal heart sounds.  Pulmonary:     Effort: Pulmonary effort is normal. No respiratory distress.     Breath sounds: Normal breath sounds.  Abdominal:     General: There is no distension.      Palpations: Abdomen is soft.     Tenderness: There is no abdominal tenderness.  Musculoskeletal:        General: No deformity. Normal range of motion.     Cervical back: Normal range of motion and neck supple.  Skin:    General: Skin is warm and dry.  Neurological:     General: No focal deficit present.     Mental Status: He is alert and oriented to person, place, and time.     ED Results / Procedures / Treatments   Labs (all labs ordered are listed, but only abnormal results are displayed) Labs Reviewed  COMPREHENSIVE METABOLIC PANEL  CBC WITH DIFFERENTIAL/PLATELET  URINALYSIS, W/ REFLEX TO CULTURE (INFECTION SUSPECTED)  TROPONIN I (HIGH SENSITIVITY)    EKG None  Radiology No results found.  Procedures Procedures    Medications Ordered in ED Medications - No data to display  ED Course/ Medical Decision Making/ A&P                                 Medical Decision Making Amount and/or Complexity of Data Reviewed Labs: ordered. Radiology: ordered.  Risk Prescription drug management.    Medical Screen Complete  This patient presented to the ED with complaint of constipation, syncope.  This complaint involves an extensive number of treatment options. The initial differential diagnosis includes, but is not limited to, constipation, metabolic abnormality, intra-abdominal pathology such as AAA, arrhythmia, ACS  This presentation is: Acute, Self-Limited, Previously Undiagnosed, Uncertain Prognosis, Complicated, Systemic Symptoms, and Threat to Life/Bodily Function  Patient with reported constipation x 1 week.  Now presents after brief syncopal event while straining on the toilet.  Screening labs and imaging pending.  Oncoming EDP is aware of case.      Additional history obtained: External records from outside sources obtained and reviewed including prior ED visits and prior Inpatient records.    Lab Tests:  I ordered and personally interpreted labs.   The pertinent results include: CBC, CMP, troponin, UA   Imaging Studies ordered:  I ordered imaging studies including CT head, CT abdomen pelvis  I agree with the radiologist interpretation.   Cardiac Monitoring:  The patient was maintained on a cardiac monitor.  I personally viewed and interpreted the cardiac monitor which showed an underlying rhythm of: NSR  Problem List / ED Course:  Syncope, suspected constipation   Reevaluation:  After the interventions noted above, I reevaluated the patient and found that they have: stayed the same  Disposition:  After consideration of the diagnostic results and the patients response to treatment, I feel that the patent would benefit from completion of ED evaluation.          Final Clinical Impression(s) /  ED Diagnoses Final diagnoses:  Syncope, unspecified syncope type  Constipation, unspecified constipation type    Rx / DC Orders ED Discharge Orders     None         Wynetta Fines, MD 03/16/23 1622

## 2023-03-16 NOTE — ED Notes (Signed)
HR93  Bp 169/141    SPO90

## 2023-03-16 NOTE — ED Provider Notes (Addendum)
4:03 PM Care assumed from Dr. Rodena Medin.  At time of transfer of care, patient is awaiting for results of CT head and CT on pelvis to determine etiology of patient's decreased bowel movement and syncopal episode.  Plan of care is to reassess based on findings to determine decision.  5:22 PM CTs returned without acute abnormalities.  CT head showed no acute changes and chronic changes seen.  Abdomen pelvis did not show obstruction but did show a large amount of stool in the rectum.  I spoke with the patient offering manual disimpaction versus enema and they will like to try enema first.  This was ordered.  Anticipate disposition after patient starts to move his bowels.  Enema unsuccessful, will perform disimpaction.  10:11 PM Patient successfully disimpacted at the bedside with nursing chaperoning.  After disimpaction, patient will be discharged for outpatient follow-up.  Patient and family agreed to have them start MiraLAX to keep bowels moving and follow-up with his primary doctor.  Patient be discharged for outpatient follow-up.   Clinical Impression: 1. Syncope, unspecified syncope type   2. Constipation, unspecified constipation type     Disposition: Discharge  Condition: Good  I have discussed the results, Dx and Tx plan with the pt(& family if present). He/she/they expressed understanding and agree(s) with the plan. Discharge instructions discussed at great length. Strict return precautions discussed and pt &/or family have verbalized understanding of the instructions. No further questions at time of discharge.    New Prescriptions   POLYETHYLENE GLYCOL (MIRALAX) 17 G PACKET    Take 17 g by mouth daily.    Follow Up: Joycelyn Rua, MD [address] [place] [address] [phone]     [facility] Emergency Department at [facility] [address] [place] [address] [phone]           Nixon Sparr, Canary Brim, MD 03/16/23  2213   Fecal disimpaction  Date/Time: 03/16/2023 10:13 PM  Performed by: Heide Scales, MD Authorized by: Heide Scales, MD  Consent: Verbal consent obtained. Risks and benefits: risks, benefits and alternatives were discussed Consent given by: patient Imaging studies: imaging studies available Patient identity confirmed: verbally with patient Time out: Immediately prior to procedure a "time out" was called to verify the correct patient, procedure, equipment, support staff and site/side marked as required. Local anesthesia used: no  Anesthesia: Local anesthesia used: no  Sedation: Patient sedated: no  Patient tolerance: patient tolerated the procedure well with no immediate complications       Aryka Coonradt, Canary Brim, MD 03/16/23 2213

## 2023-03-16 NOTE — ED Notes (Signed)
Patient transported to CT 

## 2023-03-16 NOTE — ED Notes (Signed)
PT passing liquid stool and saying he needs to go more but can't. Pt cleaned, bedding changed.

## 2023-03-16 NOTE — Discharge Instructions (Addendum)
Your history, exam, workup today did not reveal significant intra-abdominal pathology other than the constipation and stool impaction that we discovered.  We were able to manually disimpact you so please use the MiraLAX for the next few days to help keep your bowels moving and stay hydrated.  Please follow-up with your primary doctor.  The rest of your workup was overall reassuring and we feel you are safe for discharge home now.  If any symptoms change or worsen acutely, please return to the nearest emergency department

## 2023-03-17 LAB — URINE CULTURE: Culture: >=100000 — AB

## 2023-03-19 NOTE — Plan of Care (Signed)
[facility] Tonsillectomy/Adenoidectomy, Postoperative PEDS care plan entered in error.

## 2023-03-20 DIAGNOSIS — R338 Other retention of urine: Secondary | ICD-10-CM | POA: Diagnosis not present

## 2023-03-30 DIAGNOSIS — Z0000 Encounter for general adult medical examination without abnormal findings: Secondary | ICD-10-CM | POA: Diagnosis not present

## 2023-03-30 DIAGNOSIS — Z9181 History of falling: Secondary | ICD-10-CM | POA: Diagnosis not present

## 2023-03-30 DIAGNOSIS — E46 Unspecified protein-calorie malnutrition: Secondary | ICD-10-CM | POA: Diagnosis not present

## 2023-03-30 DIAGNOSIS — I10 Essential (primary) hypertension: Secondary | ICD-10-CM | POA: Diagnosis not present

## 2023-04-17 DIAGNOSIS — N319 Neuromuscular dysfunction of bladder, unspecified: Secondary | ICD-10-CM | POA: Diagnosis not present

## 2023-04-17 DIAGNOSIS — R338 Other retention of urine: Secondary | ICD-10-CM | POA: Diagnosis not present

## 2023-04-20 DIAGNOSIS — D3131 Benign neoplasm of right choroid: Secondary | ICD-10-CM | POA: Diagnosis not present

## 2023-04-20 DIAGNOSIS — H40023 Open angle with borderline findings, high risk, bilateral: Secondary | ICD-10-CM | POA: Diagnosis not present

## 2023-05-11 ENCOUNTER — Other Ambulatory Visit: Payer: Self-pay

## 2023-05-11 ENCOUNTER — Emergency Department (HOSPITAL_BASED_OUTPATIENT_CLINIC_OR_DEPARTMENT_OTHER): Payer: Medicare Other

## 2023-05-11 ENCOUNTER — Encounter (HOSPITAL_BASED_OUTPATIENT_CLINIC_OR_DEPARTMENT_OTHER): Payer: Self-pay | Admitting: Urology

## 2023-05-11 ENCOUNTER — Inpatient Hospital Stay (HOSPITAL_BASED_OUTPATIENT_CLINIC_OR_DEPARTMENT_OTHER)
Admission: EM | Admit: 2023-05-11 | Discharge: 2023-05-16 | DRG: 690 | Disposition: A | Discharge status: Home / Self Care | Payer: Medicare Other | Attending: Family Medicine | Admitting: Family Medicine

## 2023-05-11 DIAGNOSIS — Z9621 Cochlear implant status: Secondary | ICD-10-CM | POA: Diagnosis present

## 2023-05-11 DIAGNOSIS — D649 Anemia, unspecified: Secondary | ICD-10-CM | POA: Diagnosis not present

## 2023-05-11 DIAGNOSIS — J9811 Atelectasis: Secondary | ICD-10-CM | POA: Diagnosis present

## 2023-05-11 DIAGNOSIS — E441 Mild protein-calorie malnutrition: Secondary | ICD-10-CM | POA: Diagnosis not present

## 2023-05-11 DIAGNOSIS — I471 Supraventricular tachycardia, unspecified: Secondary | ICD-10-CM | POA: Diagnosis not present

## 2023-05-11 DIAGNOSIS — I420 Dilated cardiomyopathy: Secondary | ICD-10-CM | POA: Diagnosis not present

## 2023-05-11 DIAGNOSIS — R6510 Systemic inflammatory response syndrome (SIRS) of non-infectious origin without acute organ dysfunction: Secondary | ICD-10-CM

## 2023-05-11 DIAGNOSIS — Z681 Body mass index (BMI) 19 or less, adult: Secondary | ICD-10-CM

## 2023-05-11 DIAGNOSIS — I10 Essential (primary) hypertension: Secondary | ICD-10-CM | POA: Diagnosis present

## 2023-05-11 DIAGNOSIS — N400 Enlarged prostate without lower urinary tract symptoms: Secondary | ICD-10-CM | POA: Diagnosis present

## 2023-05-11 DIAGNOSIS — Z9049 Acquired absence of other specified parts of digestive tract: Secondary | ICD-10-CM

## 2023-05-11 DIAGNOSIS — M810 Age-related osteoporosis without current pathological fracture: Secondary | ICD-10-CM | POA: Diagnosis present

## 2023-05-11 DIAGNOSIS — I5042 Chronic combined systolic (congestive) and diastolic (congestive) heart failure: Secondary | ICD-10-CM | POA: Diagnosis not present

## 2023-05-11 DIAGNOSIS — J690 Pneumonitis due to inhalation of food and vomit: Secondary | ICD-10-CM | POA: Diagnosis not present

## 2023-05-11 DIAGNOSIS — R0789 Other chest pain: Secondary | ICD-10-CM | POA: Diagnosis present

## 2023-05-11 DIAGNOSIS — I4891 Unspecified atrial fibrillation: Secondary | ICD-10-CM | POA: Diagnosis present

## 2023-05-11 DIAGNOSIS — Z8616 Personal history of COVID-19: Secondary | ICD-10-CM

## 2023-05-11 DIAGNOSIS — E039 Hypothyroidism, unspecified: Secondary | ICD-10-CM | POA: Diagnosis not present

## 2023-05-11 DIAGNOSIS — J90 Pleural effusion, not elsewhere classified: Secondary | ICD-10-CM | POA: Diagnosis not present

## 2023-05-11 DIAGNOSIS — E119 Type 2 diabetes mellitus without complications: Secondary | ICD-10-CM

## 2023-05-11 DIAGNOSIS — I77810 Thoracic aortic ectasia: Secondary | ICD-10-CM | POA: Diagnosis not present

## 2023-05-11 DIAGNOSIS — Z79890 Hormone replacement therapy (postmenopausal): Secondary | ICD-10-CM

## 2023-05-11 DIAGNOSIS — Z833 Family history of diabetes mellitus: Secondary | ICD-10-CM

## 2023-05-11 DIAGNOSIS — R627 Adult failure to thrive: Secondary | ICD-10-CM | POA: Diagnosis not present

## 2023-05-11 DIAGNOSIS — E041 Nontoxic single thyroid nodule: Secondary | ICD-10-CM | POA: Diagnosis not present

## 2023-05-11 DIAGNOSIS — E7800 Pure hypercholesterolemia, unspecified: Secondary | ICD-10-CM | POA: Diagnosis present

## 2023-05-11 DIAGNOSIS — R634 Abnormal weight loss: Secondary | ICD-10-CM | POA: Diagnosis present

## 2023-05-11 DIAGNOSIS — J929 Pleural plaque without asbestos: Secondary | ICD-10-CM | POA: Diagnosis not present

## 2023-05-11 DIAGNOSIS — H9193 Unspecified hearing loss, bilateral: Secondary | ICD-10-CM | POA: Diagnosis present

## 2023-05-11 DIAGNOSIS — R1310 Dysphagia, unspecified: Secondary | ICD-10-CM | POA: Diagnosis not present

## 2023-05-11 DIAGNOSIS — Z7982 Long term (current) use of aspirin: Secondary | ICD-10-CM

## 2023-05-11 DIAGNOSIS — Z79899 Other long term (current) drug therapy: Secondary | ICD-10-CM

## 2023-05-11 DIAGNOSIS — R6339 Other feeding difficulties: Secondary | ICD-10-CM | POA: Diagnosis present

## 2023-05-11 DIAGNOSIS — Z8673 Personal history of transient ischemic attack (TIA), and cerebral infarction without residual deficits: Secondary | ICD-10-CM

## 2023-05-11 DIAGNOSIS — E871 Hypo-osmolality and hyponatremia: Secondary | ICD-10-CM | POA: Diagnosis present

## 2023-05-11 DIAGNOSIS — Z7983 Long term (current) use of bisphosphonates: Secondary | ICD-10-CM

## 2023-05-11 DIAGNOSIS — E1151 Type 2 diabetes mellitus with diabetic peripheral angiopathy without gangrene: Secondary | ICD-10-CM | POA: Diagnosis present

## 2023-05-11 DIAGNOSIS — A419 Sepsis, unspecified organism: Secondary | ICD-10-CM | POA: Diagnosis not present

## 2023-05-11 DIAGNOSIS — R64 Cachexia: Secondary | ICD-10-CM | POA: Diagnosis present

## 2023-05-11 DIAGNOSIS — D72829 Elevated white blood cell count, unspecified: Secondary | ICD-10-CM | POA: Diagnosis present

## 2023-05-11 DIAGNOSIS — R079 Chest pain, unspecified: Secondary | ICD-10-CM

## 2023-05-11 DIAGNOSIS — Z974 Presence of external hearing-aid: Secondary | ICD-10-CM | POA: Diagnosis not present

## 2023-05-11 DIAGNOSIS — I110 Hypertensive heart disease with heart failure: Secondary | ICD-10-CM | POA: Diagnosis not present

## 2023-05-11 DIAGNOSIS — K5909 Other constipation: Secondary | ICD-10-CM | POA: Diagnosis present

## 2023-05-11 DIAGNOSIS — N390 Urinary tract infection, site not specified: Secondary | ICD-10-CM | POA: Diagnosis not present

## 2023-05-11 DIAGNOSIS — H903 Sensorineural hearing loss, bilateral: Secondary | ICD-10-CM | POA: Diagnosis present

## 2023-05-11 DIAGNOSIS — K5730 Diverticulosis of large intestine without perforation or abscess without bleeding: Secondary | ICD-10-CM | POA: Diagnosis not present

## 2023-05-11 DIAGNOSIS — H918X3 Other specified hearing loss, bilateral: Secondary | ICD-10-CM | POA: Diagnosis not present

## 2023-05-11 DIAGNOSIS — R54 Age-related physical debility: Secondary | ICD-10-CM | POA: Diagnosis present

## 2023-05-11 DIAGNOSIS — J189 Pneumonia, unspecified organism: Secondary | ICD-10-CM | POA: Diagnosis not present

## 2023-05-11 HISTORY — DX: Systemic inflammatory response syndrome (sirs) of non-infectious origin without acute organ dysfunction: R65.10

## 2023-05-11 LAB — BASIC METABOLIC PANEL
Anion gap: 10 (ref 5–15)
BUN: 19 mg/dL (ref 8–23)
CO2: 25 mmol/L (ref 22–32)
Calcium: 8.8 mg/dL — ABNORMAL LOW (ref 8.9–10.3)
Chloride: 96 mmol/L — ABNORMAL LOW (ref 98–111)
Creatinine, Ser: 1.27 mg/dL — ABNORMAL HIGH (ref 0.61–1.24)
GFR, Estimated: 54 mL/min — ABNORMAL LOW (ref >60)
Glucose, Bld: 217 mg/dL — ABNORMAL HIGH (ref 70–99)
Potassium: 4.5 mmol/L (ref 3.5–5.1)
Sodium: 131 mmol/L — ABNORMAL LOW (ref 135–145)

## 2023-05-11 LAB — URINALYSIS, ROUTINE W REFLEX MICROSCOPIC
Bilirubin Urine: NEGATIVE
Glucose, UA: NEGATIVE mg/dL
Ketones, ur: NEGATIVE mg/dL
Nitrite: NEGATIVE
Protein, ur: 100 mg/dL — AB
Specific Gravity, Urine: 1.02 (ref 1.005–1.030)
pH: 7 (ref 5.0–8.0)

## 2023-05-11 LAB — CBC
HCT: 40.6 % (ref 39.0–52.0)
Hemoglobin: 13.8 g/dL (ref 13.0–17.0)
MCH: 30.7 pg (ref 26.0–34.0)
MCHC: 34 g/dL (ref 30.0–36.0)
MCV: 90.4 fL (ref 80.0–100.0)
Platelets: 265 10*3/uL (ref 150–400)
RBC: 4.49 MIL/uL (ref 4.22–5.81)
RDW: 13.6 % (ref 11.5–15.5)
WBC: 17.7 10*3/uL — ABNORMAL HIGH (ref 4.0–10.5)
nRBC: 0 % (ref 0.0–0.2)

## 2023-05-11 LAB — RESP PANEL BY RT-PCR (RSV, FLU A&B, COVID)  RVPGX2
Influenza A by PCR: NEGATIVE
Influenza B by PCR: NEGATIVE
Resp Syncytial Virus by PCR: NEGATIVE
SARS Coronavirus 2 by RT PCR: NEGATIVE

## 2023-05-11 LAB — URINALYSIS, MICROSCOPIC (REFLEX)

## 2023-05-11 LAB — TROPONIN I (HIGH SENSITIVITY): Troponin I (High Sensitivity): 14 ng/L (ref <18)

## 2023-05-11 MED ORDER — ACETAMINOPHEN 650 MG RE SUPP
650.0000 mg | Freq: Once | RECTAL | Status: AC
  Administered 2023-05-11: 650 mg via RECTAL
  Filled 2023-05-11: qty 1

## 2023-05-11 MED ORDER — SODIUM CHLORIDE 0.9 % IV BOLUS
500.0000 mL | Freq: Once | INTRAVENOUS | Status: AC
  Administered 2023-05-11: 500 mL via INTRAVENOUS

## 2023-05-11 MED ORDER — SODIUM CHLORIDE 0.9 % IV SOLN
1.0000 g | Freq: Once | INTRAVENOUS | Status: AC
  Administered 2023-05-11: 1 g via INTRAVENOUS
  Filled 2023-05-11: qty 10

## 2023-05-11 MED ORDER — ALUM & MAG HYDROXIDE-SIMETH 200-200-20 MG/5ML PO SUSP
30.0000 mL | Freq: Once | ORAL | Status: AC
  Administered 2023-05-11: 30 mL via ORAL
  Filled 2023-05-11: qty 30

## 2023-05-11 MED ORDER — SODIUM CHLORIDE 0.9 % IV SOLN
500.0000 mg | Freq: Once | INTRAVENOUS | Status: AC
  Administered 2023-05-11: 500 mg via INTRAVENOUS
  Filled 2023-05-11: qty 5

## 2023-05-11 MED ORDER — SODIUM CHLORIDE 0.9 % IV BOLUS: 500.0000 mL | Freq: Once | INTRAVENOUS | Status: DC

## 2023-05-11 MED ORDER — LIDOCAINE VISCOUS HCL 2 % MT SOLN
15.0000 mL | Freq: Once | OROMUCOSAL | Status: AC
  Administered 2023-05-11: 15 mL via ORAL
  Filled 2023-05-11: qty 15

## 2023-05-11 NOTE — Discharge Instructions (Addendum)
Follow-up with the gastroenterologist.  I have placed a referral.  Please follow-up with your primary care doctor in 1 week to recheck labs.  Return to the emergency room for any new or concerning symptoms.  In the meantime please cut up any meat that you eat into small pieces.  I recommend refraining from steak and pork.  Make sure you are drinking plenty of water and taking your prescribed medications.

## 2023-05-11 NOTE — Progress Notes (Signed)
Plan of Care Note for accepted transfer   Patient: Raymond Fowler MRN: [id number]   DOA: 05/11/2023  Facility requesting transfer: [facility]   Requesting Provider: Solon Augusta, PA  Reason for transfer: SIRS, general weakness  Facility course: 89 yr old man with HTN, DM, CVA, SVT, and HFrEF who developed right-sided chest pain last night after he had difficulty swallowing a piece of meat.  The chest pain is reportedly resolved but he was noted to be febrile to 38.9 C with WBC 17,700.  No acute findings noted on chest x-ray.  Bacteriuria present on UA.  He was treated with acetaminophen, GI cocktail, 500 mL NS, Rocephin, and azithromycin.  Blood cultures and respiratory virus panel have been ordered.  Plan of care: The patient is accepted for admission to Telemetry unit, at [facility].   Author: Briscoe Deutscher, MD 05/11/2023  Check [email or web address] for on-call coverage.  Nursing staff, Please call [facility] Admits & Consults System-Wide number on Amion as soon as patient's arrival, so appropriate admitting provider can evaluate the pt.

## 2023-05-11 NOTE — ED Triage Notes (Signed)
Per family pt started haeving Chest pain last night, denies SOB , Denies N/V  Denies any injury to chest  States swallowed a piece of meat last night and feels it may have gotten stuck

## 2023-05-11 NOTE — ED Provider Notes (Addendum)
[place] [facility] DE[facility]MENT AT [facility] Provider Note   CSN: [id number] Arrival date & time: 05/11/23  1444     History  Chief Complaint  Patient presents with   Chest Pain    Raymond Fowler is a 89 y.o. male.   Chest Pain  Patient is an 89 year old male with a past medical history significant for stroke, DM2, HTN, bilateral severe hearing loss, frailty, osteoporosis, malnutrition, SVT, heart failure with EF of 35-40%, hyponatremia, hypophosphatemia, hypomagnesemia  Patient brought in by wife Raymond Fowler who states that patient has been endorsing right-sided chest pain since last night he describes as achy and constant but states that it is not present currently.  Seems that it occurred soon after eating dinner last night which included pork.  He feels like he had some food that got stuck in his esophagus.  He is not having trouble swallowing or breathing during this episode.  He has been tolerating p.o. since this occurred.  He states that he was uncomfortable to rest the evening last night was able to sleep in a recliner.  Woke up this morning did not complain of any pain and he does not recall hurting.  Seems that he again felt some right-sided chest pain after he began eating lunch earlier today.  He states that the pain lasted for a few minutes and is now resolved however his wife brought her to the emergency room for evaluation.     Home Medications Prior to Admission medications   Medication Sig Start Date End Date Taking? Authorizing Provider  alendronate (FOSAMAX) 70 MG tablet Take 70 mg by mouth once a week. 06/16/21   [provider]  amiodarone (PACERONE) 100 MG tablet Take 1 tablet (100 mg total) by mouth daily. 02/27/23   Revankar, Aundra Dubin, MD  Ascorbic Acid (VITAMIN C) 1000 MG tablet Take 1,000 mg by mouth daily.    [provider]  aspirin EC 325 MG tablet Take 325 mg by mouth daily.    [provider]  atorvastatin (LIPITOR)  10 MG tablet Take 1 tablet (10 mg total) by mouth every evening. 08/31/21   Revankar, Aundra Dubin, MD  brimonidine (ALPHAGAN) 0.2 % ophthalmic solution Place 1 drop into both eyes 2 (two) times daily. 05/29/15   [provider]  carboxymethylcellul-glycerin (REFRESH OPTIVE) 0.5-0.9 % ophthalmic solution Place 1 drop into both eyes 2 (two) times daily.    [provider]  finasteride (PROSCAR) 5 MG tablet Take 5 mg by mouth daily. 04/19/21   [provider]  levothyroxine (SYNTHROID) 50 MCG tablet Take 50 mcg by mouth daily before breakfast. 10/04/22   [provider]  liothyronine (CYTOMEL) 25 MCG tablet Take 25 mcg by mouth daily. 02/21/23   [provider]  metoprolol succinate (TOPROL-XL) 25 MG 24 hr tablet Take 0.5 tablets (12.5 mg total) by mouth daily. Take with or immediately following a meal. 01/17/22 03/16/23  Revankar, Aundra Dubin, MD  Multiple Vitamins-Minerals (MULTIVITAMIN WITH MINERALS) tablet Take 1 tablet by mouth daily.    [provider]  Omega-3 Fatty Acids (FISH OIL) 1200 MG CAPS Take 1,200 mg by mouth daily.    [provider]  polyethylene glycol (MIRALAX) 17 g packet Take 17 g by mouth daily. 03/16/23   Tegeler, Canary Brim, MD  sacubitril-valsartan (ENTRESTO) 24-26 MG Take 1 tablet by mouth 2 (two) times daily. 02/09/23   Revankar, Aundra Dubin, MD      Allergies    Patient has no  known allergies.    Review of Systems   Review of Systems  Cardiovascular:  Positive for chest pain.    Physical Exam Updated Vital Signs BP (!) 162/84   Pulse 89   Temp (!) 102.1 F (38.9 C) (Rectal)   Resp (!) 27   Ht 5\' 10"  (1.778 m)   Wt 49.9 kg   SpO2 95%   BMI 15.78 kg/m  Physical Exam Vitals and nursing note reviewed.  Constitutional:      General: He is not in acute distress.    Comments: Cachectic 89 year old male extremely hard of hearing, pleasant, able answer questions appropriately.  Extremely weak, has difficulty  following commands.  HENT:     Head: Normocephalic and atraumatic.     Nose: Nose normal.     Mouth/Throat:     Mouth: Mucous membranes are moist.  Eyes:     General: No scleral icterus. Cardiovascular:     Rate and Rhythm: Normal rate and regular rhythm.     Pulses: Normal pulses.     Heart sounds: Normal heart sounds.  Pulmonary:     Effort: Pulmonary effort is normal. No respiratory distress.     Breath sounds: No wheezing.  Abdominal:     Palpations: Abdomen is soft.     Tenderness: There is no abdominal tenderness. There is no guarding or rebound.  Musculoskeletal:     Cervical back: Normal range of motion.     Right lower leg: No edema.     Left lower leg: No edema.  Skin:    General: Skin is warm and dry.     Capillary Refill: Capillary refill takes less than 2 seconds.  Neurological:     Mental Status: He is alert. Mental status is at baseline.  Psychiatric:        Mood and Affect: Mood normal.        Behavior: Behavior normal.     ED Results / Procedures / Treatments   Labs (all labs ordered are listed, but only abnormal results are displayed) Labs Reviewed  BASIC METABOLIC PANEL - Abnormal; Notable for the following components:      Result Value   Sodium 131 (*)    Chloride 96 (*)    Glucose, Bld 217 (*)    Creatinine, Ser 1.27 (*)    Calcium 8.8 (*)    GFR, Estimated 54 (*)    All other components within normal limits  CBC - Abnormal; Notable for the following components:   WBC 17.7 (*)    All other components within normal limits  URINALYSIS, ROUTINE W REFLEX MICROSCOPIC  TROPONIN I (HIGH SENSITIVITY)    EKG EKG Interpretation Date/Time:  Thursday May 11 2023 14:59:05 EST Ventricular Rate:  90 PR Interval:  200 QRS Duration:  102 QT Interval:  363 QTC Calculation: 445 R Axis:   1  Text Interpretation: Sinus rhythm Anteroseptal infarct, age indeterminate Confirmed by Glyn Ade [phone]) on 05/11/2023 3:44:45 PM  Radiology DG  Chest Portable 1 View Result Date: 05/11/2023 CLINICAL DATA:  Chest pain, possible food impaction EXAM: PORTABLE CHEST 1 VIEW COMPARISON:  10/02/2022 FINDINGS: Upright frontal view of the chest demonstrates a stable cardiac silhouette. Continued ectasia of the thoracic aorta. No airspace disease, effusion, or pneumothorax. Chronic scarring or atelectasis at the left lung base, with stable elevation of the left hemidiaphragm. No acute bony abnormalities. IMPRESSION: 1. No acute intrathoracic process. 2. If food impaction remains a clinical concern, esophagram or upright chest x-ray after  oral contrast ingestion could be performed. Electronically Signed   By: Sharlet Salina M.D.   On: 05/11/2023 16:17    Procedures .Critical Care  Performed by: Gailen Shelter, PA Authorized by: Gailen Shelter, PA   Critical care provider statement:    Critical care time (minutes):  30   Critical care was necessary to treat or prevent imminent or life-threatening deterioration of the following conditions: infection requiring IV abx.   Critical care was time spent personally by me on the following activities:  Development of treatment plan with patient or surrogate, evaluation of patient's response to treatment, examination of patient, ordering and review of laboratory studies, ordering and review of radiographic studies, ordering and performing treatments and interventions, pulse oximetry, re-evaluation of patient's condition and review of old charts    Medications Ordered in ED Medications  acetaminophen (TYLENOL) suppository 650 mg (has no administration in time range)  cefTRIAXone (ROCEPHIN) 1 g in sodium chloride 0.9 % 100 mL IVPB (has no administration in time range)  azithromycin (ZITHROMAX) 500 mg in sodium chloride 0.9 % 250 mL IVPB (has no administration in time range)  sodium chloride 0.9 % bolus 500 mL (has no administration in time range)  alum & mag hydroxide-simeth (MAALOX/MYLANTA) 200-200-20  MG/5ML suspension 30 mL (30 mLs Oral Given 05/11/23 1539)    And  lidocaine (XYLOCAINE) 2 % viscous mouth solution 15 mL (15 mLs Oral Given 05/11/23 1539)    ED Course/ Medical Decision Making/ A&P Clinical Course as of 05/11/23 2059  Thu May 11, 2023  596 Stable 89 year old male with nonspecific chest pain related to p.o. intake.  No acute distress.  Nonspecific leukocytosis.  No other focal symptoms symptoms all resolved symptoms started yesterday. Planning for single troponin, GI cocktail, p.o. trial reassessment with PCP within 48 hours. [CC]  1856 Admit for pneumonia versus UTI related fever and significant weakness.  Recent difficulty swallowing episode yesterday. [WF]    Clinical Course User Index [CC] Glyn Ade, MD [WF] Gailen Shelter, [place]                                 Medical Decision Making Amount and/or Complexity of Data Reviewed Labs: ordered. Radiology: ordered.  Risk OTC drugs. Prescription drug management. Decision regarding hospitalization.   This patient presents to the ED for concern of CP, this involves a number of treatment options, and is a complaint that carries with it a moderate to high risk of complications and morbidity. A differential diagnosis was considered for the patient's symptoms which is discussed below:   The emergent causes of chest pain include: Acute coronary syndrome, tamponade, pericarditis/myocarditis, aortic dissection, pulmonary embolism, tension pneumothorax, pneumonia, and esophageal rupture.   I do not believe the patient has an emergent cause of chest pain, other urgent/non-acute considerations include, but are not limited to: chronic angina, aortic stenosis, cardiomyopathy, mitral valve prolapse, pulmonary hypertension, aortic insufficiency, right ventricular hypertrophy, pleuritis, bronchitis, pneumothorax, tumor, gastroesophageal reflux disease (GERD), esophageal spasm, Mallory-Weiss syndrome, peptic ulcer disease,  pancreatitis, functional gastrointestinal pain, cervical or thoracic disk disease or arthritis, shoulder arthritis, costochondritis, subacromial bursitis, anxiety or panic attack, herpes zoster, breast disorders, chest wall tumors, thoracic outlet syndrome, mediastinitis.    Co morbidities: Discussed in HPI   Brief History:  Patient is an 89 year old male with a past medical history significant for stroke, DM2, HTN, bilateral severe hearing loss, frailty, osteoporosis, malnutrition, SVT, heart failure  with EF of 35-40%, hyponatremia, hypophosphatemia, hypomagnesemia  Patient brought in by wife Raymond Fowler who states that patient has been endorsing right-sided chest pain since last night he describes as achy and constant but states that it is not present currently.  Seems that it occurred soon after eating dinner last night which included pork.  He feels like he had some food that got stuck in his esophagus.  He is not having trouble swallowing or breathing during this episode.  He has been tolerating p.o. since this occurred.  He states that he was uncomfortable to rest the evening last night was able to sleep in a recliner.  Woke up this morning did not complain of any pain and he does not recall hurting.  Seems that he again felt some right-sided chest pain after he began eating lunch earlier today.  He states that the pain lasted for a few minutes and is now resolved however his wife brought her to the emergency room for evaluation.   On repeat evaluation patient seems to be somewhat more restless than before.  Wife states that she feels that he is antsy from laying in the bed.  We had a shared decision-making conversation and ultimately decided to discharge home as patient does not endorse any chest pain currently.  However when attempting to prepare patient for discharge he was noted to be too weak to stand which is a change from his baseline.  He was also noticed to be hot by RN who called me  to bedside.  Rectal temperature was obtained which is 102.1 and he seems to be somewhat more confused than he was previously.  He is not able to stand or even sit up without help.     EMR reviewed including pt PMHx, past surgical history and past visits to ER.   See HPI for more details   Lab Tests:   I ordered and independently interpreted labs. Labs notable for Leukocytosis of 17.7, BMP with mild increase in creatinine, hyperglycemia 217, baseline hyponatremia unchanged  Troponin x 1 normal, urinalysis pending  Imaging Studies:  NAD. I personally reviewed all imaging studies and no acute abnormality found. I agree with radiology interpretation.    Cardiac Monitoring:  The patient was maintained on a cardiac monitor.  I personally viewed and interpreted the cardiac monitored which showed an underlying rhythm of: NSR EKG non-ischemic   Medicines ordered:  I ordered medication including GI cocktail, after the fever was identified Tylenol, azithromycin, Rocephin, 500 mL of normal saline for treatment of aspiration pneumonia and hydration Reevaluation of the patient after these medicines showed that the patient improved I have reviewed the patients home medicines and have made adjustments as needed   Critical Interventions:     Consults/Attending Physician  I discussed this case with my attending physician who cosigned this note including patient's presenting symptoms, physical exam, and planned diagnostics and interventions. Attending physician stated agreement with plan or made changes to plan which were implemented.   Attending physician assessed patient at bedside.    Reevaluation:  After the interventions noted above I re-evaluated patient and found that they have :improved   Social Determinants of Health:      Problem List / ED Course:  Patient is 89 year old male with some degree of dysphagia based on history who presented emergency room today with  chest pain intermittently since eating food and choking sound last night.  Seems that he is had some coughing episodes 1 of which was witnessed  by my supervising physician.  Developed a fever here and has a white count of 17.7 chronically hyponatremic.  Seems to be weak enough that he is unable to stand or walk.  Will admit to medicine.  Started on CAP treatment. Dysphagia - may benefit from outpatient workup.  Admitted to hospitalist service   Dispostion:  After consideration of the diagnostic results and the patients response to treatment, I feel that the patent would benefit from admission.    Final Clinical Impression(s) / ED Diagnoses Final diagnoses:  Chest pain, unspecified type  Aspiration pneumonia, unspecified aspiration pneumonia type, unspecified laterality, unspecified part of lung [facility])    Rx / DC Orders ED Discharge Orders          Ordered    Ambulatory referral to Gastroenterology        05/11/23 1713              Gailen Shelter, [place] 05/11/23 2100    Gailen Shelter, PA 05/11/23 2100    Gailen Shelter, [place] 05/11/23 2102    Glyn Ade, MD 05/12/23 [phone]

## 2023-05-11 NOTE — ED Notes (Signed)
ED TO INPATIENT HANDOFF REPORT  ED Nurse Name and Phone #:  Leanord Hawking, RN  S Name/Age/Gender Raymond Fowler 89 y.o. male Room/Bed: MH09/MH09  Code Status   Code Status: Prior  Home/SNF/Other Home Patient oriented to: self Is this baseline? Yes   Triage Complete: Triage complete  Chief Complaint SIRS (systemic inflammatory response syndrome) (HCC) [R65.10]  Triage Note Per family pt started haeving Chest pain last night, denies SOB , Denies N/V  Denies any injury to chest  States swallowed a piece of meat last night and feels it may have gotten stuck     Allergies No Known Allergies  Level of Care/Admitting Diagnosis ED Disposition     ED Disposition  Admit   Condition  --   Comment  Hospital Area: [facility] [[id number]]  Level of Care: Telemetry [5]  Admit to tele based on following criteria: Monitor for Ischemic changes  Interfacility transfer: Yes  May place patient in observation at [facility] or [facility] if equivalent level of care is available:: Yes  Covid Evaluation: Asymptomatic - no recent exposure (last 10 days) testing not required  Diagnosis: SIRS (systemic inflammatory response syndrome) [facility]) [[id number]]  Admitting Physician: Briscoe Deutscher [[id number]]  Attending Physician: Briscoe Deutscher [[id number]]          B Medical/Surgery History Past Medical History:  Diagnosis Date   Bilateral hearing loss 07/29/2015   Bilateral impacted cerumen 07/29/2015   BPH (benign prostatic hyperplasia) 07/12/2021   Cardiac murmur 07/09/2021   Cerebrovascular disease 11/04/2016   Cochlear implant in place 02/24/2020   COVID-19 virus infection 07/12/2021   Diabetes mellitus without complication (HCC)    Diet-controlled diabetes mellitus (HCC) 07/09/2021   Dilated cardiomyopathy (HCC) 07/14/2021   Dizziness and giddiness 11/04/2016   Essential hypertension 07/06/2021   Frailty 07/06/2021   Hardening of the aorta (main artery of the heart) (HCC) 07/06/2021    Hypertension    Hypomagnesemia 07/12/2021   Hyponatremia 07/12/2021   Hypophosphatemia 07/13/2021   Normocytic anemia 07/12/2021   Osteoporosis 07/06/2021   Palpitations 07/09/2021   Pathological fracture of vertebra 07/06/2021   Peripheral vascular disorder due to diabetes mellitus (HCC) 08/23/2021   Protein calorie malnutrition (HCC) 07/06/2021   Protein-calorie malnutrition, severe 07/12/2021   Pure hypercholesterolemia 07/06/2021   Sensorineural hearing loss (SNHL) of both ears 02/27/2018   Subclinical hypothyroidism 07/13/2021   Supraventricular tachycardia (HCC) 07/09/2021   SVT (supraventricular tachycardia) (HCC) 07/11/2021   Thrombocytopenia (HCC) 07/12/2021   Type 2 diabetes mellitus with other circulatory complications (HCC) 07/06/2021   Wears hearing aid in right ear 03/10/2020   Past Surgical History:  Procedure Laterality Date   APPENDECTOMY     CHOLECYSTECTOMY     COCHLEAR IMPLANT     EYE SURGERY     FRACTURE SURGERY     HERNIA REPAIR     TONSILLECTOMY       A IV Location/Drains/Wounds Patient Lines/Drains/Airways Status     Active Line/Drains/Airways     Name Placement date Placement time Site Days   Peripheral IV 05/11/23 20 G 1" Anterior;Distal;Right;Upper Arm 05/11/23  1753  Arm  less than 1   Urethral Catheter 09/12/22  1200  --  241   Wound / Incision (Open or Dehisced) 07/11/21 Skin tear Arm Anterior;Proximal;Right;Upper 3cm skin tear 07/11/21  1615  Arm  669            Intake/Output Last 24 hours  Intake/Output Summary (Last 24 hours) at 05/11/2023 1947 Last data  filed at 05/11/2023 1835 Gross per 24 hour  Intake 99.5 ml  Output --  Net 99.5 ml    Labs/Imaging Results for orders placed or performed during the hospital encounter of 05/11/23 (from the past 48 hours)  Basic metabolic panel     Status: Abnormal   Collection Time: 05/11/23  3:04 PM  Result Value Ref Range   Sodium 131 (L) 135 - 145 mmol/L   Potassium 4.5 3.5 - 5.1 mmol/L   Chloride 96 (L)  98 - 111 mmol/L   CO2 25 22 - 32 mmol/L   Glucose, Bld 217 (H) 70 - 99 mg/dL    Comment: Glucose reference range applies only to samples taken after fasting for at least 8 hours.   BUN 19 8 - 23 mg/dL   Creatinine, Ser 4.09 (H) 0.61 - 1.24 mg/dL   Calcium 8.8 (L) 8.9 - 10.3 mg/dL   GFR, Estimated 54 (L) >60 mL/min    Comment: (NOTE) Calculated using the CKD-EPI Creatinine Equation (2021)    Anion gap 10 5 - 15    Comment: Performed at [facility], [address] [place], [place] [address]  CBC     Status: Abnormal   Collection Time: 05/11/23  3:04 PM  Result Value Ref Range   WBC 17.7 (H) 4.0 - 10.5 K/uL   RBC 4.49 4.22 - 5.81 MIL/uL   Hemoglobin 13.8 13.0 - 17.0 g/dL   HCT 47.8 29.5 - 62.1 %   MCV 90.4 80.0 - 100.0 fL   MCH 30.7 26.0 - 34.0 pg   MCHC 34.0 30.0 - 36.0 g/dL   RDW 30.8 65.7 - 84.6 %   Platelets 265 150 - 400 K/uL   nRBC 0.0 0.0 - 0.2 %    Comment: Performed at [facility], [address] [place], [place] [address]  Troponin I (High Sensitivity)     Status: None   Collection Time: 05/11/23  3:04 PM  Result Value Ref Range   Troponin I (High Sensitivity) 14 <18 ng/L    Comment: (NOTE) Elevated high sensitivity troponin I (hsTnI) values and significant  changes across serial measurements may suggest ACS but many other  chronic and acute conditions are known to elevate hsTnI results.  Refer to the "Links" section for chest pain algorithms and additional  guidance. Performed at [facility], [address] [place], [place] [address]   Urinalysis, Routine w reflex microscopic -Urine, Clean Catch     Status: Abnormal   Collection Time: 05/11/23  6:14 PM  Result Value Ref Range   Color, Urine YELLOW YELLOW   APPearance CLOUDY (A) CLEAR   Specific Gravity, Urine 1.020 1.005 - 1.030   pH 7.0 5.0 - 8.0   Glucose, UA NEGATIVE NEGATIVE mg/dL   Hgb urine dipstick SMALL (A) NEGATIVE   Bilirubin Urine NEGATIVE NEGATIVE   Ketones,  ur NEGATIVE NEGATIVE mg/dL   Protein, ur 244 (A) NEGATIVE mg/dL   Nitrite NEGATIVE NEGATIVE   Leukocytes,Ua MODERATE (A) NEGATIVE    Comment: Performed at [facility], [address] [place], [place] [address]  Urinalysis, Microscopic (reflex)     Status: Abnormal   Collection Time: 05/11/23  6:14 PM  Result Value Ref Range   RBC / HPF 0-5 0 - 5 RBC/hpf   WBC, UA 11-20 0 - 5 WBC/hpf   Bacteria, UA MANY (A) NONE SEEN   Squamous Epithelial / HPF 0-5 0 -  5 /HPF    Comment: Performed at [facility], [address] [place], [place] [address]   DG Chest Portable 1 View Result Date: 05/11/2023 CLINICAL DATA:  Chest pain, possible food impaction EXAM: PORTABLE CHEST 1 VIEW COMPARISON:  10/02/2022 FINDINGS: Upright frontal view of the chest demonstrates a stable cardiac silhouette. Continued ectasia of the thoracic aorta. No airspace disease, effusion, or pneumothorax. Chronic scarring or atelectasis at the left lung base, with stable elevation of the left hemidiaphragm. No acute bony abnormalities. IMPRESSION: 1. No acute intrathoracic process. 2. If food impaction remains a clinical concern, esophagram or upright chest x-ray after oral contrast ingestion could be performed. Electronically Signed   By: Sharlet Salina M.D.   On: 05/11/2023 16:17    Pending Labs Unresulted Labs (From admission, onward)     Start     Ordered   05/11/23 1926  Blood culture (routine x 2)  BLOOD CULTURE X 2,   STAT      05/11/23 1925   05/11/23 1925  Resp panel by RT-PCR (RSV, Flu A&B, Covid) Anterior Nasal Swab  Once,   URGENT        05/11/23 1925            Vitals/Pain Today's Vitals   05/11/23 1900 05/11/23 1914 05/11/23 1915 05/11/23 1930  BP: (!) 149/71   (!) 140/82  Pulse: 80  76 75  Resp: 18  17 19   Temp:    99.6 F (37.6 C)  TempSrc:    Oral  SpO2: 97%  98% 97%  Weight:      Height:      PainSc:  1       Isolation Precautions No active  isolations  Medications Medications  alum & mag hydroxide-simeth (MAALOX/MYLANTA) 200-200-20 MG/5ML suspension 30 mL (30 mLs Oral Given 05/11/23 1539)    And  lidocaine (XYLOCAINE) 2 % viscous mouth solution 15 mL (15 mLs Oral Given 05/11/23 1539)  acetaminophen (TYLENOL) suppository 650 mg (650 mg Rectal Given 05/11/23 1813)  cefTRIAXone (ROCEPHIN) 1 g in sodium chloride 0.9 % 100 mL IVPB (0 g Intravenous Stopped 05/11/23 1835)  azithromycin (ZITHROMAX) 500 mg in sodium chloride 0.9 % 250 mL IVPB (500 mg Intravenous New Bag/Given 05/11/23 1842)  sodium chloride 0.9 % bolus 500 mL (500 mLs Intravenous New Bag/Given 05/11/23 1802)    Mobility walks with person assist     Focused Assessments Pulmonary Assessment Handoff:  Lung sounds:   O2 Device: Room Air      R Recommendations: See Admitting Provider Note  Report given to:   Additional Notes:    Pt is hearing impaired - wears bilateral hearing aids

## 2023-05-11 NOTE — ED Notes (Signed)
[facility] called for transport, No ETA ED Nurse will call floor to give report Called @ 19:49

## 2023-05-12 ENCOUNTER — Observation Stay (HOSPITAL_COMMUNITY): Payer: Medicare Other

## 2023-05-12 ENCOUNTER — Inpatient Hospital Stay (HOSPITAL_COMMUNITY): Payer: Medicare Other

## 2023-05-12 DIAGNOSIS — I471 Supraventricular tachycardia, unspecified: Secondary | ICD-10-CM | POA: Diagnosis present

## 2023-05-12 DIAGNOSIS — K5730 Diverticulosis of large intestine without perforation or abscess without bleeding: Secondary | ICD-10-CM | POA: Diagnosis not present

## 2023-05-12 DIAGNOSIS — Z9621 Cochlear implant status: Secondary | ICD-10-CM | POA: Diagnosis present

## 2023-05-12 DIAGNOSIS — R1310 Dysphagia, unspecified: Secondary | ICD-10-CM

## 2023-05-12 DIAGNOSIS — J9811 Atelectasis: Secondary | ICD-10-CM | POA: Diagnosis present

## 2023-05-12 DIAGNOSIS — E1151 Type 2 diabetes mellitus with diabetic peripheral angiopathy without gangrene: Secondary | ICD-10-CM | POA: Diagnosis present

## 2023-05-12 DIAGNOSIS — Z9181 History of falling: Secondary | ICD-10-CM | POA: Insufficient documentation

## 2023-05-12 DIAGNOSIS — R634 Abnormal weight loss: Secondary | ICD-10-CM

## 2023-05-12 DIAGNOSIS — Z833 Family history of diabetes mellitus: Secondary | ICD-10-CM | POA: Diagnosis not present

## 2023-05-12 DIAGNOSIS — D649 Anemia, unspecified: Secondary | ICD-10-CM | POA: Diagnosis present

## 2023-05-12 DIAGNOSIS — E441 Mild protein-calorie malnutrition: Secondary | ICD-10-CM | POA: Diagnosis present

## 2023-05-12 DIAGNOSIS — R6510 Systemic inflammatory response syndrome (SIRS) of non-infectious origin without acute organ dysfunction: Secondary | ICD-10-CM | POA: Diagnosis present

## 2023-05-12 DIAGNOSIS — E871 Hypo-osmolality and hyponatremia: Secondary | ICD-10-CM | POA: Diagnosis present

## 2023-05-12 DIAGNOSIS — E041 Nontoxic single thyroid nodule: Secondary | ICD-10-CM | POA: Diagnosis not present

## 2023-05-12 DIAGNOSIS — Z9049 Acquired absence of other specified parts of digestive tract: Secondary | ICD-10-CM | POA: Diagnosis not present

## 2023-05-12 DIAGNOSIS — H918X3 Other specified hearing loss, bilateral: Secondary | ICD-10-CM | POA: Diagnosis not present

## 2023-05-12 DIAGNOSIS — E119 Type 2 diabetes mellitus without complications: Secondary | ICD-10-CM | POA: Diagnosis not present

## 2023-05-12 DIAGNOSIS — E7800 Pure hypercholesterolemia, unspecified: Secondary | ICD-10-CM | POA: Diagnosis present

## 2023-05-12 DIAGNOSIS — I10 Essential (primary) hypertension: Secondary | ICD-10-CM | POA: Diagnosis not present

## 2023-05-12 DIAGNOSIS — A419 Sepsis, unspecified organism: Secondary | ICD-10-CM | POA: Diagnosis not present

## 2023-05-12 DIAGNOSIS — R079 Chest pain, unspecified: Secondary | ICD-10-CM | POA: Diagnosis not present

## 2023-05-12 DIAGNOSIS — N400 Enlarged prostate without lower urinary tract symptoms: Secondary | ICD-10-CM | POA: Diagnosis present

## 2023-05-12 DIAGNOSIS — R64 Cachexia: Secondary | ICD-10-CM | POA: Diagnosis present

## 2023-05-12 DIAGNOSIS — Z79890 Hormone replacement therapy (postmenopausal): Secondary | ICD-10-CM | POA: Diagnosis not present

## 2023-05-12 DIAGNOSIS — I4891 Unspecified atrial fibrillation: Secondary | ICD-10-CM | POA: Diagnosis present

## 2023-05-12 DIAGNOSIS — N390 Urinary tract infection, site not specified: Secondary | ICD-10-CM | POA: Diagnosis present

## 2023-05-12 DIAGNOSIS — R627 Adult failure to thrive: Secondary | ICD-10-CM | POA: Diagnosis present

## 2023-05-12 DIAGNOSIS — J90 Pleural effusion, not elsewhere classified: Secondary | ICD-10-CM | POA: Diagnosis not present

## 2023-05-12 DIAGNOSIS — I5042 Chronic combined systolic (congestive) and diastolic (congestive) heart failure: Secondary | ICD-10-CM | POA: Diagnosis present

## 2023-05-12 DIAGNOSIS — R0789 Other chest pain: Secondary | ICD-10-CM

## 2023-05-12 DIAGNOSIS — Z974 Presence of external hearing-aid: Secondary | ICD-10-CM | POA: Diagnosis not present

## 2023-05-12 DIAGNOSIS — E039 Hypothyroidism, unspecified: Secondary | ICD-10-CM | POA: Diagnosis present

## 2023-05-12 DIAGNOSIS — Z681 Body mass index (BMI) 19 or less, adult: Secondary | ICD-10-CM | POA: Diagnosis not present

## 2023-05-12 DIAGNOSIS — Z8616 Personal history of COVID-19: Secondary | ICD-10-CM | POA: Diagnosis not present

## 2023-05-12 DIAGNOSIS — J929 Pleural plaque without asbestos: Secondary | ICD-10-CM | POA: Diagnosis not present

## 2023-05-12 DIAGNOSIS — I110 Hypertensive heart disease with heart failure: Secondary | ICD-10-CM | POA: Diagnosis present

## 2023-05-12 DIAGNOSIS — I420 Dilated cardiomyopathy: Secondary | ICD-10-CM | POA: Diagnosis present

## 2023-05-12 HISTORY — DX: Chronic combined systolic (congestive) and diastolic (congestive) heart failure: I50.42

## 2023-05-12 HISTORY — DX: Mild protein-calorie malnutrition: E44.1

## 2023-05-12 HISTORY — DX: Other chest pain: R07.89

## 2023-05-12 HISTORY — DX: Abnormal weight loss: R63.4

## 2023-05-12 HISTORY — DX: Dysphagia, unspecified: R13.10

## 2023-05-12 HISTORY — DX: History of falling: Z91.81

## 2023-05-12 LAB — CBC WITH DIFFERENTIAL/PLATELET
Abs Immature Granulocytes: 0.15 10*3/uL — ABNORMAL HIGH (ref 0.00–0.07)
Basophils Absolute: 0 10*3/uL (ref 0.0–0.1)
Basophils Relative: 0 %
Eosinophils Absolute: 0 10*3/uL (ref 0.0–0.5)
Eosinophils Relative: 0 %
HCT: 38.5 % — ABNORMAL LOW (ref 39.0–52.0)
Hemoglobin: 12.9 g/dL — ABNORMAL LOW (ref 13.0–17.0)
Immature Granulocytes: 1 %
Lymphocytes Relative: 5 %
Lymphs Abs: 1.1 10*3/uL (ref 0.7–4.0)
MCH: 30.7 pg (ref 26.0–34.0)
MCHC: 33.5 g/dL (ref 30.0–36.0)
MCV: 91.7 fL (ref 80.0–100.0)
Monocytes Absolute: 1.7 10*3/uL — ABNORMAL HIGH (ref 0.1–1.0)
Monocytes Relative: 9 %
Neutro Abs: 17.2 10*3/uL — ABNORMAL HIGH (ref 1.7–7.7)
Neutrophils Relative %: 85 %
Platelets: 220 10*3/uL (ref 150–400)
RBC: 4.2 MIL/uL — ABNORMAL LOW (ref 4.22–5.81)
RDW: 13.2 % (ref 11.5–15.5)
WBC: 20.1 10*3/uL — ABNORMAL HIGH (ref 4.0–10.5)
nRBC: 0 % (ref 0.0–0.2)

## 2023-05-12 LAB — GLUCOSE, CAPILLARY: Glucose-Capillary: 167 mg/dL — ABNORMAL HIGH (ref 70–99)

## 2023-05-12 LAB — BASIC METABOLIC PANEL
Anion gap: 9 (ref 5–15)
BUN: 19 mg/dL (ref 8–23)
CO2: 24 mmol/L (ref 22–32)
Calcium: 8.3 mg/dL — ABNORMAL LOW (ref 8.9–10.3)
Chloride: 100 mmol/L (ref 98–111)
Creatinine, Ser: 0.9 mg/dL (ref 0.61–1.24)
GFR, Estimated: >60 mL/min (ref >60)
Glucose, Bld: 137 mg/dL — ABNORMAL HIGH (ref 70–99)
Potassium: 3.8 mmol/L (ref 3.5–5.1)
Sodium: 133 mmol/L — ABNORMAL LOW (ref 135–145)

## 2023-05-12 LAB — HEPATIC FUNCTION PANEL
ALT: 15 U/L (ref 0–44)
AST: 19 U/L (ref 15–41)
Albumin: 3.2 g/dL — ABNORMAL LOW (ref 3.5–5.0)
Alkaline Phosphatase: 37 U/L — ABNORMAL LOW (ref 38–126)
Bilirubin, Direct: 0.2 mg/dL (ref 0.0–0.2)
Indirect Bilirubin: 0.8 mg/dL (ref 0.3–0.9)
Total Bilirubin: 1 mg/dL (ref <1.2)
Total Protein: 6.2 g/dL — ABNORMAL LOW (ref 6.5–8.1)

## 2023-05-12 LAB — PHOSPHORUS: Phosphorus: 2.9 mg/dL (ref 2.5–4.6)

## 2023-05-12 LAB — MAGNESIUM: Magnesium: 2.1 mg/dL (ref 1.7–2.4)

## 2023-05-12 MED ORDER — FOOD THICKENER (SIMPLYTHICK): 10.0000 | ORAL | Status: DC | PRN

## 2023-05-12 MED ORDER — SODIUM CHLORIDE 0.9 % IV SOLN
1.0000 g | INTRAVENOUS | Status: DC
  Administered 2023-05-12 – 2023-05-14 (×3): 1 g via INTRAVENOUS
  Filled 2023-05-12 (×3): qty 10

## 2023-05-12 MED ORDER — CHLORHEXIDINE GLUCONATE CLOTH 2 % EX PADS
6.0000 | MEDICATED_PAD | Freq: Every day | CUTANEOUS | Status: DC
Start: 2023-05-12 — End: 2023-05-16
  Administered 2023-05-13 – 2023-05-16 (×4): 6 via TOPICAL

## 2023-05-12 MED ORDER — SODIUM CHLORIDE 0.9 % IV SOLN: INTRAVENOUS | Status: DC

## 2023-05-12 MED ORDER — DEXTROSE 5 % IV SOLN: 100.0000 mg | Freq: Once | INTRAVENOUS | Status: DC

## 2023-05-12 MED ORDER — AMIODARONE IV BOLUS ONLY 150 MG/100ML
150.0000 mg | Freq: Once | INTRAVENOUS | Status: DC
  Filled 2023-05-12: qty 100

## 2023-05-12 MED ORDER — METOPROLOL TARTRATE 5 MG/5ML IV SOLN
5.0000 mg | Freq: Once | INTRAVENOUS | Status: AC
  Administered 2023-05-12: 5 mg via INTRAVENOUS
  Filled 2023-05-12: qty 5

## 2023-05-12 MED ORDER — ENOXAPARIN SODIUM 40 MG/0.4ML IJ SOSY
40.0000 mg | PREFILLED_SYRINGE | INTRAMUSCULAR | Status: DC
  Filled 2023-05-12: qty 0.4

## 2023-05-12 MED ORDER — PROCHLORPERAZINE EDISYLATE 10 MG/2ML IJ SOLN: 5.0000 mg | INTRAMUSCULAR | Status: DC | PRN

## 2023-05-12 MED ORDER — FINASTERIDE 5 MG PO TABS
5.0000 mg | ORAL_TABLET | Freq: Every day | ORAL | Status: DC
  Administered 2023-05-13 – 2023-05-16 (×4): 5 mg via ORAL
  Filled 2023-05-12 (×4): qty 1

## 2023-05-12 MED ORDER — ENOXAPARIN SODIUM 30 MG/0.3ML IJ SOSY
30.0000 mg | PREFILLED_SYRINGE | INTRAMUSCULAR | Status: DC
Start: 2023-05-12 — End: 2023-05-12

## 2023-05-12 MED ORDER — AMIODARONE HCL 200 MG PO TABS
100.0000 mg | ORAL_TABLET | Freq: Every day | ORAL | Status: DC
  Administered 2023-05-13 – 2023-05-16 (×4): 100 mg via ORAL
  Filled 2023-05-12 (×4): qty 1

## 2023-05-12 MED ORDER — ACETAMINOPHEN 325 MG PO TABS: 650.0000 mg | ORAL_TABLET | Freq: Four times a day (QID) | ORAL | Status: DC | PRN

## 2023-05-12 MED ORDER — ONDANSETRON HCL 4 MG/2ML IJ SOLN: 4.0000 mg | Freq: Four times a day (QID) | INTRAMUSCULAR | Status: DC | PRN

## 2023-05-12 MED ORDER — KETOROLAC TROMETHAMINE 15 MG/ML IJ SOLN
15.0000 mg | Freq: Once | INTRAMUSCULAR | Status: AC
  Administered 2023-05-12: 15 mg via INTRAVENOUS
  Filled 2023-05-12 (×2): qty 1

## 2023-05-12 MED ORDER — ENOXAPARIN SODIUM 40 MG/0.4ML IJ SOSY
40.0000 mg | PREFILLED_SYRINGE | INTRAMUSCULAR | Status: DC
  Administered 2023-05-13 – 2023-05-15 (×3): 40 mg via SUBCUTANEOUS
  Filled 2023-05-12 (×3): qty 0.4

## 2023-05-12 MED ORDER — IOHEXOL 300 MG/ML  SOLN
100.0000 mL | Freq: Once | INTRAMUSCULAR | Status: AC | PRN
  Administered 2023-05-12: 100 mL via INTRAVENOUS

## 2023-05-12 MED ORDER — FENTANYL CITRATE PF 50 MCG/ML IJ SOSY: 25.0000 ug | PREFILLED_SYRINGE | INTRAMUSCULAR | Status: DC | PRN

## 2023-05-12 MED ORDER — ACETAMINOPHEN 650 MG RE SUPP: 650.0000 mg | Freq: Four times a day (QID) | RECTAL | Status: DC | PRN

## 2023-05-12 NOTE — Plan of Care (Signed)

## 2023-05-12 NOTE — Plan of Care (Signed)

## 2023-05-12 NOTE — Progress Notes (Signed)
1652-  pt calm  sitting in chair  wife at bedside,  pt passed slp  and is on dyspahgia diet 3   nectar thick     no distress noted  safety measures in place call bell within reach  will relay all information to oncoming Rn at handoff

## 2023-05-12 NOTE — Evaluation (Signed)
Clinical/Bedside Swallow Evaluation Patient Details  Name: Raymond Fowler MRN: [id number] Date of Birth: 11-Sep-1932  Today's Date: 05/12/2023 Time: SLP Start Time (ACUTE ONLY): 1439 SLP Stop Time (ACUTE ONLY): 1458 SLP Time Calculation (min) (ACUTE ONLY): 19 min  Past Medical History:  Past Medical History:  Diagnosis Date   Bilateral hearing loss 07/29/2015   Bilateral impacted cerumen 07/29/2015   BPH (benign prostatic hyperplasia) 07/12/2021   Cardiac murmur 07/09/2021   Cerebrovascular disease 11/04/2016   Cochlear implant in place 02/24/2020   COVID-19 virus infection 07/12/2021   Diabetes mellitus without complication (HCC)    Diet-controlled diabetes mellitus (HCC) 07/09/2021   Dilated cardiomyopathy (HCC) 07/14/2021   Dizziness and giddiness 11/04/2016   Essential hypertension 07/06/2021   Frailty 07/06/2021   Hardening of the aorta (main artery of the heart) (HCC) 07/06/2021   Hypertension    Hypomagnesemia 07/12/2021   Hyponatremia 07/12/2021   Hypophosphatemia 07/13/2021   Normocytic anemia 07/12/2021   Osteoporosis 07/06/2021   Palpitations 07/09/2021   Pathological fracture of vertebra 07/06/2021   Peripheral vascular disorder due to diabetes mellitus (HCC) 08/23/2021   Protein calorie malnutrition (HCC) 07/06/2021   Protein-calorie malnutrition, severe 07/12/2021   Pure hypercholesterolemia 07/06/2021   Sensorineural hearing loss (SNHL) of both ears 02/27/2018   Subclinical hypothyroidism 07/13/2021   Supraventricular tachycardia (HCC) 07/09/2021   SVT (supraventricular tachycardia) (HCC) 07/11/2021   Thrombocytopenia (HCC) 07/12/2021   Type 2 diabetes mellitus with other circulatory complications (HCC) 07/06/2021   Wears hearing aid in right ear 03/10/2020   Past Surgical History:  Past Surgical History:  Procedure Laterality Date   APPENDECTOMY     CHOLECYSTECTOMY     COCHLEAR IMPLANT     EYE SURGERY     FRACTURE SURGERY     HERNIA REPAIR     TONSILLECTOMY     HPI:  Raymond Fowler is an 89  y.o. male who presented to the emergency department with chest pain after getting a piece of meat stuck in his esophagus. Dx SIRS, odynophagia.  PMHx bilateral hearing loss, bilateral cerumen impaction, cochlear implant, BPH, chronic indwelling Foley catheter, cardiac murmur, chronic combined systolic and diastolic heart failure, cerebrovascular disease, COVID-19 virus infection, type 2 diabetes, dilated cardiomyopathy, dizziness, palpitations, SVT, hypertension, aortic atherosclerosis, hypomagnesemia, hyponatremia hypophosphatemia, normocytic anemia, osteoporosis, pathological fracture of vertebra, PVD, protein calorie malnutrition.  He has had a 40 pound unintentional weight loss since last year.    Assessment / Plan / Recommendation  Clinical Impression  Pt presents with signs of a pharyngeal and/or esophageal dysphagia with notable coughing after most thin liquid swallows, concerning for aspiration.  Cough is weak, lacks normal abrupt onset.  There are multiple sub-swallows associated with all bolus consistencies, suggesting potential pharyngeal weakness, reduced UES opening, or poor transfer through esophagus. Nectar thick liquids were consumed without eliciting a cough. For now, recommend starting a dysphagia 3 diet with nectar thick liquids. Pt would benefit from an MBS while admitted. D/W pt, his wife, and nurse.  SLP will follow. SLP Visit Diagnosis: Dysphagia, pharyngoesophageal phase (R13.14)    Aspiration Risk    unknown   Diet Recommendation   Nectar;Dysphagia 3 (mechanical soft)  Medication Administration: Whole meds with puree    Other  Recommendations Oral Care Recommendations: Oral care BID    Recommendations for follow up therapy are one component of a multi-disciplinary discharge planning process, led by the attending physician.  Recommendations may be updated based on patient status, additional functional criteria and insurance authorization.  Follow  up Recommendations Other  (comment) (tba)          Functional Status Assessment Patient has had a recent decline in their functional status and demonstrates the ability to make significant improvements in function in a reasonable and predictable amount of time.  Frequency and Duration            Prognosis Prognosis for improved oropharyngeal function: Good      Swallow Study   General Date of Onset: 05/11/23 HPI: Raymond Fowler is an 89 y.o. male who presented to the emergency department with chest pain after getting a piece of meat stuck in his esophagus. Dx SIRS, odynophagia.  PMHx bilateral hearing loss, bilateral cerumen impaction, cochlear implant, BPH, chronic indwelling Foley catheter, cardiac murmur, chronic combined systolic and diastolic heart failure, cerebrovascular disease, COVID-19 virus infection, type 2 diabetes, dilated cardiomyopathy, dizziness, palpitations, SVT, hypertension, aortic atherosclerosis, hypomagnesemia, hyponatremia hypophosphatemia, normocytic anemia, osteoporosis, pathological fracture of vertebra, PVD, protein calorie malnutrition.  He has had a 40 pound unintentional weight loss since last year. Type of Study: Bedside Swallow Evaluation Previous Swallow Assessment: no Diet Prior to this Study: NPO Temperature Spikes Noted: No Respiratory Status: Room air History of Recent Intubation: No Behavior/Cognition: Alert;Cooperative Oral Cavity Assessment: Within Functional Limits Oral Care Completed by SLP: No Oral Cavity - Dentition: Missing dentition Vision: Functional for self-feeding Self-Feeding Abilities: Able to feed self Patient Positioning: Upright in bed Baseline Vocal Quality: Normal Volitional Cough: Weak Volitional Swallow: Able to elicit    Oral/Motor/Sensory Function Overall Oral Motor/Sensory Function: Within functional limits   Ice Chips Ice chips: Within functional limits   Thin Liquid Thin Liquid: Impaired Presentation: Cup;Straw Pharyngeal  Phase Impairments:  Multiple swallows;Cough - Immediate    Nectar Thick Nectar Thick Liquid: Impaired Presentation: Cup;Straw Pharyngeal Phase Impairments: Multiple swallows   Honey Thick Honey Thick Liquid: Not tested   Puree Puree: Impaired Pharyngeal Phase Impairments: Multiple swallows   Solid     Solid: Impaired Pharyngeal Phase Impairments: Multiple swallows      Blenda Mounts Laurice 05/12/2023,3:05 PM  Roena Sassaman L. Samson Frederic, MA CCC/SLP Clinical Specialist - Acute Care SLP Acute Rehabilitation Services Office number [phone]

## 2023-05-12 NOTE — Plan of Care (Signed)

## 2023-05-12 NOTE — Progress Notes (Signed)
Pt had ct  completed,  vs in chart  family at bedside  pt medicated per order for pain and elevated bp   will recheck bp and pain for effectiveness and document in chart

## 2023-05-12 NOTE — H&P (Signed)
History and Physical    Patient: Raymond Fowler ZOX:[id number] DOB: 1933-04-04 DOA: 05/11/2023 DOS: the patient was seen and examined on 05/12/2023 PCP: Joycelyn Rua, MD  Patient coming from: Home  Chief Complaint:  Chief Complaint  Patient presents with   Chest Pain   HPI: Raymond Fowler is a 89 y.o. male with medical history significant of bilateral hearing loss, bilateral cerumen impaction, cochlear implant, BPH, chronic indwelling Foley catheter, cardiac murmur, chronic combined systolic and diastolic heart failure, cerebrovascular disease, COVID-19 virus infection, type 2 diabetes, dilated cardiomyopathy, dizziness, palpitations, SVT, hypertension, aortic atherosclerosis, hypomagnesemia, hyponatremia hypophosphatemia, normocytic anemia, osteoporosis, pathological fracture of vertebra, PVD, protein calorie malnutrition, subclinical hypothyroidism, thrombocytopenia who presented to the emergency department with chest pain after getting a piece of meat stuck in his esophagus.  He has had a 40 pound unintentional weight loss since last year. He denied chills, sore throat, wheezing or hemoptysis.  No  palpitations, diaphoresis, PND, orthopnea or pitting edema of the lower extremities.  No abdominal pain, nausea, emesis, diarrhea, constipation, melena or hematochezia.  No flank pain or hematuria.  No polyuria, polydipsia, polyphagia or blurred vision.   Lab work: Urinalysis was cloudy with small hemoglobin and moderate leukocyte esterase.  There was 100 mg of protein.  11-20 WBC with many bacteria.  Coronavirus, influenza and RSV PCR was negative.  CBC showed white count 17.7, hemoglobin 13.8 g/dL platelets 811.  BMP showed a sodium 131, potassium 4.5, chloride 96 and CO2 25 mmol/L.  Glucose 319, BUN 19, creatinine 1.27 and calcium 9.8 mg/dL.  Imaging: Portable 1 view chest radiograph with no acute intrathoracic process.  If food impaction remains a concern esophagram or all right checks x-ray after  oral contrast ingestion should be performed.   ED course: Initial vital signs were temperature 98.1 F, pulse 94, respirations 16, BP 156/105 mmHg O2 sat 100% on room air.  The patient received azithromycin 500 mg IVPB, ceftriaxone 1 g IVPB and Maalox plus viscous Xylocaine 2%.  Review of Systems: As mentioned in the history of present illness. All other systems reviewed and are negative. Past Medical History:  Diagnosis Date   Bilateral hearing loss 07/29/2015   Bilateral impacted cerumen 07/29/2015   BPH (benign prostatic hyperplasia) 07/12/2021   Cardiac murmur 07/09/2021   Cerebrovascular disease 11/04/2016   Cochlear implant in place 02/24/2020   COVID-19 virus infection 07/12/2021   Diabetes mellitus without complication (HCC)    Diet-controlled diabetes mellitus (HCC) 07/09/2021   Dilated cardiomyopathy (HCC) 07/14/2021   Dizziness and giddiness 11/04/2016   Essential hypertension 07/06/2021   Frailty 07/06/2021   Hardening of the aorta (main artery of the heart) (HCC) 07/06/2021   Hypertension    Hypomagnesemia 07/12/2021   Hyponatremia 07/12/2021   Hypophosphatemia 07/13/2021   Normocytic anemia 07/12/2021   Osteoporosis 07/06/2021   Palpitations 07/09/2021   Pathological fracture of vertebra 07/06/2021   Peripheral vascular disorder due to diabetes mellitus (HCC) 08/23/2021   Protein calorie malnutrition (HCC) 07/06/2021   Protein-calorie malnutrition, severe 07/12/2021   Pure hypercholesterolemia 07/06/2021   Sensorineural hearing loss (SNHL) of both ears 02/27/2018   Subclinical hypothyroidism 07/13/2021   Supraventricular tachycardia (HCC) 07/09/2021   SVT (supraventricular tachycardia) (HCC) 07/11/2021   Thrombocytopenia (HCC) 07/12/2021   Type 2 diabetes mellitus with other circulatory complications (HCC) 07/06/2021   Wears hearing aid in right ear 03/10/2020   Past Surgical History:  Procedure Laterality Date   APPENDECTOMY     CHOLECYSTECTOMY     COCHLEAR IMPLANT  EYE SURGERY     FRACTURE  SURGERY     HERNIA REPAIR     TONSILLECTOMY     Social History:  reports that he has never smoked. He has never used smokeless tobacco. He reports current alcohol use. He reports that he does not use drugs.  No Known Allergies  Family History  Problem Relation Age of Onset   Diabetes Sister     Prior to Admission medications   Medication Sig Start Date End Date Taking? Authorizing Provider  alendronate (FOSAMAX) 70 MG tablet Take 70 mg by mouth once a week. 06/16/21   [provider]  amiodarone (PACERONE) 100 MG tablet Take 1 tablet (100 mg total) by mouth daily. 02/27/23   Revankar, Aundra Dubin, MD  Ascorbic Acid (VITAMIN C) 1000 MG tablet Take 1,000 mg by mouth daily.    [provider]  aspirin EC 325 MG tablet Take 325 mg by mouth daily.    [provider]  atorvastatin (LIPITOR) 10 MG tablet Take 1 tablet (10 mg total) by mouth every evening. 08/31/21   Revankar, Aundra Dubin, MD  brimonidine (ALPHAGAN) 0.2 % ophthalmic solution Place 1 drop into both eyes 2 (two) times daily. 05/29/15   [provider]  carboxymethylcellul-glycerin (REFRESH OPTIVE) 0.5-0.9 % ophthalmic solution Place 1 drop into both eyes 2 (two) times daily.    [provider]  finasteride (PROSCAR) 5 MG tablet Take 5 mg by mouth daily. 04/19/21   [provider]  levothyroxine (SYNTHROID) 50 MCG tablet Take 50 mcg by mouth daily before breakfast. 10/04/22   [provider]  liothyronine (CYTOMEL) 25 MCG tablet Take 25 mcg by mouth daily. 02/21/23   [provider]  metoprolol succinate (TOPROL-XL) 25 MG 24 hr tablet Take 0.5 tablets (12.5 mg total) by mouth daily. Take with or immediately following a meal. 01/17/22 03/16/23  Revankar, Aundra Dubin, MD  Multiple Vitamins-Minerals (MULTIVITAMIN WITH MINERALS) tablet Take 1 tablet by mouth daily.    [provider]  Omega-3 Fatty Acids (FISH OIL) 1200 MG CAPS Take 1,200 mg by mouth daily.    [provider]  polyethylene glycol (MIRALAX) 17 g packet Take 17 g by mouth daily. 03/16/23   Tegeler, Canary Brim, MD  sacubitril-valsartan (ENTRESTO) 24-26 MG Take 1 tablet by mouth 2 (two) times daily. 02/09/23   RevankarAundra Dubin, MD    Physical Exam: Vitals:   05/11/23 2330 05/11/23 2343 05/12/23 0106 05/12/23 0543  BP: (!) 150/75  (!) 156/75 (!) 145/63  Pulse: 67  73 72  Resp: 19  20 20   Temp:  99.2 F (37.3 C) 98.2 F (36.8 C) 98.6 F (37 C)  TempSrc:  Oral Oral Oral  SpO2: 98%  100% 96%  Weight:      Height:       Physical Exam Vitals and nursing note reviewed.  Constitutional:      General: He is awake. He is not in acute distress.    Appearance: He is well-developed. He is ill-appearing.  HENT:     Head: Normocephalic.     Nose: No rhinorrhea.     Mouth/Throat:     Mouth: Mucous membranes are moist.  Eyes:     General: No scleral icterus.    Pupils: Pupils are equal, round, and reactive to light.  Neck:     Vascular: No JVD.  Cardiovascular:     Rate and Rhythm: Normal rate and regular rhythm.     Heart sounds:  S1 normal and S2 normal.  Pulmonary:     Effort: Pulmonary effort is normal. No tachypnea.     Breath sounds: Normal breath sounds. No wheezing, rhonchi or rales.  Abdominal:     General: Bowel sounds are normal.     Palpations: Abdomen is soft.  Musculoskeletal:     Cervical back: Neck supple.     Right lower leg: No edema.  Skin:    General: Skin is warm and dry.  Neurological:     General: No focal deficit present.     Mental Status: He is alert and oriented to person, place, and time.  Psychiatric:        Mood and Affect: Mood normal.        Behavior: Behavior normal. Behavior is cooperative.     Data Reviewed:  Results are pending, will review when available. 01/2023 Echocardiogram results IMPRESSIONS:   1. Left ventricular ejection fraction, by estimation, is 35 to 40%. Left  ventricular ejection fraction by 3D volume is 36 %.  Left ventricular  ejection fraction by 2D MOD biplane is 37.7 %. The left ventricle has  moderately decreased function. The left  ventricle demonstrates regional wall motion abnormalities (see scoring  diagram/findings for description). Left ventricular diastolic parameters  are consistent with Grade I diastolic dysfunction (impaired relaxation).   2. Right ventricular systolic function is normal. The right ventricular  size is normal. Tricuspid regurgitation signal is inadequate for assessing  PA pressure.   3. The mitral valve is degenerative. Mild mitral valve regurgitation. No  evidence of mitral stenosis.   4. The aortic valve is tricuspid. Aortic valve regurgitation is mild.  Aortic valve sclerosis is present, with no evidence of aortic valve  stenosis.   5. The inferior vena cava is normal in size with greater than 50%  respiratory variability, suggesting right atrial pressure of 3 mmHg.   EKG: Vent. rate 71 BPM PR interval 182 ms QRS duration 92 ms QT/QTcB 444/482 ms P-R-T axes 45 -12 98 Normal sinus rhythm Left ventricular hypertrophy with repolarization abnormality  ( Sokolow-Lyon , Cornell product , Romhilt-Estes ) Cannot rule out Septal infarct , age undetermined Abnormal ECG No previous ECGs available  Assessment and Plan: Principal Problem:   SIRS (systemic inflammatory response syndrome) (HCC) UTI vs PNA initially suspected in the ED. Admit to telemetry/inpatient. Continue IV fluids. Continue ceftriaxone 1 g every 24 hours. Will add doxycycline if needed after CT results. Follow-up blood culture and sensitivity Follow CBC and CMP in a.m.  Active Problems:   Unintentional weight loss And    Mild protein malnutrition (HCC) Given fever, odynophagia and weight loss. -Will obtain CT chest/abdomen/pelvis.    Atypical chest pain Associated with:   Odynophagia SLP evaluation requested. Esophagogram as needed. CT scan as above. Will consult GI as  needed.    Supraventricular tachycardia (HCC) Metoprolol 12.5 mg IVP now. Resume amiodarone if cleared by SLP. -Otherwise will use parenteral amiodarone.    Chronic combined systolic and diastolic congestive heart failure (HCC)  Beta-blocker given parenterally today. Resume Entresto 24-26 mg p.o. twice daily once cleared for PO.    Hyponatremia Minimal and chronic. Seems to be euvolemic. Follow-up sodium level in AM.    Normocytic anemia Monitor hematocrit and hemoglobin. Check for stool occult blood.    Essential hypertension Continue metoprolol succinate 12.5 mg p.o. daily.    Bilateral hearing loss Supportive care.    Diet-controlled diabetes mellitus (HCC) Currently NPO. CBG monitoring every 6 hours. Check  hemoglobin A1c.    BPH (benign prostatic hyperplasia) On finasteride. Will resume after SLP evaluation if no restrictions.   Advance Care Planning:   Code Status: Full Code   Consults: SLP.  Family Communication: His wife was at bedside.  Severity of Illness: The appropriate patient status for this patient is OBSERVATION. Observation status is judged to be reasonable and necessary in order to provide the required intensity of service to ensure the patient's safety. The patient's presenting symptoms, physical exam findings, and initial radiographic and laboratory data in the context of their medical condition is felt to place them at decreased risk for further clinical deterioration. Furthermore, it is anticipated that the patient will be medically stable for discharge from the hospital within 2 midnights of admission.   Author: Bobette Mo, MD 05/12/2023 7:53 AM  For on call review [email or web address].   This document was prepared using Dragon voice recognition software and may contain some unintended transcription errors.

## 2023-05-12 NOTE — Evaluation (Signed)
Physical Therapy Evaluation Patient Details Name: Raymond Fowler MRN: [id number] DOB: 1932-06-07 Today's Date: 05/12/2023  History of Present Illness  89 yo male presents to therapy following hospital admission 05/11/2023 due to R side chest pain following meal on 12/11 in which pt reports feeling as if food was stuck in his esophagus. Pt had episode of recurrent R side chest pain following lunch 12/12 and presented to ED. Pt PMH includes but is not limited to: CVA, DM II, HTN, B HOH with cochlear implant, OP, malnutrition, SVT, HTN, and heart failure.  Clinical Impression      Pt admitted with above diagnosis.  Pt currently with functional limitations due to the deficits listed below (see PT Problem List). Pt in bed when PT arrived. Pt very HOH, R hearing aid donned and spouse indicated L cochlear implant keeps falling off in bed and spouse able to assist with communication. Pt required min A for bed mobility, sit to stand  and transfers with L HHA and gait tasks in room 15 feet x 2 with L HHA and min A. Per spouse pt has not had any falls, occasionally c/o light headedness, please see below for Bp findings. Pt reported no pain or chest discomfort at time of eval, no SOB or dizziness. Pt left seated in recliner, all needs in place and remains NPO at this time. Pt will benefit from acute skilled PT to increase their independence and safety with mobility to allow discharge.   Bp  semi reclined at rest 154/66 (70 PR) Bp  seated EOB 136/87 (80 PR) Bp initial standing 121/80 (97 PR) no reports of light headedness     If plan is discharge home, recommend the following: A little help with walking and/or transfers;A little help with bathing/dressing/bathroom;Assistance with cooking/housework;Assist for transportation;Help with stairs or ramp for entrance   Can travel by private vehicle        Equipment Recommendations None recommended by PT  Recommendations for Other Services       Functional Status  Assessment Patient has had a recent decline in their functional status and demonstrates the ability to make significant improvements in function in a reasonable and predictable amount of time.     Precautions / Restrictions Precautions Precautions: Fall (HOH) Restrictions Weight Bearing Restrictions Per Provider Order: No      Mobility  Bed Mobility Overal bed mobility: Needs Assistance Bed Mobility: Supine to Sit     Supine to sit: Min assist, HOB elevated     General bed mobility comments: min cues, increased time and assist for B LE and trunk with HOB elevated    Transfers Overall transfer level: Needs assistance Equipment used: 1 person hand held assist Transfers: Sit to/from Stand Sit to Stand: Min assist           General transfer comment: cues and L HHA for sit to stand from EOB and from bench in personal room, cues for proper alignment with sitting surface and reaching for arm rest to improve eccentric control    Ambulation/Gait Ambulation/Gait assistance: Min assist Gait Distance (Feet): 15 Feet Assistive device: 1 person hand held assist Gait Pattern/deviations: Step-to pattern, Decreased step length - right, Decreased step length - left, Shuffle, Knee flexed in stance - right, Knee flexed in stance - left, Festinating, Narrow base of support Gait velocity: decreased     General Gait Details: pt exhibits shuffling PD-like gait pattern with B knee, hip and trunk flexion throughout gait cycle with slow cadence, L HHA  at baseline  Careers information officer     Tilt Bed    Modified Rankin (Stroke Patients Only)       Balance Overall balance assessment: Needs assistance (spouse reports no falls) Sitting-balance support: Feet supported, Bilateral upper extremity supported Sitting balance-Leahy Scale: Fair     Standing balance support: Single extremity supported, During functional activity Standing balance-Leahy Scale: Poor                                Pertinent Vitals/Pain Pain Assessment Pain Assessment: No/denies pain    Home Living Family/patient expects to be discharged to:: Private residence Living Arrangements: Spouse/significant other Available Help at Discharge: Family Type of Home: House Home Access: Stairs to enter Entrance Stairs-Rails: None Entrance Stairs-Number of Steps: 1   Home Layout: Two level;Able to live on main level with bedroom/bathroom Home Equipment: Cane - single point;Rolling Walker (2 wheels)      Prior Function Prior Level of Function : Needs assist       Physical Assist : Mobility (physical);ADLs (physical) Mobility (physical): Gait;Stairs;Transfers;Bed mobility ADLs (physical): Bathing;Dressing;Toileting;IADLs Mobility Comments: spouse reports HHA for gait tasks in home and community, pt has not been safe with trials using RW in past, pt required min to mod A for transfers and bed mobiltiy at baseline, assist with step navigation to enter home ADLs Comments: pt requires A for upper body and lower body dressing as well as set up and occational assist for sponge bathing seated     Extremity/Trunk Assessment   Upper Extremity Assessment Upper Extremity Assessment: Defer to OT evaluation    Lower Extremity Assessment Lower Extremity Assessment: Generalized weakness    Cervical / Trunk Assessment Cervical / Trunk Assessment: Kyphotic  Communication   Communication Communication: Hearing impairment (R hearing aid L choclear implant) Cueing Techniques: Verbal cues;Gestural cues;Tactile cues;Visual cues  Cognition Arousal: Alert Behavior During Therapy: WFL for tasks assessed/performed Overall Cognitive Status: Within Functional Limits for tasks assessed                                          General Comments      Exercises     Assessment/Plan    PT Assessment Patient needs continued PT services  PT Problem List Decreased  strength;Decreased range of motion;Decreased activity tolerance;Decreased balance;Decreased mobility;Decreased coordination       PT Treatment Interventions DME instruction;Gait training;Stair training;Functional mobility training;Therapeutic activities;Therapeutic exercise;Balance training;Neuromuscular re-education;Patient/family education    PT Goals (Current goals can be found in the Care Plan section)  Acute Rehab PT Goals Patient Stated Goal: to be able to improve gait and go home PT Goal Formulation: With patient/family Time For Goal Achievement: 05/26/23 Potential to Achieve Goals: Good    Frequency Min 1X/week     Co-evaluation               AM-PAC PT "6 Clicks" Mobility  Outcome Measure Help needed turning from your back to your side while in a flat bed without using bedrails?: A Little Help needed moving from lying on your back to sitting on the side of a flat bed without using bedrails?: A Little Help needed moving to and from a bed to a chair (including a wheelchair)?: A Little Help needed standing up from a  chair using your arms (e.g., wheelchair or bedside chair)?: A Little Help needed to walk in hospital room?: A Little Help needed climbing 3-5 steps with a railing? : A Lot 6 Click Score: 17    End of Session Equipment Utilized During Treatment: Gait belt Activity Tolerance: Patient tolerated treatment well Patient left: in chair;with call bell/phone within reach;with chair alarm set;with family/visitor present Nurse Communication: Mobility status PT Visit Diagnosis: Unsteadiness on feet (R26.81);Other abnormalities of gait and mobility (R26.89);Muscle weakness (generalized) (M62.81);Difficulty in walking, not elsewhere classified (R26.2)    Time: 4098-1191 PT Time Calculation (min) (ACUTE ONLY): 28 min   Charges:   PT Evaluation $PT Eval Low Complexity: 1 Low PT Treatments $Gait Training: 8-22 mins PT General Charges $$ ACUTE PT VISIT: 1 Visit          Johnny Bridge, PT Acute Rehab   Jacqualyn Posey 05/12/2023, 4:46 PM

## 2023-05-12 NOTE — Consult Note (Signed)
Referring Provider: Dr. Robb Matar Primary Care Physician:  Joycelyn Rua, MD Primary Gastroenterologist:  Gentry Fitz  Reason for Consultation:  Dysphagia; Weight loss  HPI: Raymond Fowler is a 89 y.o. male with multiple medical problems seen for a consultation due to an episode of food hanging up yesterday that was slow to pass. His wife gives most of the history and he is very hard of hearing. She reports sporadic episodes of choking with eating and otherwise he has been eating ok except for yesterday when pork hung up. It spontaneously passed but he had chest pain with it and following clearance of the sensation in his throat. Has lost 40 pounds unintentionally in the past 2 years. Denies abdominal pain. Chronic  constipation. Abd/pelvis CT pending. No acute findings on abd/pelvis CT in 10/24.  Past Medical History:  Diagnosis Date   Bilateral hearing loss 07/29/2015   Bilateral impacted cerumen 07/29/2015   BPH (benign prostatic hyperplasia) 07/12/2021   Cardiac murmur 07/09/2021   Cerebrovascular disease 11/04/2016   Cochlear implant in place 02/24/2020   COVID-19 virus infection 07/12/2021   Diabetes mellitus without complication (HCC)    Diet-controlled diabetes mellitus (HCC) 07/09/2021   Dilated cardiomyopathy (HCC) 07/14/2021   Dizziness and giddiness 11/04/2016   Essential hypertension 07/06/2021   Frailty 07/06/2021   Hardening of the aorta (main artery of the heart) (HCC) 07/06/2021   Hypertension    Hypomagnesemia 07/12/2021   Hyponatremia 07/12/2021   Hypophosphatemia 07/13/2021   Normocytic anemia 07/12/2021   Osteoporosis 07/06/2021   Palpitations 07/09/2021   Pathological fracture of vertebra 07/06/2021   Peripheral vascular disorder due to diabetes mellitus (HCC) 08/23/2021   Protein calorie malnutrition (HCC) 07/06/2021   Protein-calorie malnutrition, severe 07/12/2021   Pure hypercholesterolemia 07/06/2021   Sensorineural hearing loss (SNHL) of both ears 02/27/2018   Subclinical hypothyroidism  07/13/2021   Supraventricular tachycardia (HCC) 07/09/2021   SVT (supraventricular tachycardia) (HCC) 07/11/2021   Thrombocytopenia (HCC) 07/12/2021   Type 2 diabetes mellitus with other circulatory complications (HCC) 07/06/2021   Wears hearing aid in right ear 03/10/2020    Past Surgical History:  Procedure Laterality Date   APPENDECTOMY     CHOLECYSTECTOMY     COCHLEAR IMPLANT     EYE SURGERY     FRACTURE SURGERY     HERNIA REPAIR     TONSILLECTOMY      Prior to Admission medications   Medication Sig Start Date End Date Taking? Authorizing Provider  alendronate (FOSAMAX) 70 MG tablet Take 70 mg by mouth once a week. 06/16/21  Yes [provider]  amiodarone (PACERONE) 100 MG tablet Take 1 tablet (100 mg total) by mouth daily. 02/27/23  Yes Revankar, Aundra Dubin, MD  Ascorbic Acid (VITAMIN C) 1000 MG tablet Take 1,000 mg by mouth daily.   Yes [provider]  aspirin EC 325 MG tablet Take 325 mg by mouth daily.   Yes [provider]  atorvastatin (LIPITOR) 10 MG tablet Take 1 tablet (10 mg total) by mouth every evening. 08/31/21  Yes Revankar, Aundra Dubin, MD  brimonidine (ALPHAGAN) 0.2 % ophthalmic solution Place 1 drop into both eyes 2 (two) times daily. 05/29/15  Yes [provider]  carboxymethylcellul-glycerin (REFRESH OPTIVE) 0.5-0.9 % ophthalmic solution Place 1 drop into both eyes 2 (two) times daily.   Yes [provider]  finasteride (PROSCAR) 5 MG tablet Take 5 mg by mouth daily. 04/19/21  Yes [provider]  GEMTESA 75 MG TABS Take 75 mg by mouth daily.  Yes [provider]  levothyroxine (SYNTHROID) 50 MCG tablet Take 50 mcg by mouth daily before breakfast. 10/04/22  Yes [provider]  liothyronine (CYTOMEL) 25 MCG tablet Take 25 mcg by mouth daily. 02/21/23  Yes [provider]  metoprolol succinate (TOPROL-XL) 25 MG 24 hr tablet Take 0.5 tablets (12.5 mg total) by mouth daily. Take with or immediately  following a meal. 01/17/22 05/12/23 Yes Revankar, Aundra Dubin, MD  Multiple Vitamins-Minerals (MULTIVITAMIN WITH MINERALS) tablet Take 1 tablet by mouth daily.   Yes [provider]  Omega-3 Fatty Acids (FISH OIL) 1200 MG CAPS Take 1,200 mg by mouth daily.   Yes [provider]  polyethylene glycol (MIRALAX) 17 g packet Take 17 g by mouth daily. 03/16/23  Yes Tegeler, Canary Brim, MD  sacubitril-valsartan (ENTRESTO) 24-26 MG Take 1 tablet by mouth 2 (two) times daily. 02/09/23  Yes Revankar, Aundra Dubin, MD    Scheduled Meds:  amiodarone  100 mg Oral Daily   [START ON 05/13/2023] enoxaparin (LOVENOX) injection  40 mg Subcutaneous Q24H   finasteride  5 mg Oral Daily   Continuous Infusions:  sodium chloride 75 mL/hr at 05/12/23 0549   cefTRIAXone (ROCEPHIN)  IV     PRN Meds:.acetaminophen **OR** acetaminophen, fentaNYL (SUBLIMAZE) injection, food thickener, prochlorperazine  Allergies as of 05/11/2023   (No Known Allergies)    Family History  Problem Relation Age of Onset   Diabetes Sister     Social History   Socioeconomic History   Marital status: Married    Spouse name: Alvira Philips   Number of children: 2   Years of education: Not on file   Highest education level: Not on file  Occupational History   Occupation: Retired  Tobacco Use   Smoking status: Never   Smokeless tobacco: Never  Vaping Use   Vaping status: Never Used  Substance and Sexual Activity   Alcohol use: Yes    Comment: rare   Drug use: No   Sexual activity: Not Currently  Other Topics Concern   Not on file  Social History Narrative   Lives with wife   Caffeine use: 4 cups daily   Right handed   Social Drivers of Health   Financial Resource Strain: Not on file  Food Insecurity: No Food Insecurity (05/12/2023)   Hunger Vital Sign    Worried About Running Out of Food in the Last Year: Never true    Ran Out of Food in the Last Year: Never true  Transportation Needs: No Transportation  Needs (05/12/2023)   PRAPARE - Administrator, Civil Service (Medical): No    Lack of Transportation (Non-Medical): No  Physical Activity: Not on file  Stress: Not on file  Social Connections: Not on file  Intimate Partner Violence: Not At Risk (05/12/2023)   Humiliation, Afraid, Rape, and Kick questionnaire    Fear of Current or Ex-Partner: No    Emotionally Abused: No    Physically Abused: No    Sexually Abused: No    Review of Systems: All negative except as stated above in HPI.  Physical Exam: Vital signs: Vitals:   05/12/23 1405 05/12/23 1447  BP: (!) 154/83 131/75  Pulse: 75 71  Resp:    Temp: 98 F (36.7 C)   SpO2:     R 15, O2 sat 97% on RA  Last BM Date : 05/10/23 General:   cachetic, chronically ill-appearing, hard of hearing, lethargic, no acute distress, pleasant  Head: normocephalic, atraumatic Eyes:  anicteric sclera ENT: oropharynx clear Neck: supple, nontender Lungs:  right chest wall tenderness, Clear throughout to auscultation.   No wheezes, crackles, or rhonchi. No acute distress. Heart:  Regular rate and rhythm; no murmurs, clicks, rubs,  or gallops. Abdomen: soft, nontender, nondistended, +BS  Rectal:  Deferred Ext: no edema  GI:  Lab Results: Recent Labs    05/11/23 1504 05/12/23 0557  WBC 17.7* 20.1*  HGB 13.8 12.9*  HCT 40.6 38.5*  PLT 265 220   BMET Recent Labs    05/11/23 1504 05/12/23 0557  NA 131* 133*  K 4.5 3.8  CL 96* 100  CO2 25 24  GLUCOSE 217* 137*  BUN 19 19  CREATININE 1.27* 0.90  CALCIUM 8.8* 8.3*   LFT Recent Labs    05/12/23 0557  PROT 6.2*  ALBUMIN 3.2*  AST 19  ALT 15  ALKPHOS 37*  BILITOT 1.0  BILIDIR 0.2  IBILI 0.8   PT/INR No results for input(s): "LABPROT", "INR" in the last 72 hours.   Studies/Results: DG Chest Portable 1 View Result Date: 05/11/2023 CLINICAL DATA:  Chest pain, possible food impaction EXAM: PORTABLE CHEST 1 VIEW COMPARISON:  10/02/2022 FINDINGS: Upright  frontal view of the chest demonstrates a stable cardiac silhouette. Continued ectasia of the thoracic aorta. No airspace disease, effusion, or pneumothorax. Chronic scarring or atelectasis at the left lung base, with stable elevation of the left hemidiaphragm. No acute bony abnormalities. IMPRESSION: 1. No acute intrathoracic process. 2. If food impaction remains a clinical concern, esophagram or upright chest x-ray after oral contrast ingestion could be performed. Electronically Signed   By: Sharlet Salina M.D.   On: 05/11/2023 16:17    Impression/Plan: Failure to thrive with severe weight loss an episode of dysphagia yesterday with sensation of a possible food impaction that cleared spontaneously. Presentation concerning for malignancy and will await CT results. May need an EGD but await chest/abd/pelvis CT results. Speech path eval noted and dysphagia 3 diet planned. Modified barium swallow planned. Dr. Marca Ancona from our group to f/u tomorrow and decide on timing of EGD.    LOS: 0 days   Shirley Friar  05/12/2023, 3:30 PM  Questions please call [phone]

## 2023-05-13 ENCOUNTER — Inpatient Hospital Stay (HOSPITAL_COMMUNITY): Payer: Medicare Other

## 2023-05-13 DIAGNOSIS — R0789 Other chest pain: Secondary | ICD-10-CM

## 2023-05-13 DIAGNOSIS — I5042 Chronic combined systolic (congestive) and diastolic (congestive) heart failure: Secondary | ICD-10-CM | POA: Diagnosis not present

## 2023-05-13 DIAGNOSIS — R6510 Systemic inflammatory response syndrome (SIRS) of non-infectious origin without acute organ dysfunction: Secondary | ICD-10-CM | POA: Diagnosis not present

## 2023-05-13 DIAGNOSIS — H918X3 Other specified hearing loss, bilateral: Secondary | ICD-10-CM | POA: Diagnosis not present

## 2023-05-13 DIAGNOSIS — R634 Abnormal weight loss: Secondary | ICD-10-CM

## 2023-05-13 DIAGNOSIS — I10 Essential (primary) hypertension: Secondary | ICD-10-CM | POA: Diagnosis not present

## 2023-05-13 DIAGNOSIS — R1310 Dysphagia, unspecified: Secondary | ICD-10-CM | POA: Diagnosis not present

## 2023-05-13 DIAGNOSIS — E119 Type 2 diabetes mellitus without complications: Secondary | ICD-10-CM

## 2023-05-13 LAB — CBC
HCT: 35.6 % — ABNORMAL LOW (ref 39.0–52.0)
Hemoglobin: 12 g/dL — ABNORMAL LOW (ref 13.0–17.0)
MCH: 30.9 pg (ref 26.0–34.0)
MCHC: 33.7 g/dL (ref 30.0–36.0)
MCV: 91.8 fL (ref 80.0–100.0)
Platelets: 206 10*3/uL (ref 150–400)
RBC: 3.88 MIL/uL — ABNORMAL LOW (ref 4.22–5.81)
RDW: 13.6 % (ref 11.5–15.5)
WBC: 11.3 10*3/uL — ABNORMAL HIGH (ref 4.0–10.5)
nRBC: 0 % (ref 0.0–0.2)

## 2023-05-13 LAB — COMPREHENSIVE METABOLIC PANEL
ALT: 15 U/L (ref 0–44)
AST: 17 U/L (ref 15–41)
Albumin: 2.9 g/dL — ABNORMAL LOW (ref 3.5–5.0)
Alkaline Phosphatase: 39 U/L (ref 38–126)
Anion gap: 6 (ref 5–15)
BUN: 23 mg/dL (ref 8–23)
CO2: 24 mmol/L (ref 22–32)
Calcium: 8.1 mg/dL — ABNORMAL LOW (ref 8.9–10.3)
Chloride: 103 mmol/L (ref 98–111)
Creatinine, Ser: 1.07 mg/dL (ref 0.61–1.24)
GFR, Estimated: >60 mL/min (ref >60)
Glucose, Bld: 133 mg/dL — ABNORMAL HIGH (ref 70–99)
Potassium: 4.1 mmol/L (ref 3.5–5.1)
Sodium: 133 mmol/L — ABNORMAL LOW (ref 135–145)
Total Bilirubin: 0.7 mg/dL (ref <1.2)
Total Protein: 5.9 g/dL — ABNORMAL LOW (ref 6.5–8.1)

## 2023-05-13 LAB — GLUCOSE, CAPILLARY
Glucose-Capillary: 130 mg/dL — ABNORMAL HIGH (ref 70–99)
Glucose-Capillary: 131 mg/dL — ABNORMAL HIGH (ref 70–99)
Glucose-Capillary: 157 mg/dL — ABNORMAL HIGH (ref 70–99)
Glucose-Capillary: 176 mg/dL — ABNORMAL HIGH (ref 70–99)

## 2023-05-13 MED ORDER — POLYETHYLENE GLYCOL 3350 17 G PO PACK: 17.0000 g | PACK | Freq: Every day | ORAL | Status: DC | PRN

## 2023-05-13 MED ORDER — BISACODYL 5 MG PO TBEC: 5.0000 mg | DELAYED_RELEASE_TABLET | Freq: Every day | ORAL | Status: DC | PRN

## 2023-05-13 MED ORDER — BISACODYL 10 MG RE SUPP: 10.0000 mg | Freq: Every day | RECTAL | Status: DC | PRN

## 2023-05-13 MED ORDER — DOCUSATE SODIUM 100 MG PO CAPS: 100.0000 mg | ORAL_CAPSULE | Freq: Once | ORAL | Status: DC

## 2023-05-13 NOTE — Plan of Care (Signed)

## 2023-05-13 NOTE — Progress Notes (Addendum)
Speech Language Pathology Dysphagia Treatment Patient Details Name: Raymond Fowler MRN: [id number] DOB: 1932-08-10 Today's Date: 05/13/2023 Time: 2130-8657 SLP Time Calculation (min) (ACUTE ONLY): 23 min  Assessment / Plan / Recommendation Clinical Impression   SLP follow-up to educate wife to patient's MBS study given patient's level of hearing impairment.  Wife politely waited for SLP to arrive to room to review study.  In addition wife called granddaughter on the phone and SLP spoke to granddaughter while educating wife.  Reviewing fluoroscopy loops advised to patient's dysmotility and impaired laryngeal closure contributing to his dysphagia symptoms.  13 mm barium tablet remained in pharynx, vallecular space, despite multiple efforts to clear it into the esophagus and patient had no sensation to this occurrence.  Informed wife to this finding and recommended patient has medications crushed if larger than 13 mm or whole if very small with applesauce, start and follow with nectar thick liquid.  Education completed via fluoroscopy loops, verbally and with written instructions to patient, wife and granddaughter with minimal teach back cues.  Granddaughter reports patient has a weak cough at this time and inquired to exercises to help strengthen swallowing and cough.  Advised at this time we will request speech pathologist follow-up Monday or this SLP will follow-up Tuesday to provide expiratory muscle strength training and specific swallowing exercises to mitigate dysphagia and strengthen pharyngeal musculature.  Given patient's lack of sensation to tablet lodged in vallecular space, SLP questions if patient may have episodic aspiration of solids from vallecular retention.  Strict aspiration precautions is advised.    Regarding patient's left facial asymmetry, wife reports this to be baseline.  Patient's history of a stroke approximately 10 years ago did not result in any dysphagia that wife reports she  is aware of.    Please order home health SLP to address patient's dysphagia      Diet Recommendation    Dys3/pureed meats/nectar with meals;  thin water between meals   SLP Plan Continue with current plan of care   Pertinent Vitals/Pain afebrile   Swallowing Goals     General Behavior/Cognition: Alert;Cooperative;Pleasant mood Patient Positioning: Upright in chair/Tumbleform Oral care provided: N/A HPI: Raymond Fowler is an 89 y.o. male who presented to the emergency department with chest pain after getting a piece of meat stuck in his esophagus - per notes.  Radiologist advised barium swallow or CXR after swallowing contrast to determine if meat lodged as esophagus was normal on CT.  Dx SIRS, odynophagia.  PMHx bilateral hearing loss, bilateral cerumen impaction, cochlear implant, BPH, chronic indwelling Foley catheter, cardiac murmur, chronic combined systolic and diastolic heart failure, cerebrovascular disease, COVID-19 virus infection, type 2 diabetes, dilated cardiomyopathy, dizziness, palpitations, SVT, hypertension, aortic atherosclerosis, hypomagnesemia, hyponatremia hypophosphatemia, normocytic anemia, osteoporosis, pathological fracture of vertebra, PVD, protein calorie malnutrition.  He has had a 40 pound unintentional weight loss since last year.  Oral Cavity - Oral Hygiene     Dysphagia Treatment Family/Caregiver Educated: Spouse [name] Treatment Methods: Patient/caregiver education (Education with wife regarding MBS study as she was not able to come for swallow test) Patient observed directly with PO's: No Reason PO's not observed: Other (comment) (Education session) Feeding: Fed self    Raymond Fowler 05/13/2023, 8:14 PM    Rolena Infante, MS [facility] SLP Acute Rehab Services Office [phone]

## 2023-05-13 NOTE — Progress Notes (Addendum)
PROGRESS NOTE    Raymond Fowler  WUJ:[id number] DOB: 04/03/1933 DOA: 05/11/2023 PCP: Joycelyn Rua, MD    Brief Narrative:   Raymond Fowler is a 89 y.o. male with medical history significant of bilateral hearing loss,cochlear implant, BPH, chronic indwelling Foley catheter, chronic combined systolic and diastolic heart failure,  SVT, hypertension,PVD, protein calorie malnutrition, subclinical hypothyroidism, thrombocytopenia presented to the emergency department with chest pain after getting a piece of meat stuck in his esophagus.  He has had a 40 pound unintentional weight loss since last year.  In the ED, patient had slightly elevated blood pressure.  Lab showed mild leukocytosis at 17.7.  CMP showed mild hyponatremia at 131.  Creatinine slightly elevated at 1.2.  Urinalysis with some white cells in urine.  Chest x-ray without any acute findings.  In the ED, patient received azithromycin 500 mg IVPB, ceftriaxone 1 g IVPB and Maalox plus viscous Xylocaine 2% and was admitted hospital for further evaluation.  Assessment and Plan:    SIRS (systemic inflammatory response syndrome) Patient had leukocytosis on presentation with some tachypnea..  Concern for UTI versus pneumonia.  Continue Rocephin IV.  CT of the chest showed trace left effusion with compression deformities of C7 and T2 T4-T5-T6 and T7 which were chronic.  CT scan of the abdomen and pelvis with bibasilar atelectasis but no other source of infection. Continue ceftriaxone 1 g every 24 hours. :   Unintentional weight loss   Mild protein malnutrition  Has history of difficulty swallowing.  CT scan of the chest abdomen pelvis without any obvious findings except for compression deformities of the vertebra.  Seen by GI     Atypical chest pain likely secondary to  Odynophagia Concern for food impaction with spontaneous clearance. CT scan was without any acute findings.  Speech therapy has seen the patient and currently on dysphagia 3 diet.   Plan for modified barium swallow while in the hospital.  GI has seen the patient      Supraventricular tachycardia (HCC) Required metoprolol IV x 1.  On amiodarone.  Currently controlled.  Continue.     Chronic combined systolic and diastolic congestive heart failure (HCC)  Beta-blocker given parenterally today. Resume Entresto 24-26 mg p.o. twice daily once cleared for PO.     Hyponatremia Mild and chronic.  Asymptomatic.  Monitor BMP while in the hospital.     Normocytic anemia No evidence of bleeding.  Latest hemoglobin of 12.9.     Essential hypertension Continue metoprolol     Bilateral hearing loss History of cochlear implant.  Supportive care.     Diet-controlled diabetes mellitus (HCC) Diet controlled.     BPH (benign prostatic hyperplasia) On finasteride.  Debility, weakness.  Will get PT evaluation.      DVT prophylaxis: enoxaparin (LOVENOX) injection 40 mg Start: 05/13/23 2200   Code Status:     Code Status: Full Code  Disposition: Home likely on 05/14/2023 if tolerating p.o., await modified barium swallow PT evaluation.  Status is: Inpatient  Remains inpatient appropriate because:Odynophagia, pending modified barium swallow, PT evaluation,   Family Communication: Spoke with the patient's wife at bedside  Consultants:  GI  Procedures:  None  Antimicrobials:  Rocephin IV  Anti-infectives (From admission, onward)    Start     Dose/Rate Route Frequency Ordered Stop   05/12/23 2000  cefTRIAXone (ROCEPHIN) 1 g in sodium chloride 0.9 % 100 mL IVPB        1 g 200 mL/hr over 30 Minutes Intravenous Every  24 hours 05/12/23 0129     05/11/23 1745  cefTRIAXone (ROCEPHIN) 1 g in sodium chloride 0.9 % 100 mL IVPB        1 g 200 mL/hr over 30 Minutes Intravenous  Once 05/11/23 1740 05/11/23 1835   05/11/23 1745  azithromycin (ZITHROMAX) 500 mg in sodium chloride 0.9 % 250 mL IVPB        500 mg 250 mL/hr over 60 Minutes Intravenous  Once 05/11/23 1740  05/11/23 1942        Subjective: Today, patient was seen and examined at bedside.  Patient states that he feels a little better today.  Daughter at bedside.  Denies any nausea vomiting or fever.  Able to tolerate a dysphagia 3 diet.  No chest pain.  Has not had a bowel movement 2 days.  Objective: Vitals:   05/12/23 1447 05/12/23 1623 05/12/23 2143 05/13/23 0530  BP: 131/75 131/71 (!) 152/72 133/72  Pulse: 71 75 73 71  Resp:   18 18  Temp:  98.2 F (36.8 C) 99.9 F (37.7 C) 98.8 F (37.1 C)  TempSrc:  Oral Oral Oral  SpO2:  98% 96% 96%  Weight:      Height:        Intake/Output Summary (Last 24 hours) at 05/13/2023 1438 Last data filed at 05/13/2023 1100 Gross per 24 hour  Intake 100 ml  Output 900 ml  Net -800 ml   Filed Weights   05/11/23 1457  Weight: 49.9 kg    Physical Examination: Body mass index is 15.78 kg/m.   General: Cachectic built, not in obvious distress, alert awake and Communicative HENT:   No scleral pallor or icterus noted. Oral mucosa is moist.  Chest:   Diminished breath sounds bilaterally.  CVS: S1 &S2 heard. No murmur.  Regular rate and rhythm. Abdomen: Soft, nontender, nondistended.  Bowel sounds are heard.   Extremities: No cyanosis, clubbing or edema.  Peripheral pulses are palpable. Psych: Alert, awake and Communicative. CNS:  No cranial nerve deficits.  Moves all extremities. Skin: Warm and dry.  No rashes noted.  Data Reviewed:   CBC: Recent Labs  Lab 05/11/23 1504 05/12/23 0557 05/13/23 0747  WBC 17.7* 20.1* 11.3*  NEUTROABS  --  17.2*  --   HGB 13.8 12.9* 12.0*  HCT 40.6 38.5* 35.6*  MCV 90.4 91.7 91.8  PLT 265 220 206    Basic Metabolic Panel: Recent Labs  Lab 05/11/23 1504 05/12/23 0557 05/13/23 0747  NA 131* 133* 133*  K 4.5 3.8 4.1  CL 96* 100 103  CO2 25 24 24   GLUCOSE 217* 137* 133*  BUN 19 19 23   CREATININE 1.27* 0.90 1.07  CALCIUM 8.8* 8.3* 8.1*  MG  --  2.1  --   PHOS  --  2.9  --     Liver  Function Tests: Recent Labs  Lab 05/12/23 0557 05/13/23 0747  AST 19 17  ALT 15 15  ALKPHOS 37* 39  BILITOT 1.0 0.7  PROT 6.2* 5.9*  ALBUMIN 3.2* 2.9*     Radiology Studies: CT CHEST WO CONTRAST Result Date: 05/12/2023 CLINICAL DATA:  Chest wall pain EXAM: CT CHEST WITHOUT CONTRAST TECHNIQUE: Multidetector CT imaging of the chest was performed following the standard protocol without IV contrast. RADIATION DOSE REDUCTION: This exam was performed according to the departmental dose-optimization program which includes automated exposure control, adjustment of the mA and/or kV according to patient size and/or use of iterative reconstruction technique. COMPARISON:  CT chest  abdomen and pelvis 10/02/2022 and CT chest abdomen and pelvis 02/19/2021. FINDINGS: Cardiovascular: No significant vascular findings. Normal heart size. No pericardial effusion. There are atherosclerotic calcifications of the aorta and coronary arteries. Mediastinum/Nodes: There is a posterior hypodense left thyroid nodule measuring up to 2.1 cm which appears unchanged from prior examination. No enlarged mediastinal or hilar lymph nodes are seen. There are calcified right hilar lymph nodes. The esophagus is within normal limits. Lungs/Pleura: There is a trace left pleural effusion which is new from prior examination. There some dependent atelectasis in the bilateral lower lobes. There are biapical calcified pleural plaques. There are calcified granulomas in the right middle lobe and left lower lobe. There is no focal lung consolidation. There is no pneumothorax. Upper Abdomen: There some residual contrast seen within both kidneys. There is a 18 mm cyst in the right lobe of the liver. Musculoskeletal: There are multiple healed right lower rib fractures. No acute fractures are seen. There stable chronic compression deformities of T2, T4, T5, T6 and T7 as well as C7. The bones are diffusely osteopenic. IMPRESSION: 1. New trace left  pleural effusion. 2. Stable chronic compression deformities of C7, T2, T4, T5, T6 and T7. 3. Stable 2.1 cm left thyroid nodule. No follow-up necessary given stability. (Ref: J Am Coll Radiol. 2015 Feb;12(2): 143-50). 4. Aortic atherosclerosis. Aortic Atherosclerosis (ICD10-I70.0). Electronically Signed   By: Darliss Cheney M.D.   On: 05/12/2023 22:25   CT ABDOMEN PELVIS W CONTRAST Result Date: 05/12/2023 CLINICAL DATA:  Sepsis, 40 pound weight loss 1 year.  Dysphagia. EXAM: CT ABDOMEN AND PELVIS WITH CONTRAST TECHNIQUE: Multidetector CT imaging of the abdomen and pelvis was performed using the standard protocol following bolus administration of intravenous contrast. RADIATION DOSE REDUCTION: This exam was performed according to the departmental dose-optimization program which includes automated exposure control, adjustment of the mA and/or kV according to patient size and/or use of iterative reconstruction technique. CONTRAST:  OMNIPAQUE IOHEXOL 300 MG/ML  SOLN COMPARISON:  CT 03/16/2023 FINDINGS: Lower chest: Bibasilar atelectasis greater on the LEFT. Small bilateral pleural effusions. Hepatobiliary: Multiple benign hepatic cysts. Postcholecystectomy. No biliary duct dilatation. Pancreas: Pancreas is normal. No ductal dilatation. No pancreatic inflammation. Spleen: Normal spleen Adrenals/urinary tract: Adrenal glands and kidneys are normal. Ureters normal. Foley catheter in the bladder. Gas in the bladder likely related to catheterization. Dense material in the dependent surface of the bladder likely represents calcific sludge. Stomach/Bowel: The stomach, duodenum, and small bowel normal. Multiple diverticula of the descending colon and sigmoid colon without acute inflammation. Vascular/Lymphatic: Abdominal aorta is normal caliber with atherosclerotic calcification. There is no retroperitoneal or periportal lymphadenopathy. No pelvic lymphadenopathy. Reproductive: Prostate unremarkable Other: No free  fluid. Musculoskeletal: No aggressive osseous lesion. IMPRESSION: 1. No clear evidence of infection. Bibasilar atelectasis and small effusions greater on the LEFT. 2. Calcific sludge within the urinary bladder. Foley catheter within the bladder. No renal infection identified. Electronically Signed   By: Genevive Bi M.D.   On: 05/12/2023 16:41   DG Chest Portable 1 View Result Date: 05/11/2023 CLINICAL DATA:  Chest pain, possible food impaction EXAM: PORTABLE CHEST 1 VIEW COMPARISON:  10/02/2022 FINDINGS: Upright frontal view of the chest demonstrates a stable cardiac silhouette. Continued ectasia of the thoracic aorta. No airspace disease, effusion, or pneumothorax. Chronic scarring or atelectasis at the left lung base, with stable elevation of the left hemidiaphragm. No acute bony abnormalities. IMPRESSION: 1. No acute intrathoracic process. 2. If food impaction remains a clinical concern, esophagram or upright  chest x-ray after oral contrast ingestion could be performed. Electronically Signed   By: Sharlet Salina M.D.   On: 05/11/2023 16:17      LOS: 1 day    Joycelyn Das, MD [facility] Available via Epic secure chat 7am-7pm After these hours, please refer to coverage provider listed on [email or web address] 05/13/2023, 2:38 PM

## 2023-05-13 NOTE — Hospital Course (Signed)
Raymond Fowler is a 89 y.o. male with medical history significant of bilateral hearing loss,cochlear implant, BPH, chronic indwelling Foley catheter, chronic combined systolic and diastolic heart failure,  SVT, hypertension,PVD, protein calorie malnutrition, subclinical hypothyroidism, thrombocytopenia presented to the emergency department with chest pain after getting a piece of meat stuck in his esophagus.  He has had a 40 pound unintentional weight loss since last year.  In the ED, patient had slightly elevated blood pressure.  Lab showed mild leukocytosis at 17.7.  CMP showed mild hyponatremia at 131.  Creatinine slightly elevated at 1.2.  Urinalysis with some white cells in urine.  Chest x-ray without any acute findings.  In the ED patient received azithromycin 500 mg IVPB, ceftriaxone 1 g IVPB and Maalox plus viscous Xylocaine 2% and was admitted hospital for further evaluation.  Assessment and Plan:    SIRS (systemic inflammatory response syndrome) Patient had leukocytosis on presentation with some tachypnea..  Concern for UTI versus pneumonia.  Continue Rocephin IV.  CT of the chest showed trace left effusion with compression deformities of C7 and T2 T4-T5-T6 and T7 which were chronic.  CT scan of the abdomen and pelvis with bibasilar atelectasis but no other source of infection. Continue ceftriaxone 1 g every 24 hours. :   Unintentional weight loss   Mild protein malnutrition (HCC) Has history of difficulty swallowing.  CT scan of the chest abdomen pelvis without any obvious findings except for compression deformities of the vertebra.  Seen by GI     Atypical chest pain likely secondary to   Odynophagia Concern for food impaction with spontaneous clearance. CT scan was without any acute findings.  Speech therapy has seen the patient and currently on dysphagia 3 diet.  Plan for modified barium swallow while in the hospital.  GI to follow.     Supraventricular tachycardia (HCC) Required  metoprolol IV x 1.  On amiodarone.  Currently controlled     Chronic combined systolic and diastolic congestive heart failure (HCC)  Beta-blocker given parenterally today. Resume Entresto 24-26 mg p.o. twice daily once cleared for PO.     Hyponatremia Mild and chronic.  Asymptomatic.  Monitor BMP while in the hospital.     Normocytic anemia No evidence of bleeding.  Latest hemoglobin of 12.9.     Essential hypertension Continue metoprolol     Bilateral hearing loss History of cochlear implant.  Supportive care.     Diet-controlled diabetes mellitus (HCC) Diet controlled.     BPH (benign prostatic hyperplasia) On finasteride.

## 2023-05-13 NOTE — Progress Notes (Signed)
Subjective: Patient states he has been able to tolerate his diet(dysphagia level 3) without any difficulty.  He has not had pain or trouble on swallowing. He reports resolution of chest pain. He has not had bowel movement in 2 days.  Objective: Vital signs in last 24 hours: Temp:  [98 F (36.7 C)-99.9 F (37.7 C)] 98.8 F (37.1 C) (12/14 0530) Pulse Rate:  [71-75] 71 (12/14 0530) Resp:  [18] 18 (12/14 0530) BP: (131-154)/(71-83) 133/72 (12/14 0530) SpO2:  [96 %-98 %] 96 % (12/14 0530) Weight change:  Last BM Date : 05/10/23  PE: Frail, cachectic, elderly, hard of hearing GENERAL: Not in distress  ABDOMEN: Soft, nondistended, nontender EXTREMITIES: No deformity  Lab Results: Results for orders placed or performed during the hospital encounter of 05/11/23 (from the past 48 hours)  Basic metabolic panel     Status: Abnormal   Collection Time: 05/11/23  3:04 PM  Result Value Ref Range   Sodium 131 (L) 135 - 145 mmol/L   Potassium 4.5 3.5 - 5.1 mmol/L   Chloride 96 (L) 98 - 111 mmol/L   CO2 25 22 - 32 mmol/L   Glucose, Bld 217 (H) 70 - 99 mg/dL    Comment: Glucose reference range applies only to samples taken after fasting for at least 8 hours.   BUN 19 8 - 23 mg/dL   Creatinine, Ser 4.09 (H) 0.61 - 1.24 mg/dL   Calcium 8.8 (L) 8.9 - 10.3 mg/dL   GFR, Estimated 54 (L) >60 mL/min    Comment: (NOTE) Calculated using the CKD-EPI Creatinine Equation (2021)    Anion gap 10 5 - 15    Comment: Performed at [facility], [address] [place], [place] [address]  CBC     Status: Abnormal   Collection Time: 05/11/23  3:04 PM  Result Value Ref Range   WBC 17.7 (H) 4.0 - 10.5 K/uL   RBC 4.49 4.22 - 5.81 MIL/uL   Hemoglobin 13.8 13.0 - 17.0 g/dL   HCT 47.8 29.5 - 62.1 %   MCV 90.4 80.0 - 100.0 fL   MCH 30.7 26.0 - 34.0 pg   MCHC 34.0 30.0 - 36.0 g/dL   RDW 30.8 65.7 - 84.6 %   Platelets 265 150 - 400 K/uL   nRBC 0.0 0.0 - 0.2 %    Comment: Performed at [facility], [address] [place], [place] [address]  Troponin I (High Sensitivity)     Status: None   Collection Time: 05/11/23  3:04 PM  Result Value Ref Range   Troponin I (High Sensitivity) 14 <18 ng/L    Comment: (NOTE) Elevated high sensitivity troponin I (hsTnI) values and significant  changes across serial measurements may suggest ACS but many other  chronic and acute conditions are known to elevate hsTnI results.  Refer to the "Links" section for chest pain algorithms and additional  guidance. Performed at [facility], [address] [place], [place] [address]   Urinalysis, Routine w reflex microscopic -Urine, Clean Catch     Status: Abnormal   Collection Time: 05/11/23  6:14 PM  Result Value Ref Range   Color, Urine YELLOW YELLOW   APPearance CLOUDY (A) CLEAR   Specific Gravity, Urine 1.020 1.005 - 1.030   pH 7.0 5.0 - 8.0   Glucose, UA NEGATIVE NEGATIVE mg/dL   Hgb urine dipstick SMALL (A) NEGATIVE   Bilirubin Urine NEGATIVE NEGATIVE   Ketones, ur NEGATIVE  NEGATIVE mg/dL   Protein, ur 161 (A) NEGATIVE mg/dL   Nitrite NEGATIVE NEGATIVE   Leukocytes,Ua MODERATE (A) NEGATIVE    Comment: Performed at [facility], [address] [place], [place] [address]  Urinalysis, Microscopic (reflex)     Status: Abnormal   Collection Time: 05/11/23  6:14 PM  Result Value Ref Range   RBC / HPF 0-5 0 - 5 RBC/hpf   WBC, UA 11-20 0 - 5 WBC/hpf   Bacteria, UA MANY (A) NONE SEEN   Squamous Epithelial / HPF 0-5 0 - 5 /HPF    Comment: Performed at [facility], [address] [place], [place] [address]  Resp panel by RT-PCR (RSV, Flu A&B, Covid)     Status: None   Collection Time: 05/11/23  7:48 PM   Specimen: Nasal Swab  Result Value Ref Range   SARS Coronavirus 2 by RT PCR NEGATIVE NEGATIVE    Comment: (NOTE) SARS-CoV-2 target nucleic acids are NOT DETECTED.  The SARS-CoV-2 RNA is generally detectable in upper respiratory specimens during  the acute phase of infection. The lowest concentration of SARS-CoV-2 viral copies this assay can detect is 138 copies/mL. A negative result does not preclude SARS-Cov-2 infection and should not be used as the sole basis for treatment or other patient management decisions. A negative result may occur with  improper specimen collection/handling, submission of specimen other than nasopharyngeal swab, presence of viral mutation(s) within the areas targeted by this assay, and inadequate number of viral copies(<138 copies/mL). A negative result must be combined with clinical observations, patient history, and epidemiological information. The expected result is Negative.  Fact Sheet for Patients:  BloggerCourse.com  Fact Sheet for Healthcare Providers:  SeriousBroker.it  This test is no t yet approved or cleared by the [place] FDA and  has been authorized for detection and/or diagnosis of SARS-CoV-2 by FDA under an Emergency Use Authorization (EUA). This EUA will remain  in effect (meaning this test can be used) for the duration of the COVID-19 declaration under Section 564(b)(1) of the Act, 21 U.S.C.section 360bbb-3(b)(1), unless the authorization is terminated  or revoked sooner.       Influenza A by PCR NEGATIVE NEGATIVE   Influenza B by PCR NEGATIVE NEGATIVE    Comment: (NOTE) The Xpert Xpress SARS-CoV-2/FLU/RSV plus assay is intended as an aid in the diagnosis of influenza from Nasopharyngeal swab specimens and should not be used as a sole basis for treatment. Nasal washings and aspirates are unacceptable for Xpert Xpress SARS-CoV-2/FLU/RSV testing.  Fact Sheet for Patients: BloggerCourse.com  Fact Sheet for Healthcare Providers: SeriousBroker.it  This test is not yet approved or cleared by the [place] FDA and has been authorized for detection and/or diagnosis of  SARS-CoV-2 by FDA under an Emergency Use Authorization (EUA). This EUA will remain in effect (meaning this test can be used) for the duration of the COVID-19 declaration under Section 564(b)(1) of the Act, 21 U.S.C. section 360bbb-3(b)(1), unless the authorization is terminated or revoked.     Resp Syncytial Virus by PCR NEGATIVE NEGATIVE    Comment: (NOTE) Fact Sheet for Patients: BloggerCourse.com  Fact Sheet for Healthcare Providers: SeriousBroker.it  This test is not yet approved or cleared by the [place] FDA and has been authorized for detection and/or diagnosis of SARS-CoV-2 by FDA under an Emergency Use Authorization (EUA). This EUA will remain in effect (meaning this test can be used) for the duration of the COVID-19 declaration under Section  564(b)(1) of the Act, 21 U.S.C. section 360bbb-3(b)(1), unless the authorization is terminated or revoked.  Performed at [facility], [address] [place], [place] [address]   Blood culture (routine x 2)     Status: None (Preliminary result)   Collection Time: 05/11/23  7:48 PM   Specimen: BLOOD LEFT HAND  Result Value Ref Range   Specimen Description      BLOOD LEFT HAND Performed at [facility], [address] [place], [place] [address]    Special Requests      BOTTLES DRAWN AEROBIC AND ANAEROBIC Blood Culture adequate volume Performed at [facility], [address] [place], [place] [address]    Culture      NO GROWTH < 12 HOURS Performed at [facility], [address] [place], [place] [address]    Report Status PENDING   Blood culture (routine x 2)     Status: None (Preliminary result)   Collection Time: 05/11/23  7:48 PM   Specimen: BLOOD RIGHT FOREARM  Result Value Ref Range   Specimen Description      BLOOD RIGHT FOREARM Performed at [facility], [address] [place], [place] [address]    Special  Requests      BOTTLES DRAWN AEROBIC AND ANAEROBIC Blood Culture results may not be optimal due to an inadequate volume of blood received in culture bottles Performed at [facility], [address] [place], [place] [address]    Culture      NO GROWTH < 12 HOURS Performed at [facility], [address] [place], [place] [address]    Report Status PENDING   CBC with Differential/Platelet     Status: Abnormal   Collection Time: 05/12/23  5:57 AM  Result Value Ref Range   WBC 20.1 (H) 4.0 - 10.5 K/uL   RBC 4.20 (L) 4.22 - 5.81 MIL/uL   Hemoglobin 12.9 (L) 13.0 - 17.0 g/dL   HCT 96.2 (L) 95.2 - 84.1 %   MCV 91.7 80.0 - 100.0 fL   MCH 30.7 26.0 - 34.0 pg   MCHC 33.5 30.0 - 36.0 g/dL   RDW 32.4 40.1 - 02.7 %   Platelets 220 150 - 400 K/uL   nRBC 0.0 0.0 - 0.2 %   Neutrophils Relative % 85 %   Neutro Abs 17.2 (H) 1.7 - 7.7 K/uL   Lymphocytes Relative 5 %   Lymphs Abs 1.1 0.7 - 4.0 K/uL   Monocytes Relative 9 %   Monocytes Absolute 1.7 (H) 0.1 - 1.0 K/uL   Eosinophils Relative 0 %   Eosinophils Absolute 0.0 0.0 - 0.5 K/uL   Basophils Relative 0 %   Basophils Absolute 0.0 0.0 - 0.1 K/uL   Immature Granulocytes 1 %   Abs Immature Granulocytes 0.15 (H) 0.00 - 0.07 K/uL    Comment: Performed at [facility], [address] [place], [place] [address]  Basic metabolic panel     Status: Abnormal   Collection Time: 05/12/23  5:57 AM  Result Value Ref Range   Sodium 133 (L) 135 - 145 mmol/L   Potassium 3.8 3.5 - 5.1 mmol/L   Chloride 100 98 - 111 mmol/L   CO2 24 22 - 32 mmol/L   Glucose, Bld 137 (H) 70 - 99 mg/dL    Comment: Glucose reference range applies only to samples taken after fasting for at least  8 hours.   BUN 19 8 - 23 mg/dL   Creatinine, Ser 6.04 0.61 - 1.24 mg/dL   Calcium 8.3 (L) 8.9 - 10.3 mg/dL   GFR, Estimated >54 >09 mL/min    Comment: (NOTE) Calculated using the CKD-EPI Creatinine Equation (2021)    Anion gap 9 5 - 15     Comment: Performed at [facility], [address] [place], [place] [address]  Hepatic function panel     Status: Abnormal   Collection Time: 05/12/23  5:57 AM  Result Value Ref Range   Total Protein 6.2 (L) 6.5 - 8.1 g/dL   Albumin 3.2 (L) 3.5 - 5.0 g/dL   AST 19 15 - 41 U/L   ALT 15 0 - 44 U/L   Alkaline Phosphatase 37 (L) 38 - 126 U/L   Total Bilirubin 1.0 <1.2 mg/dL   Bilirubin, Direct 0.2 0.0 - 0.2 mg/dL   Indirect Bilirubin 0.8 0.3 - 0.9 mg/dL    Comment: Performed at [facility], [address] [place], [place] [address]  Magnesium     Status: None   Collection Time: 05/12/23  5:57 AM  Result Value Ref Range   Magnesium 2.1 1.7 - 2.4 mg/dL    Comment: Performed at [facility], [address], [place], [place] [address]  Phosphorus     Status: None   Collection Time: 05/12/23  5:57 AM  Result Value Ref Range   Phosphorus 2.9 2.5 - 4.6 mg/dL    Comment: Performed at [facility], [address], [place], [place] [address]  Glucose, capillary     Status: Abnormal   Collection Time: 05/12/23  4:25 PM  Result Value Ref Range   Glucose-Capillary 167 (H) 70 - 99 mg/dL    Comment: Glucose reference range applies only to samples taken after fasting for at least 8 hours.  Glucose, capillary     Status: Abnormal   Collection Time: 05/13/23 12:02 AM  Result Value Ref Range   Glucose-Capillary 131 (H) 70 - 99 mg/dL    Comment: Glucose reference range applies only to samples taken after fasting for at least 8 hours.  Glucose, capillary     Status: Abnormal   Collection Time: 05/13/23  6:19 AM  Result Value Ref Range   Glucose-Capillary 130 (H) 70 - 99 mg/dL    Comment: Glucose reference range applies only to samples taken after fasting for at least 8 hours.  Comprehensive metabolic panel     Status: Abnormal   Collection Time: 05/13/23  7:47 AM  Result Value Ref Range   Sodium 133 (L) 135 - 145 mmol/L    Potassium 4.1 3.5 - 5.1 mmol/L   Chloride 103 98 - 111 mmol/L   CO2 24 22 - 32 mmol/L   Glucose, Bld 133 (H) 70 - 99 mg/dL    Comment: Glucose reference range applies only to samples taken after fasting for at least 8 hours.   BUN 23 8 - 23 mg/dL   Creatinine, Ser 8.46 0.61 - 1.24 mg/dL   Calcium 8.1 (L) 8.9 - 10.3 mg/dL   Total Protein 5.9 (L) 6.5 - 8.1 g/dL   Albumin 2.9 (L) 3.5 - 5.0 g/dL   AST 17 15 - 41 U/L   ALT 15 0 - 44 U/L   Alkaline Phosphatase 39 38 - 126 U/L   Total Bilirubin 0.7 <1.2 mg/dL   GFR, Estimated >96 >29 mL/min    Comment: (NOTE)  Calculated using the CKD-EPI Creatinine Equation (2021)    Anion gap 6 5 - 15    Comment: Performed at [facility], [address] [place], [place] [address]  CBC     Status: Abnormal   Collection Time: 05/13/23  7:47 AM  Result Value Ref Range   WBC 11.3 (H) 4.0 - 10.5 K/uL   RBC 3.88 (L) 4.22 - 5.81 MIL/uL   Hemoglobin 12.0 (L) 13.0 - 17.0 g/dL   HCT 60.4 (L) 54.0 - 98.1 %   MCV 91.8 80.0 - 100.0 fL   MCH 30.9 26.0 - 34.0 pg   MCHC 33.7 30.0 - 36.0 g/dL   RDW 19.1 47.8 - 29.5 %   Platelets 206 150 - 400 K/uL   nRBC 0.0 0.0 - 0.2 %    Comment: Performed at [facility], [address] [place], [place] [address]    Studies/Results: CT CHEST WO CONTRAST Result Date: 05/12/2023 CLINICAL DATA:  Chest wall pain EXAM: CT CHEST WITHOUT CONTRAST TECHNIQUE: Multidetector CT imaging of the chest was performed following the standard protocol without IV contrast. RADIATION DOSE REDUCTION: This exam was performed according to the departmental dose-optimization program which includes automated exposure control, adjustment of the mA and/or kV according to patient size and/or use of iterative reconstruction technique. COMPARISON:  CT chest abdomen and pelvis 10/02/2022 and CT chest abdomen and pelvis 02/19/2021. FINDINGS: Cardiovascular: No significant vascular findings. Normal heart size. No pericardial  effusion. There are atherosclerotic calcifications of the aorta and coronary arteries. Mediastinum/Nodes: There is a posterior hypodense left thyroid nodule measuring up to 2.1 cm which appears unchanged from prior examination. No enlarged mediastinal or hilar lymph nodes are seen. There are calcified right hilar lymph nodes. The esophagus is within normal limits. Lungs/Pleura: There is a trace left pleural effusion which is new from prior examination. There some dependent atelectasis in the bilateral lower lobes. There are biapical calcified pleural plaques. There are calcified granulomas in the right middle lobe and left lower lobe. There is no focal lung consolidation. There is no pneumothorax. Upper Abdomen: There some residual contrast seen within both kidneys. There is a 18 mm cyst in the right lobe of the liver. Musculoskeletal: There are multiple healed right lower rib fractures. No acute fractures are seen. There stable chronic compression deformities of T2, T4, T5, T6 and T7 as well as C7. The bones are diffusely osteopenic. IMPRESSION: 1. New trace left pleural effusion. 2. Stable chronic compression deformities of C7, T2, T4, T5, T6 and T7. 3. Stable 2.1 cm left thyroid nodule. No follow-up necessary given stability. (Ref: J Am Coll Radiol. 2015 Feb;12(2): 143-50). 4. Aortic atherosclerosis. Aortic Atherosclerosis (ICD10-I70.0). Electronically Signed   By: Darliss Cheney M.D.   On: 05/12/2023 22:25   CT ABDOMEN PELVIS W CONTRAST Result Date: 05/12/2023 CLINICAL DATA:  Sepsis, 40 pound weight loss 1 year.  Dysphagia. EXAM: CT ABDOMEN AND PELVIS WITH CONTRAST TECHNIQUE: Multidetector CT imaging of the abdomen and pelvis was performed using the standard protocol following bolus administration of intravenous contrast. RADIATION DOSE REDUCTION: This exam was performed according to the departmental dose-optimization program which includes automated exposure control, adjustment of the mA and/or kV according  to patient size and/or use of iterative reconstruction technique. CONTRAST:  OMNIPAQUE IOHEXOL 300 MG/ML  SOLN COMPARISON:  CT 03/16/2023 FINDINGS: Lower chest: Bibasilar atelectasis greater on the LEFT. Small bilateral pleural effusions. Hepatobiliary: Multiple benign hepatic cysts. Postcholecystectomy. No biliary duct dilatation. Pancreas: Pancreas  is normal. No ductal dilatation. No pancreatic inflammation. Spleen: Normal spleen Adrenals/urinary tract: Adrenal glands and kidneys are normal. Ureters normal. Foley catheter in the bladder. Gas in the bladder likely related to catheterization. Dense material in the dependent surface of the bladder likely represents calcific sludge. Stomach/Bowel: The stomach, duodenum, and small bowel normal. Multiple diverticula of the descending colon and sigmoid colon without acute inflammation. Vascular/Lymphatic: Abdominal aorta is normal caliber with atherosclerotic calcification. There is no retroperitoneal or periportal lymphadenopathy. No pelvic lymphadenopathy. Reproductive: Prostate unremarkable Other: No free fluid. Musculoskeletal: No aggressive osseous lesion. IMPRESSION: 1. No clear evidence of infection. Bibasilar atelectasis and small effusions greater on the LEFT. 2. Calcific sludge within the urinary bladder. Foley catheter within the bladder. No renal infection identified. Electronically Signed   By: Genevive Bi M.D.   On: 05/12/2023 16:41   DG Chest Portable 1 View Result Date: 05/11/2023 CLINICAL DATA:  Chest pain, possible food impaction EXAM: PORTABLE CHEST 1 VIEW COMPARISON:  10/02/2022 FINDINGS: Upright frontal view of the chest demonstrates a stable cardiac silhouette. Continued ectasia of the thoracic aorta. No airspace disease, effusion, or pneumothorax. Chronic scarring or atelectasis at the left lung base, with stable elevation of the left hemidiaphragm. No acute bony abnormalities. IMPRESSION: 1. No acute intrathoracic process. 2. If  food impaction remains a clinical concern, esophagram or upright chest x-ray after oral contrast ingestion could be performed. Electronically Signed   By: Sharlet Salina M.D.   On: 05/11/2023 16:17    Medications: I have reviewed the patient's current medications.  Assessment: Dysphagia, weight loss, history of food bolus impaction-resolved on its own Malnutrition, albumin 2.9, total protein 5.9  CT chest: New trace left pleural effusion, stable chronic compression deformities of C7/T2/T4/T5/T6/T7, stable 2.1 cm left thyroid nodule CT abdomen and pelvis: Stomach, duodenum, small bowel normal, multiple diverticula of descending and sigmoid, normal pancreas, multiple benign hepatic cyst, no biliary dilatation  Multiple comorbidities-elderly, 89, significant bilateral hearing loss, history of cochlear implant, BPH, combined systolic and diastolic heart failure, history of CVA, history of COVID infection, type 2 diabetes, dilated cardiomyopathy, SVT, hypertension, A-fib, aortic atherosclerosis, osteoporosis, pathological fracture of vertebrae, subclinical hypothyroidism  Plan: No clear indication of malignancy noted on CT of chest, abdomen and pelvis. Patient is able to tolerate dysphagia level 3 diet. Dysphagia may be related to dysmotility versus esophageal pathology. Will plan to proceed with a barium swallow for further evaluation. Discussed same plan with patient's nurse.  Kerin Salen, MD 05/13/2023, 10:02 AM

## 2023-05-13 NOTE — Progress Notes (Signed)
Mobility Specialist - Progress Note   05/13/23 1216  Mobility  Activity Ambulated with assistance in hallway  Level of Assistance Standby assist, set-up cues, supervision of patient - no hands on  Assistive Device Front wheel walker  Distance Ambulated (ft) 250 ft  Activity Response Tolerated well  Mobility Referral Yes  Mobility visit 1 Mobility  Mobility Specialist Start Time (ACUTE ONLY) 1201  Mobility Specialist Stop Time (ACUTE ONLY) 1214  Mobility Specialist Time Calculation (min) (ACUTE ONLY) 13 min   Pt received in bed and agreeable to mobility. No complaints during session. Required verbal cues for safety.  Pt to recliner for lunch after session with all needs met.    [facility]

## 2023-05-13 NOTE — Progress Notes (Addendum)
Pt calm sitting in chair at bedside with wife,  pt has been calm and in no distress ate 100% of his dinner and has remained in the chair since return from swallow eval.  Md aware of barium test  Safety measures in place   call bell within reach  handoff report completed to include all updated poc at bedside with Driss MSN

## 2023-05-13 NOTE — Progress Notes (Addendum)
Modified Barium Swallow Study  Patient Details  Name: Raymond Fowler MRN: [id number] Date of Birth: 27-Jan-1933  Today's Date: 05/13/2023  Modified Barium Swallow completed.  Full report located under Chart Review in the Imaging Section.  History of Present Illness Raymond Fowler is an 89 y.o. male who presented to the emergency department with chest pain after getting a piece of meat stuck in his esophagus - per notes.  Radiologist advised barium swallow or CXR after swallowing contrast to determine if meat lodged as esophagus was normal on CT.  Dx SIRS, odynophagia.  PMHx bilateral hearing loss, bilateral cerumen impaction, cochlear implant, BPH, chronic indwelling Foley catheter, cardiac murmur, chronic combined systolic and diastolic heart failure, cerebrovascular disease, COVID-19 virus infection, type 2 diabetes, dilated cardiomyopathy, dizziness, palpitations, SVT, hypertension, aortic atherosclerosis, hypomagnesemia, hyponatremia hypophosphatemia, normocytic anemia, osteoporosis, pathological fracture of vertebra, PVD, protein calorie malnutrition.  He has had a 40 pound unintentional weight loss since last year.   Clinical Impression Patient presents with mild oropharyngeal dysphagia.  Dysphagia characterized by impaired pharyngeal motility, tongue base retraction, laryngeal closure resulting in pharyngeal retention and mild amount of penetration of thin and nectar thick liquids.   Pt's swallow trigger is delayed to pyriform sinus with liquid and his pyriforms are shallow which allows penetration of liquids.  Trace aspiration of thin, nectar mixed with secretions noted just below the vocal cord. Patient benefits from following solid with liquid to help transit vallecular retention into esophagus.  He does not sense retention in pharynx.  Barium tablet given with nectar thick liquid did not transit into esophagus and was noted on anterior posterior view to be in the right vallecular space.  Multiple  compensation strategies attempted including providing patient with pudding, nectar thick and thin barium as well as water and soda with various postures including chin tuck head turn right head turn left not being effective.  Again patient is not sensate to barium tablet halting thus recommend if not contraindicated patient be given medicine crushed if it is any larger than 13 mm in size.  Upon esophageal sweep patient appears with some liquid retention distally, MBS does not diagnose below UES.  Recommend patient continue dysphagia 3 diet with nectar thick liquids but allow thin water between meals freely and follow-up with speech pathologist at next venue of care to strengthen his swallow. Factors that may increase risk of adverse event in presence of aspiration Rubye Oaks & Clearance Coots 2021): Frail or deconditioned;Reduced saliva  Swallow Evaluation Recommendations Recommendations: PO diet PO Diet Recommendation: Dysphagia 3 (Mechanical soft);Mildly thick liquids (Level 2, nectar thick) (thin water between meals ok) Liquid Administration via: Cup;Straw Medication Administration: Crushed with puree Supervision: Patient able to self-feed Swallowing strategies  : Slow rate;Small bites/sips;effortful swallow;Multiple dry swallows after each bite/sip Postural changes: Position pt fully upright for meals;Stay upright 30-60 min after meals Oral care recommendations: Oral care BID (2x/day)   Raymond Infante, MS [facility] SLP Acute Rehab Services Office [phone]    Chales Abrahams 05/13/2023,6:04 PM

## 2023-05-14 DIAGNOSIS — R634 Abnormal weight loss: Secondary | ICD-10-CM | POA: Diagnosis not present

## 2023-05-14 DIAGNOSIS — N400 Enlarged prostate without lower urinary tract symptoms: Secondary | ICD-10-CM

## 2023-05-14 DIAGNOSIS — R6510 Systemic inflammatory response syndrome (SIRS) of non-infectious origin without acute organ dysfunction: Secondary | ICD-10-CM | POA: Diagnosis not present

## 2023-05-14 DIAGNOSIS — R1310 Dysphagia, unspecified: Secondary | ICD-10-CM | POA: Diagnosis not present

## 2023-05-14 DIAGNOSIS — H918X3 Other specified hearing loss, bilateral: Secondary | ICD-10-CM | POA: Diagnosis not present

## 2023-05-14 DIAGNOSIS — R0789 Other chest pain: Secondary | ICD-10-CM | POA: Diagnosis not present

## 2023-05-14 LAB — BASIC METABOLIC PANEL
Anion gap: 6 (ref 5–15)
BUN: 18 mg/dL (ref 8–23)
CO2: 25 mmol/L (ref 22–32)
Calcium: 8.1 mg/dL — ABNORMAL LOW (ref 8.9–10.3)
Chloride: 99 mmol/L (ref 98–111)
Creatinine, Ser: 0.59 mg/dL — ABNORMAL LOW (ref 0.61–1.24)
GFR, Estimated: >60 mL/min (ref >60)
Glucose, Bld: 114 mg/dL — ABNORMAL HIGH (ref 70–99)
Potassium: 3.6 mmol/L (ref 3.5–5.1)
Sodium: 130 mmol/L — ABNORMAL LOW (ref 135–145)

## 2023-05-14 LAB — GLUCOSE, CAPILLARY
Glucose-Capillary: 113 mg/dL — ABNORMAL HIGH (ref 70–99)
Glucose-Capillary: 143 mg/dL — ABNORMAL HIGH (ref 70–99)
Glucose-Capillary: 154 mg/dL — ABNORMAL HIGH (ref 70–99)

## 2023-05-14 LAB — CBC
HCT: 36.1 % — ABNORMAL LOW (ref 39.0–52.0)
Hemoglobin: 12 g/dL — ABNORMAL LOW (ref 13.0–17.0)
MCH: 30.5 pg (ref 26.0–34.0)
MCHC: 33.2 g/dL (ref 30.0–36.0)
MCV: 91.9 fL (ref 80.0–100.0)
Platelets: 221 10*3/uL (ref 150–400)
RBC: 3.93 MIL/uL — ABNORMAL LOW (ref 4.22–5.81)
RDW: 13.3 % (ref 11.5–15.5)
WBC: 7.8 10*3/uL (ref 4.0–10.5)
nRBC: 0 % (ref 0.0–0.2)

## 2023-05-14 LAB — MAGNESIUM: Magnesium: 1.9 mg/dL (ref 1.7–2.4)

## 2023-05-14 NOTE — Progress Notes (Signed)
Subjective: Patient states he was able to tolerate a soft diet for breakfast today without associated trouble or pain on swallowing.  Objective: Vital signs in last 24 hours: Temp:  [98.1 F (36.7 C)-98.2 F (36.8 C)] 98.2 F (36.8 C) (12/15 0511) Pulse Rate:  [58-72] 58 (12/15 0511) Resp:  [15-18] 18 (12/15 0511) BP: (139-167)/(59-86) 167/86 (12/15 0511) SpO2:  [98 %-99 %] 99 % (12/15 0511) Weight change:  Last BM Date : 05/10/23  PE: Cachectic, frail, hard of hearing GENERAL: Not in distress  ABDOMEN: Soft, nondistended, nontender EXTREMITIES: No deformity  Lab Results: Results for orders placed or performed during the hospital encounter of 05/11/23 (from the past 48 hours)  Glucose, capillary     Status: Abnormal   Collection Time: 05/12/23  4:25 PM  Result Value Ref Range   Glucose-Capillary 167 (H) 70 - 99 mg/dL    Comment: Glucose reference range applies only to samples taken after fasting for at least 8 hours.  Glucose, capillary     Status: Abnormal   Collection Time: 05/13/23 12:02 AM  Result Value Ref Range   Glucose-Capillary 131 (H) 70 - 99 mg/dL    Comment: Glucose reference range applies only to samples taken after fasting for at least 8 hours.  Glucose, capillary     Status: Abnormal   Collection Time: 05/13/23  6:19 AM  Result Value Ref Range   Glucose-Capillary 130 (H) 70 - 99 mg/dL    Comment: Glucose reference range applies only to samples taken after fasting for at least 8 hours.  Comprehensive metabolic panel     Status: Abnormal   Collection Time: 05/13/23  7:47 AM  Result Value Ref Range   Sodium 133 (L) 135 - 145 mmol/L   Potassium 4.1 3.5 - 5.1 mmol/L   Chloride 103 98 - 111 mmol/L   CO2 24 22 - 32 mmol/L   Glucose, Bld 133 (H) 70 - 99 mg/dL    Comment: Glucose reference range applies only to samples taken after fasting for at least 8 hours.   BUN 23 8 - 23 mg/dL   Creatinine, Ser 1.61 0.61 - 1.24 mg/dL   Calcium 8.1 (L) 8.9 - 10.3 mg/dL    Total Protein 5.9 (L) 6.5 - 8.1 g/dL   Albumin 2.9 (L) 3.5 - 5.0 g/dL   AST 17 15 - 41 U/L   ALT 15 0 - 44 U/L   Alkaline Phosphatase 39 38 - 126 U/L   Total Bilirubin 0.7 <1.2 mg/dL   GFR, Estimated >09 >60 mL/min    Comment: (NOTE) Calculated using the CKD-EPI Creatinine Equation (2021)    Anion gap 6 5 - 15    Comment: Performed at [facility], [address] [place], [place] [address]  CBC     Status: Abnormal   Collection Time: 05/13/23  7:47 AM  Result Value Ref Range   WBC 11.3 (H) 4.0 - 10.5 K/uL   RBC 3.88 (L) 4.22 - 5.81 MIL/uL   Hemoglobin 12.0 (L) 13.0 - 17.0 g/dL   HCT 81.1 (L) 91.4 - 78.2 %   MCV 91.8 80.0 - 100.0 fL   MCH 30.9 26.0 - 34.0 pg   MCHC 33.7 30.0 - 36.0 g/dL   RDW 95.6 21.3 - 08.6 %   Platelets 206 150 - 400 K/uL   nRBC 0.0 0.0 - 0.2 %    Comment: Performed at [facility], [address] [place], [place] [address]  Glucose, capillary  Status: Abnormal   Collection Time: 05/13/23 11:22 AM  Result Value Ref Range   Glucose-Capillary 157 (H) 70 - 99 mg/dL    Comment: Glucose reference range applies only to samples taken after fasting for at least 8 hours.  Glucose, capillary     Status: Abnormal   Collection Time: 05/13/23  5:22 PM  Result Value Ref Range   Glucose-Capillary 176 (H) 70 - 99 mg/dL    Comment: Glucose reference range applies only to samples taken after fasting for at least 8 hours.  Glucose, capillary     Status: Abnormal   Collection Time: 05/14/23 12:02 AM  Result Value Ref Range   Glucose-Capillary 113 (H) 70 - 99 mg/dL    Comment: Glucose reference range applies only to samples taken after fasting for at least 8 hours.  Basic metabolic panel     Status: Abnormal   Collection Time: 05/14/23  6:45 AM  Result Value Ref Range   Sodium 130 (L) 135 - 145 mmol/L   Potassium 3.6 3.5 - 5.1 mmol/L   Chloride 99 98 - 111 mmol/L   CO2 25 22 - 32 mmol/L   Glucose, Bld 114 (H) 70 - 99 mg/dL     Comment: Glucose reference range applies only to samples taken after fasting for at least 8 hours.   BUN 18 8 - 23 mg/dL   Creatinine, Ser 6.94 (L) 0.61 - 1.24 mg/dL   Calcium 8.1 (L) 8.9 - 10.3 mg/dL   GFR, Estimated >85 >46 mL/min    Comment: (NOTE) Calculated using the CKD-EPI Creatinine Equation (2021)    Anion gap 6 5 - 15    Comment: Performed at [facility], [address], [place], [place] [address]  CBC     Status: Abnormal   Collection Time: 05/14/23  6:45 AM  Result Value Ref Range   WBC 7.8 4.0 - 10.5 K/uL   RBC 3.93 (L) 4.22 - 5.81 MIL/uL   Hemoglobin 12.0 (L) 13.0 - 17.0 g/dL   HCT 00.9 (L) 38.1 - 82.9 %   MCV 91.9 80.0 - 100.0 fL   MCH 30.5 26.0 - 34.0 pg   MCHC 33.2 30.0 - 36.0 g/dL   RDW 93.7 16.9 - 67.8 %   Platelets 221 150 - 400 K/uL   nRBC 0.0 0.0 - 0.2 %    Comment: Performed at [facility], [address], [place], [place] [address]  Magnesium     Status: None   Collection Time: 05/14/23  6:45 AM  Result Value Ref Range   Magnesium 1.9 1.7 - 2.4 mg/dL    Comment: Performed at [facility], [address] [place], [place] [address]  Glucose, capillary     Status: Abnormal   Collection Time: 05/14/23 11:21 AM  Result Value Ref Range   Glucose-Capillary 154 (H) 70 - 99 mg/dL    Comment: Glucose reference range applies only to samples taken after fasting for at least 8 hours.    Studies/Results: DG Swallowing Func-Speech Pathology Result Date: 05/13/2023 Table formatting from the original result was not included. Modified Barium Swallow Study Patient Details Name: Printis Eckenrod MRN: [id number] Date of Birth: 18-May-1933 Today's Date: 05/13/2023 HPI/PMH: HPI: Jishnu Lankford is an 89 y.o. male who presented to the emergency department with chest pain after getting a piece of meat stuck in his esophagus - per notes.  Radiologist advised barium swallow or CXR after swallowing contrast to determine if meat lodged  as esophagus was normal on CT.  Dx SIRS, odynophagia.  PMHx bilateral hearing loss, bilateral cerumen impaction, cochlear implant, BPH, chronic indwelling Foley catheter, cardiac murmur, chronic combined systolic and diastolic heart failure, cerebrovascular disease, COVID-19 virus infection, type 2 diabetes, dilated cardiomyopathy, dizziness, palpitations, SVT, hypertension, aortic atherosclerosis, hypomagnesemia, hyponatremia hypophosphatemia, normocytic anemia, osteoporosis, pathological fracture of vertebra, PVD, protein calorie malnutrition.  He has had a 40 pound unintentional weight loss since last year. Clinical Impression: Clinical Impression: Patient presents with mild oropharyngeal dysphagia.  Dysphagia characterized by impaired pharyngeal motility, tongue base retraction, laryngeal closure resulting in pharyngeal retention and mild amount of penetration of thin and nectar thick liquids.  Trace aspiration of thin, nectar mixed with secretions noted just below the vocal cord. Patient benefits from following solid with liquid to help transit vallecular retention into esophagus.  He does not sense retention in pharynx.  Barium tablet given with nectar thick liquid did not transit into esophagus and was noted on anterior posterior view to be in the right vallecular space.  Multiple compensation strategies attempted including providing patient with pudding, nectar thick and thin barium as well as water and soda with various postures including chin tuck head turn right head turn left not being effective.  Again patient is not sensate to barium tablet halting thus recommend if not contraindicated patient be given medicine crushed if it is any larger than 13 mm in size.  Upon esophageal sweep patient appears with some liquid retention distally, MBS does not diagnose below UES.  Recommend patient continue dysphagia 3 diet with nectar thick liquids but allow thin water between meals freely and follow-up with speech  pathologist at next venue of care to strengthen his swallow. Factors that may increase risk of adverse event in presence of aspiration Rubye Oaks & Clearance Coots 2021): Factors that may increase risk of adverse event in presence of aspiration Rubye Oaks & Clearance Coots 2021): Frail or deconditioned; Reduced saliva Recommendations/Plan: Swallowing Evaluation Recommendations Swallowing Evaluation Recommendations Recommendations: PO diet PO Diet Recommendation: Dysphagia 3 (Mechanical soft); Mildly thick liquids (Level 2, nectar thick) (thin water between meals ok) Liquid Administration via: Cup; Straw Medication Administration: Crushed with puree Supervision: Patient able to self-feed Swallowing strategies  : Slow rate; Small bites/sips; effortful swallow; Multiple dry swallows after each bite/sip Postural changes: Position pt fully upright for meals; Stay upright 30-60 min after meals Oral care recommendations: Oral care BID (2x/day) Treatment Plan Treatment Plan Treatment recommendations: Therapy as outlined in treatment plan below Follow-up recommendations: No SLP follow up Functional status assessment: Patient has had a recent decline in their functional status and demonstrates the ability to make significant improvements in function in a reasonable and predictable amount of time. Treatment frequency: Min 1x/week Treatment duration: 1 week Interventions: Aspiration precaution training; Patient/family education; Compensatory techniques Recommendations Recommendations for follow up therapy are one component of a multi-disciplinary discharge planning process, led by the attending physician.  Recommendations may be updated based on patient status, additional functional criteria and insurance authorization. Assessment: Orofacial Exam: Orofacial Exam Oral Cavity: Oral Hygiene: WFL Oral Cavity - Dentition: Adequate natural dentition (some missing) Orofacial Anatomy: WFL Oral Motor/Sensory Function: Suspected cranial nerve impairment (left  facial asymmetry) CN V - Trigeminal: WFL CN VII - Facial: -- (left facial asymmetry) CN XII - Hypoglossal: WFL Anatomy: Anatomy: WFL Boluses Administered: Boluses Administered Boluses Administered: Thin liquids (Level 0); Mildly thick liquids (Level 2, nectar thick); Moderately thick liquids (Level 3, honey thick); Puree; Solid  Oral Impairment Domain: Oral Impairment Domain  Lip Closure: No labial escape Tongue control during bolus hold: Posterior escape of less than half of bolus Bolus preparation/mastication: Slow prolonged chewing/mashing with complete recollection Bolus transport/lingual motion: Delayed initiation of tongue motion (oral holding) Oral residue: Residue collection on oral structures; Trace residue lining oral structures Location of oral residue : Tongue Initiation of pharyngeal swallow : Pyriform sinuses  Pharyngeal Impairment Domain: Pharyngeal Impairment Domain Soft palate elevation: No bolus between soft palate (SP)/pharyngeal wall (PW) Laryngeal elevation: Partial superior movement of thyroid cartilage/partial approximation of arytenoids to epiglottic petiole Anterior hyoid excursion: Partial anterior movement Epiglottic movement: Partial inversion Laryngeal vestibule closure: Incomplete, narrow column air/contrast in laryngeal vestibule Pharyngeal stripping wave : Present - diminished Pharyngeal contraction (A/P view only): -- (incomplete) Pharyngoesophageal segment opening: Partial distention/partial duration, partial obstruction of flow Tongue base retraction: Trace column of contrast or air between tongue base and PPW Pharyngeal residue: Trace residue within or on pharyngeal structures Location of pharyngeal residue: Valleculae; Pyriform sinuses  Esophageal Impairment Domain: Esophageal Impairment Domain Esophageal clearance upright position: Complete clearance, esophageal coating Pill: Pill Consistency administered: Mildly thick liquids (Level 2, nectar thick) Mildly thick liquids (Level  2, nectar thick): WFL Penetration/Aspiration Scale Score: Penetration/Aspiration Scale Score 1.  Material does not enter airway: Puree; Solid 2.  Material enters airway, remains ABOVE vocal cords then ejected out: Mildly thick liquids (Level 2, nectar thick) 8.  Material enters airway, passes BELOW cords without attempt by patient to eject out (silent aspiration) : Thin liquids (Level 0) Compensatory Strategies: Compensatory Strategies Compensatory strategies: Yes Chin tuck: Ineffective Ineffective Chin Tuck: Pill Right head turn: Ineffective Ineffective Right Head Turn: Pill   General Information: Caregiver present: Yes  Diet Prior to this Study: Dysphagia 3 (mechanical soft); Mildly thick liquids (Level 2, nectar thick)   Temperature : Normal   Respiratory Status: WFL   Supplemental O2: Nasal cannula   History of Recent Intubation: No  Behavior/Cognition: Alert; Cooperative; Pleasant mood Self-Feeding Abilities: Able to self-feed Baseline vocal quality/speech: Normal Volitional Cough: Able to elicit Volitional Swallow: Able to elicit No data recorded Goal Planning: Prognosis for improved oropharyngeal function: Good Barriers to Reach Goals: Time post onset; Severity of deficits; Motivation No data recorded Patient/Family Stated Goal: none stated No data recorded Pain: Pain Assessment Pain Assessment: No/denies pain End of Session: Start Time:SLP Start Time (ACUTE ONLY): 1449 Stop Time: SLP Stop Time (ACUTE ONLY): 1520 Time Calculation:SLP Time Calculation (min) (ACUTE ONLY): 31 min Charges: SLP Evaluations $ SLP Speech Visit: 1 Visit SLP Evaluations $BSS Swallow: 1 Procedure $MBS Swallow: 1 Procedure $Swallowing Treatment: 1 Procedure SLP visit diagnosis: SLP Visit Diagnosis: Dysphagia, oropharyngeal phase (R13.12) Past Medical History: Past Medical History: Diagnosis Date  Bilateral hearing loss 07/29/2015  Bilateral impacted cerumen 07/29/2015  BPH (benign prostatic hyperplasia) 07/12/2021  Cardiac murmur 07/09/2021   Cerebrovascular disease 11/04/2016  Cochlear implant in place 02/24/2020  COVID-19 virus infection 07/12/2021  Diabetes mellitus without complication (HCC)   Diet-controlled diabetes mellitus (HCC) 07/09/2021  Dilated cardiomyopathy (HCC) 07/14/2021  Dizziness and giddiness 11/04/2016  Essential hypertension 07/06/2021  Frailty 07/06/2021  Hardening of the aorta (main artery of the heart) (HCC) 07/06/2021  Hypertension   Hypomagnesemia 07/12/2021  Hyponatremia 07/12/2021  Hypophosphatemia 07/13/2021  Normocytic anemia 07/12/2021  Osteoporosis 07/06/2021  Palpitations 07/09/2021  Pathological fracture of vertebra 07/06/2021  Peripheral vascular disorder due to diabetes mellitus (HCC) 08/23/2021  Protein calorie malnutrition (HCC) 07/06/2021  Protein-calorie malnutrition, severe 07/12/2021  Pure hypercholesterolemia 07/06/2021  Sensorineural hearing loss (SNHL) of both ears  02/27/2018  Subclinical hypothyroidism 07/13/2021  Supraventricular tachycardia (HCC) 07/09/2021  SVT (supraventricular tachycardia) (HCC) 07/11/2021  Thrombocytopenia (HCC) 07/12/2021  Type 2 diabetes mellitus with other circulatory complications (HCC) 07/06/2021  Wears hearing aid in right ear 03/10/2020 Past Surgical History: Past Surgical History: Procedure Laterality Date  APPENDECTOMY    CHOLECYSTECTOMY    COCHLEAR IMPLANT    EYE SURGERY    FRACTURE SURGERY    HERNIA REPAIR    TONSILLECTOMY   Rolena Infante, MS [facility] SLP Acute Rehab Services Office [phone] Chales Abrahams 05/13/2023, 6:05 PM  CT CHEST WO CONTRAST Result Date: 05/12/2023 CLINICAL DATA:  Chest wall pain EXAM: CT CHEST WITHOUT CONTRAST TECHNIQUE: Multidetector CT imaging of the chest was performed following the standard protocol without IV contrast. RADIATION DOSE REDUCTION: This exam was performed according to the departmental dose-optimization program which includes automated exposure control, adjustment of the mA and/or kV according to patient size and/or use of iterative reconstruction technique.  COMPARISON:  CT chest abdomen and pelvis 10/02/2022 and CT chest abdomen and pelvis 02/19/2021. FINDINGS: Cardiovascular: No significant vascular findings. Normal heart size. No pericardial effusion. There are atherosclerotic calcifications of the aorta and coronary arteries. Mediastinum/Nodes: There is a posterior hypodense left thyroid nodule measuring up to 2.1 cm which appears unchanged from prior examination. No enlarged mediastinal or hilar lymph nodes are seen. There are calcified right hilar lymph nodes. The esophagus is within normal limits. Lungs/Pleura: There is a trace left pleural effusion which is new from prior examination. There some dependent atelectasis in the bilateral lower lobes. There are biapical calcified pleural plaques. There are calcified granulomas in the right middle lobe and left lower lobe. There is no focal lung consolidation. There is no pneumothorax. Upper Abdomen: There some residual contrast seen within both kidneys. There is a 18 mm cyst in the right lobe of the liver. Musculoskeletal: There are multiple healed right lower rib fractures. No acute fractures are seen. There stable chronic compression deformities of T2, T4, T5, T6 and T7 as well as C7. The bones are diffusely osteopenic. IMPRESSION: 1. New trace left pleural effusion. 2. Stable chronic compression deformities of C7, T2, T4, T5, T6 and T7. 3. Stable 2.1 cm left thyroid nodule. No follow-up necessary given stability. (Ref: J Am Coll Radiol. 2015 Feb;12(2): 143-50). 4. Aortic atherosclerosis. Aortic Atherosclerosis (ICD10-I70.0). Electronically Signed   By: Darliss Cheney M.D.   On: 05/12/2023 22:25   CT ABDOMEN PELVIS W CONTRAST Result Date: 05/12/2023 CLINICAL DATA:  Sepsis, 40 pound weight loss 1 year.  Dysphagia. EXAM: CT ABDOMEN AND PELVIS WITH CONTRAST TECHNIQUE: Multidetector CT imaging of the abdomen and pelvis was performed using the standard protocol following bolus administration of intravenous contrast.  RADIATION DOSE REDUCTION: This exam was performed according to the departmental dose-optimization program which includes automated exposure control, adjustment of the mA and/or kV according to patient size and/or use of iterative reconstruction technique. CONTRAST:  OMNIPAQUE IOHEXOL 300 MG/ML  SOLN COMPARISON:  CT 03/16/2023 FINDINGS: Lower chest: Bibasilar atelectasis greater on the LEFT. Small bilateral pleural effusions. Hepatobiliary: Multiple benign hepatic cysts. Postcholecystectomy. No biliary duct dilatation. Pancreas: Pancreas is normal. No ductal dilatation. No pancreatic inflammation. Spleen: Normal spleen Adrenals/urinary tract: Adrenal glands and kidneys are normal. Ureters normal. Foley catheter in the bladder. Gas in the bladder likely related to catheterization. Dense material in the dependent surface of the bladder likely represents calcific sludge. Stomach/Bowel: The stomach, duodenum, and small bowel normal. Multiple diverticula of the descending colon and  sigmoid colon without acute inflammation. Vascular/Lymphatic: Abdominal aorta is normal caliber with atherosclerotic calcification. There is no retroperitoneal or periportal lymphadenopathy. No pelvic lymphadenopathy. Reproductive: Prostate unremarkable Other: No free fluid. Musculoskeletal: No aggressive osseous lesion. IMPRESSION: 1. No clear evidence of infection. Bibasilar atelectasis and small effusions greater on the LEFT. 2. Calcific sludge within the urinary bladder. Foley catheter within the bladder. No renal infection identified. Electronically Signed   By: Genevive Bi M.D.   On: 05/12/2023 16:41    Medications: I have reviewed the patient's current medications.  Assessment: Dysphagia Food bolus impaction resolved on its own Speech pathology evaluation-dysphagia level 3, pured meats, nectar with meals and thin water between meals recommended  Multiple comorbidities: Bilateral hearing loss, cochlear implant, BPH,  chronic indwelling Foley catheter, chronic combined systolic and diastolic heart failure, SVT, hypertension, protein calorie malnutrition, hypothyroidism  Plan: Barium swallow with single contrast/thin barium ordered. If patient's symptoms are related to dysmotility, recommendation would be to follow speech therapy guidelines for oral intake. If an obvious stricture or narrowing is noted, he may benefit from EGD with dilation. Discussed the same with patient's wife present at bedside. Dr. Ewing Schlein to follow in a.m.Marland Kitchen  Kerin Salen, MD 05/14/2023, 11:47 AM

## 2023-05-14 NOTE — Progress Notes (Addendum)
PROGRESS NOTE    Raymond Fowler  VWU:[id number] DOB: 01-30-1933 DOA: 05/11/2023 PCP: Joycelyn Rua, MD    Brief Narrative:   Raymond Fowler is a 89 y.o. male with medical history significant of bilateral hearing loss,cochlear implant, BPH, chronic indwelling Foley catheter, chronic combined systolic and diastolic heart failure,  SVT, hypertension,PVD, protein calorie malnutrition, subclinical hypothyroidism, thrombocytopenia presented to the emergency department with chest pain after getting a piece of meat stuck in his esophagus.  He has had a 40 pound unintentional weight loss since last year.  In the ED, patient had slightly elevated blood pressure.  Lab showed mild leukocytosis at 17.7.  CMP showed mild hyponatremia at 131.  Creatinine slightly elevated at 1.2.  Urinalysis with some white cells in urine.  Chest x-ray without any acute findings. Patient received azithromycin 500 mg IVPB, ceftriaxone 1 g IVPB and Maalox plus viscous Xylocaine 2%, GI was consulted and was admitted hospital for further evaluation.  Assessment and Plan:    SIRS (systemic inflammatory response syndrome) Patient had leukocytosis on presentation with some tachypnea..  Concern for UTI.  CT of the chest showed trace left effusion with compression deformities of C7 and T2 T4-T5-T6 and T7 which were chronic.  CT scan of the abdomen and pelvis with bibasilar atelectasis but no other source of infection.  Continue Rocephin IV.  No culture report available. :   Unintentional weight loss   Mild protein malnutrition    Atypical chest pain likely secondary to  Odynophagia Denies further chest pain.  CT scan of the chest abdomen pelvis without any obvious findings except for compression deformities of the vertebra.  Seen by speech therapy.  GI on board and plan for barium swallow.  If there is distal narrowing might need EGD. CT scan was without any acute findings.  Speech therapy has recommended  dysphagia 3 diet.       Supraventricular tachycardia  Required metoprolol IV x 1.  On amiodarone and metoprolol.  Currently controlled.       Chronic combined systolic and diastolic congestive heart failure (HCC)  On Entresto 24-26 mg p.o. twice daily at home.  Currently on hold.  Restart when p.o. cleared.  Continue metoprolol.     Hyponatremia Mild and chronic.  Asymptomatic.  Latest sodium of 130.     Normocytic anemia No evidence of bleeding.  Latest hemoglobin of 12.0.     Essential hypertension Continue metoprolol, blood pressure seems to be stable at this time.    Bilateral hearing loss History of cochlear implant.  Supportive care.     Diet-controlled diabetes mellitus (HCC) Diet controlled.     BPH (benign prostatic hyperplasia) On finasteride.  Debility, weakness.  Physical therapy recommends home health PT on discharge.      DVT prophylaxis: enoxaparin (LOVENOX) injection 40 mg Start: 05/13/23 2200   Code Status:     Code Status: Full Code  Disposition: Home with home health likely in 1 to 2 days, plan for barium swallow evaluation  Status is: Inpatient  Remains inpatient appropriate because:Odynophagia, barium swallow and possible endoscopic intervention depending upon findings.     Family Communication: Spoke with the patient's wife at bedside on 05/13/2023  Consultants:  GI  Procedures:  Modified barium swallow  Antimicrobials:  Rocephin IV  Anti-infectives (From admission, onward)    Start     Dose/Rate Route Frequency Ordered Stop   05/12/23 2000  cefTRIAXone (ROCEPHIN) 1 g in sodium chloride 0.9 % 100 mL IVPB  1 g 200 mL/hr over 30 Minutes Intravenous Every 24 hours 05/12/23 0129     05/11/23 1745  cefTRIAXone (ROCEPHIN) 1 g in sodium chloride 0.9 % 100 mL IVPB        1 g 200 mL/hr over 30 Minutes Intravenous  Once 05/11/23 1740 05/11/23 1835   05/11/23 1745  azithromycin (ZITHROMAX) 500 mg in sodium chloride 0.9 % 250 mL IVPB        500 mg 250 mL/hr over  60 Minutes Intravenous  Once 05/11/23 1740 05/11/23 1942        Subjective: Today, patient was seen and examined at bedside.  Patient denies shortness of breath, fever, chills or rigor.  Hard of hearing.  Tolerating dysphagia 3 diet.  Denies any chest pain.    Objective: Vitals:   05/13/23 0530 05/13/23 2100 05/14/23 0511 05/14/23 1421  BP: 133/72 (!) 139/59 (!) 167/86 (!) 148/70  Pulse: 71 72 (!) 58 73  Resp: 18 15 18 16   Temp: 98.8 F (37.1 C) 98.1 F (36.7 C) 98.2 F (36.8 C) 97.6 F (36.4 C)  TempSrc: Oral Oral Oral Oral  SpO2: 96% 98% 99% 97%  Weight:      Height:        Intake/Output Summary (Last 24 hours) at 05/14/2023 1432 Last data filed at 05/14/2023 1200 Gross per 24 hour  Intake 610 ml  Output 2675 ml  Net -2065 ml   Filed Weights   05/11/23 1457  Weight: 49.9 kg    Physical Examination: Body mass index is 15.78 kg/m.   General: Cachectic, not in obvious distress, alert awake and Communicative, elderly male, appears chronically ill, hard of hearing. HENT:   No scleral pallor or icterus noted. Oral mucosa is moist.  Chest:   Diminished breath sounds bilaterally, no overt wheezes or crackles. CVS: S1 &S2 heard. No murmur.  Regular rate and rhythm. Abdomen: Soft, nontender, nondistended.  Bowel sounds are heard.   Extremities: No cyanosis, clubbing or edema.  Peripheral pulses are palpable. Psych: Alert, awake Communicative, hard of hearing. CNS:  No cranial nerve deficits.  Moves all extremities. Skin: Warm and dry.  No rashes noted.  Data Reviewed:   CBC: Recent Labs  Lab 05/11/23 1504 05/12/23 0557 05/13/23 0747 05/14/23 0645  WBC 17.7* 20.1* 11.3* 7.8  NEUTROABS  --  17.2*  --   --   HGB 13.8 12.9* 12.0* 12.0*  HCT 40.6 38.5* 35.6* 36.1*  MCV 90.4 91.7 91.8 91.9  PLT 265 220 206 221    Basic Metabolic Panel: Recent Labs  Lab 05/11/23 1504 05/12/23 0557 05/13/23 0747 05/14/23 0645  NA 131* 133* 133* 130*  K 4.5 3.8 4.1 3.6  CL  96* 100 103 99  CO2 25 24 24 25   GLUCOSE 217* 137* 133* 114*  BUN 19 19 23 18   CREATININE 1.27* 0.90 1.07 0.59*  CALCIUM 8.8* 8.3* 8.1* 8.1*  MG  --  2.1  --  1.9  PHOS  --  2.9  --   --     Liver Function Tests: Recent Labs  Lab 05/12/23 0557 05/13/23 0747  AST 19 17  ALT 15 15  ALKPHOS 37* 39  BILITOT 1.0 0.7  PROT 6.2* 5.9*  ALBUMIN 3.2* 2.9*     Radiology Studies: DG Swallowing Func-Speech Pathology Result Date: 05/13/2023 Table formatting from the original result was not included. Modified Barium Swallow Study Patient Details Name: Elree Boening MRN: [id number] Date of Birth: February 24, 1933 Today's Date: 05/13/2023 HPI/PMH: HPI:  Ruth Engelhard is an 89 y.o. male who presented to the emergency department with chest pain after getting a piece of meat stuck in his esophagus - per notes.  Radiologist advised barium swallow or CXR after swallowing contrast to determine if meat lodged as esophagus was normal on CT.  Dx SIRS, odynophagia.  PMHx bilateral hearing loss, bilateral cerumen impaction, cochlear implant, BPH, chronic indwelling Foley catheter, cardiac murmur, chronic combined systolic and diastolic heart failure, cerebrovascular disease, COVID-19 virus infection, type 2 diabetes, dilated cardiomyopathy, dizziness, palpitations, SVT, hypertension, aortic atherosclerosis, hypomagnesemia, hyponatremia hypophosphatemia, normocytic anemia, osteoporosis, pathological fracture of vertebra, PVD, protein calorie malnutrition.  He has had a 40 pound unintentional weight loss since last year. Clinical Impression: Clinical Impression: Patient presents with mild oropharyngeal dysphagia.  Dysphagia characterized by impaired pharyngeal motility, tongue base retraction, laryngeal closure resulting in pharyngeal retention and mild amount of penetration of thin and nectar thick liquids.  Trace aspiration of thin, nectar mixed with secretions noted just below the vocal cord. Patient benefits from following  solid with liquid to help transit vallecular retention into esophagus.  He does not sense retention in pharynx.  Barium tablet given with nectar thick liquid did not transit into esophagus and was noted on anterior posterior view to be in the right vallecular space.  Multiple compensation strategies attempted including providing patient with pudding, nectar thick and thin barium as well as water and soda with various postures including chin tuck head turn right head turn left not being effective.  Again patient is not sensate to barium tablet halting thus recommend if not contraindicated patient be given medicine crushed if it is any larger than 13 mm in size.  Upon esophageal sweep patient appears with some liquid retention distally, MBS does not diagnose below UES.  Recommend patient continue dysphagia 3 diet with nectar thick liquids but allow thin water between meals freely and follow-up with speech pathologist at next venue of care to strengthen his swallow. Factors that may increase risk of adverse event in presence of aspiration Rubye Oaks & Clearance Coots 2021): Factors that may increase risk of adverse event in presence of aspiration Rubye Oaks & Clearance Coots 2021): Frail or deconditioned; Reduced saliva Recommendations/Plan: Swallowing Evaluation Recommendations Swallowing Evaluation Recommendations Recommendations: PO diet PO Diet Recommendation: Dysphagia 3 (Mechanical soft); Mildly thick liquids (Level 2, nectar thick) (thin water between meals ok) Liquid Administration via: Cup; Straw Medication Administration: Crushed with puree Supervision: Patient able to self-feed Swallowing strategies  : Slow rate; Small bites/sips; effortful swallow; Multiple dry swallows after each bite/sip Postural changes: Position pt fully upright for meals; Stay upright 30-60 min after meals Oral care recommendations: Oral care BID (2x/day) Treatment Plan Treatment Plan Treatment recommendations: Therapy as outlined in treatment plan below  Follow-up recommendations: No SLP follow up Functional status assessment: Patient has had a recent decline in their functional status and demonstrates the ability to make significant improvements in function in a reasonable and predictable amount of time. Treatment frequency: Min 1x/week Treatment duration: 1 week Interventions: Aspiration precaution training; Patient/family education; Compensatory techniques Recommendations Recommendations for follow up therapy are one component of a multi-disciplinary discharge planning process, led by the attending physician.  Recommendations may be updated based on patient status, additional functional criteria and insurance authorization. Assessment: Orofacial Exam: Orofacial Exam Oral Cavity: Oral Hygiene: WFL Oral Cavity - Dentition: Adequate natural dentition (some missing) Orofacial Anatomy: WFL Oral Motor/Sensory Function: Suspected cranial nerve impairment (left facial asymmetry) CN V - Trigeminal: WFL CN VII - Facial: -- (  left facial asymmetry) CN XII - Hypoglossal: WFL Anatomy: Anatomy: WFL Boluses Administered: Boluses Administered Boluses Administered: Thin liquids (Level 0); Mildly thick liquids (Level 2, nectar thick); Moderately thick liquids (Level 3, honey thick); Puree; Solid  Oral Impairment Domain: Oral Impairment Domain Lip Closure: No labial escape Tongue control during bolus hold: Posterior escape of less than half of bolus Bolus preparation/mastication: Slow prolonged chewing/mashing with complete recollection Bolus transport/lingual motion: Delayed initiation of tongue motion (oral holding) Oral residue: Residue collection on oral structures; Trace residue lining oral structures Location of oral residue : Tongue Initiation of pharyngeal swallow : Pyriform sinuses  Pharyngeal Impairment Domain: Pharyngeal Impairment Domain Soft palate elevation: No bolus between soft palate (SP)/pharyngeal wall (PW) Laryngeal elevation: Partial superior movement of thyroid  cartilage/partial approximation of arytenoids to epiglottic petiole Anterior hyoid excursion: Partial anterior movement Epiglottic movement: Partial inversion Laryngeal vestibule closure: Incomplete, narrow column air/contrast in laryngeal vestibule Pharyngeal stripping wave : Present - diminished Pharyngeal contraction (A/P view only): -- (incomplete) Pharyngoesophageal segment opening: Partial distention/partial duration, partial obstruction of flow Tongue base retraction: Trace column of contrast or air between tongue base and PPW Pharyngeal residue: Trace residue within or on pharyngeal structures Location of pharyngeal residue: Valleculae; Pyriform sinuses  Esophageal Impairment Domain: Esophageal Impairment Domain Esophageal clearance upright position: Complete clearance, esophageal coating Pill: Pill Consistency administered: Mildly thick liquids (Level 2, nectar thick) Mildly thick liquids (Level 2, nectar thick): WFL Penetration/Aspiration Scale Score: Penetration/Aspiration Scale Score 1.  Material does not enter airway: Puree; Solid 2.  Material enters airway, remains ABOVE vocal cords then ejected out: Mildly thick liquids (Level 2, nectar thick) 8.  Material enters airway, passes BELOW cords without attempt by patient to eject out (silent aspiration) : Thin liquids (Level 0) Compensatory Strategies: Compensatory Strategies Compensatory strategies: Yes Chin tuck: Ineffective Ineffective Chin Tuck: Pill Right head turn: Ineffective Ineffective Right Head Turn: Pill   General Information: Caregiver present: Yes  Diet Prior to this Study: Dysphagia 3 (mechanical soft); Mildly thick liquids (Level 2, nectar thick)   Temperature : Normal   Respiratory Status: WFL   Supplemental O2: Nasal cannula   History of Recent Intubation: No  Behavior/Cognition: Alert; Cooperative; Pleasant mood Self-Feeding Abilities: Able to self-feed Baseline vocal quality/speech: Normal Volitional Cough: Able to elicit Volitional  Swallow: Able to elicit No data recorded Goal Planning: Prognosis for improved oropharyngeal function: Good Barriers to Reach Goals: Time post onset; Severity of deficits; Motivation No data recorded Patient/Family Stated Goal: none stated No data recorded Pain: Pain Assessment Pain Assessment: No/denies pain End of Session: Start Time:SLP Start Time (ACUTE ONLY): 1449 Stop Time: SLP Stop Time (ACUTE ONLY): 1520 Time Calculation:SLP Time Calculation (min) (ACUTE ONLY): 31 min Charges: SLP Evaluations $ SLP Speech Visit: 1 Visit SLP Evaluations $BSS Swallow: 1 Procedure $MBS Swallow: 1 Procedure $Swallowing Treatment: 1 Procedure SLP visit diagnosis: SLP Visit Diagnosis: Dysphagia, oropharyngeal phase (R13.12) Past Medical History: Past Medical History: Diagnosis Date  Bilateral hearing loss 07/29/2015  Bilateral impacted cerumen 07/29/2015  BPH (benign prostatic hyperplasia) 07/12/2021  Cardiac murmur 07/09/2021  Cerebrovascular disease 11/04/2016  Cochlear implant in place 02/24/2020  COVID-19 virus infection 07/12/2021  Diabetes mellitus without complication (HCC)   Diet-controlled diabetes mellitus (HCC) 07/09/2021  Dilated cardiomyopathy (HCC) 07/14/2021  Dizziness and giddiness 11/04/2016  Essential hypertension 07/06/2021  Frailty 07/06/2021  Hardening of the aorta (main artery of the heart) (HCC) 07/06/2021  Hypertension   Hypomagnesemia 07/12/2021  Hyponatremia 07/12/2021  Hypophosphatemia 07/13/2021  Normocytic anemia 07/12/2021  Osteoporosis 07/06/2021  Palpitations 07/09/2021  Pathological fracture of vertebra 07/06/2021  Peripheral vascular disorder due to diabetes mellitus (HCC) 08/23/2021  Protein calorie malnutrition (HCC) 07/06/2021  Protein-calorie malnutrition, severe 07/12/2021  Pure hypercholesterolemia 07/06/2021  Sensorineural hearing loss (SNHL) of both ears 02/27/2018  Subclinical hypothyroidism 07/13/2021  Supraventricular tachycardia (HCC) 07/09/2021  SVT (supraventricular tachycardia) (HCC) 07/11/2021  Thrombocytopenia (HCC)  07/12/2021  Type 2 diabetes mellitus with other circulatory complications (HCC) 07/06/2021  Wears hearing aid in right ear 03/10/2020 Past Surgical History: Past Surgical History: Procedure Laterality Date  APPENDECTOMY    CHOLECYSTECTOMY    COCHLEAR IMPLANT    EYE SURGERY    FRACTURE SURGERY    HERNIA REPAIR    TONSILLECTOMY   Rolena Infante, MS [facility] SLP Acute Rehab Services Office [phone] [address]rahams 05/13/2023, 6:05 PM  CT CHEST WO CONTRAST Result Date: 05/12/2023 CLINICAL DATA:  Chest wall pain EXAM: CT CHEST WITHOUT CONTRAST TECHNIQUE: Multidetector CT imaging of the chest was performed following the standard protocol without IV contrast. RADIATION DOSE REDUCTION: This exam was performed according to the departmental dose-optimization program which includes automated exposure control, adjustment of the mA and/or kV according to patient size and/or use of iterative reconstruction technique. COMPARISON:  CT chest abdomen and pelvis 10/02/2022 and CT chest abdomen and pelvis 02/19/2021. FINDINGS: Cardiovascular: No significant vascular findings. Normal heart size. No pericardial effusion. There are atherosclerotic calcifications of the aorta and coronary arteries. Mediastinum/Nodes: There is a posterior hypodense left thyroid nodule measuring up to 2.1 cm which appears unchanged from prior examination. No enlarged mediastinal or hilar lymph nodes are seen. There are calcified right hilar lymph nodes. The esophagus is within normal limits. Lungs/Pleura: There is a trace left pleural effusion which is new from prior examination. There some dependent atelectasis in the bilateral lower lobes. There are biapical calcified pleural plaques. There are calcified granulomas in the right middle lobe and left lower lobe. There is no focal lung consolidation. There is no pneumothorax. Upper Abdomen: There some residual contrast seen within both kidneys. There is a 18 mm cyst in the right lobe of the liver.  Musculoskeletal: There are multiple healed right lower rib fractures. No acute fractures are seen. There stable chronic compression deformities of T2, T4, T5, T6 and T7 as well as C7. The bones are diffusely osteopenic. IMPRESSION: 1. New trace left pleural effusion. 2. Stable chronic compression deformities of C7, T2, T4, T5, T6 and T7. 3. Stable 2.1 cm left thyroid nodule. No follow-up necessary given stability. (Ref: J Am Coll Radiol. 2015 Feb;12(2): 143-50). 4. Aortic atherosclerosis. Aortic Atherosclerosis (ICD10-I70.0). Electronically Signed   By: Darliss Cheney M.D.   On: 05/12/2023 22:25      LOS: 2 days    Joycelyn Das, MD [facility] Available via Epic secure chat 7am-7pm After these hours, please refer to coverage provider listed on [email or web address] 05/14/2023, 2:32 PM

## 2023-05-14 NOTE — Progress Notes (Signed)
CBG: 101 at 630 am. The data did not transfer from the glucometer to [facility]

## 2023-05-14 NOTE — Plan of Care (Signed)
  Problem: Education: Goal: Knowledge of General Education information will improve Description: Including pain rating scale, medication(s)/side effects and non-pharmacologic comfort measures Outcome: Progressing   Problem: Skin Integrity: Goal: Risk for impaired skin integrity will decrease Outcome: Progressing   

## 2023-05-15 ENCOUNTER — Inpatient Hospital Stay (HOSPITAL_COMMUNITY): Payer: Medicare Other

## 2023-05-15 DIAGNOSIS — R079 Chest pain, unspecified: Secondary | ICD-10-CM

## 2023-05-15 DIAGNOSIS — R6510 Systemic inflammatory response syndrome (SIRS) of non-infectious origin without acute organ dysfunction: Secondary | ICD-10-CM | POA: Diagnosis not present

## 2023-05-15 DIAGNOSIS — I5042 Chronic combined systolic (congestive) and diastolic (congestive) heart failure: Secondary | ICD-10-CM | POA: Diagnosis not present

## 2023-05-15 DIAGNOSIS — N400 Enlarged prostate without lower urinary tract symptoms: Secondary | ICD-10-CM | POA: Diagnosis not present

## 2023-05-15 DIAGNOSIS — H918X3 Other specified hearing loss, bilateral: Secondary | ICD-10-CM | POA: Diagnosis not present

## 2023-05-15 LAB — GLUCOSE, CAPILLARY
Glucose-Capillary: 106 mg/dL — ABNORMAL HIGH (ref 70–99)
Glucose-Capillary: 127 mg/dL — ABNORMAL HIGH (ref 70–99)
Glucose-Capillary: 136 mg/dL — ABNORMAL HIGH (ref 70–99)
Glucose-Capillary: 95 mg/dL (ref 70–99)

## 2023-05-15 MED ORDER — SACUBITRIL-VALSARTAN 24-26 MG PO TABS
1.0000 | ORAL_TABLET | Freq: Two times a day (BID) | ORAL | Status: DC
  Administered 2023-05-15 – 2023-05-16 (×2): 1 via ORAL
  Filled 2023-05-15 (×2): qty 1

## 2023-05-15 NOTE — Plan of Care (Signed)
  Problem: Education: Goal: Knowledge of General Education information will improve Description: Including pain rating scale, medication(s)/side effects and non-pharmacologic comfort measures Outcome: Progressing   Problem: Clinical Measurements: Goal: Ability to maintain clinical measurements within normal limits will improve Outcome: Progressing Goal: Will remain free from infection Outcome: Progressing Goal: Diagnostic test results will improve Outcome: Progressing Goal: Respiratory complications will improve Outcome: Progressing Goal: Cardiovascular complication will be avoided Outcome: Progressing   Problem: Activity: Goal: Risk for activity intolerance will decrease Outcome: Progressing   Problem: Nutrition: Goal: Adequate nutrition will be maintained Outcome: Progressing   Problem: Coping: Goal: Level of anxiety will decrease Outcome: Progressing   Problem: Elimination: Goal: Will not experience complications related to bowel motility Outcome: Progressing Goal: Will not experience complications related to urinary retention Outcome: Progressing   Problem: Pain Management: Goal: General experience of comfort will improve Outcome: Progressing   Problem: Safety: Goal: Ability to remain free from injury will improve Outcome: Progressing   Problem: Skin Integrity: Goal: Risk for impaired skin integrity will decrease Outcome: Progressing

## 2023-05-15 NOTE — Progress Notes (Signed)
Physical Therapy Treatment Patient Details Name: Raymond Fowler MRN: [id number] DOB: Jul 07, 1932 Today's Date: 05/15/2023   History of Present Illness 89 yo male presents to therapy following hospital admission 05/11/2023 due to R side chest pain following meal on 12/11 in which pt reports feeling as if food was stuck in his esophagus. Pt had episode of recurrent R side chest pain following lunch 12/12 and presented to ED. Pt PMH includes but is not limited to: CVA, DM II, HTN, B HOH with cochlear implant, OP, malnutrition, SVT, HTN, and heart failure.    PT Comments  General Comments: AxO x 3 pleasant and cooperative.  Spouse present.  Assisted OOB to amb in hallway went well.  Pt self able to transfer to EOB.  Pt tolerated amb a functional distance with walker for safety.  Believe pt will not need to use within home.  Pt plans to return home with Spouse. LPT has rec HH PT.     If plan is discharge home, recommend the following:     Can travel by private vehicle        Equipment Recommendations  None recommended by PT    Recommendations for Other Services       Precautions / Restrictions Precautions Precautions: Fall Restrictions Weight Bearing Restrictions Per Provider Order: No     Mobility  Bed Mobility Overal bed mobility: Needs Assistance Bed Mobility: Supine to Sit     Supine to sit: Supervision     General bed mobility comments: pt self able to rise and sit EOB.    Transfers Overall transfer level: Needs assistance Equipment used: 1 person hand held assist Transfers: Sit to/from Stand Sit to Stand: Contact guard assist, Supervision           General transfer comment: pt self able to rise and lower with good use of hands to steady self.    Ambulation/Gait Ambulation/Gait assistance: Contact guard assist, Supervision Gait Distance (Feet): 115 Feet Assistive device: 1 person hand held assist Gait Pattern/deviations: Step-to pattern, Decreased step length -  right, Decreased step length - left, Narrow base of support Gait velocity: decreased     General Gait Details: pt tolerated a functional distance amb in hallway HHA with no overt LOB.  Good alternating gait.  No c/o.   Stairs             Wheelchair Mobility     Tilt Bed    Modified Rankin (Stroke Patients Only)       Balance                                            Cognition   Behavior During Therapy: WFL for tasks assessed/performed Overall Cognitive Status: Within Functional Limits for tasks assessed                                 General Comments: AxO x 3 pleasant and cooperative.  Spouse present.        Exercises      General Comments        Pertinent Vitals/Pain      Home Living                          Prior Function  PT Goals (current goals can now be found in the care plan section) Progress towards PT goals: Progressing toward goals    Frequency    Min 1X/week      PT Plan      Co-evaluation              AM-PAC PT "6 Clicks" Mobility   Outcome Measure  Help needed turning from your back to your side while in a flat bed without using bedrails?: None Help needed moving from lying on your back to sitting on the side of a flat bed without using bedrails?: None Help needed moving to and from a bed to a chair (including a wheelchair)?: None Help needed standing up from a chair using your arms (e.g., wheelchair or bedside chair)?: None Help needed to walk in hospital room?: A Little Help needed climbing 3-5 steps with a railing? : A Little 6 Click Score: 22    End of Session Equipment Utilized During Treatment: Gait belt Activity Tolerance: Patient tolerated treatment well Patient left: in bed;with call bell/phone within reach;with family/visitor present Nurse Communication: Mobility status PT Visit Diagnosis: Unsteadiness on feet (R26.81);Other abnormalities of gait  and mobility (R26.89);Muscle weakness (generalized) (M62.81);Difficulty in walking, not elsewhere classified (R26.2)     Time: 7062-3762 PT Time Calculation (min) (ACUTE ONLY): 18 min  Charges:    $Gait Training: 8-22 mins PT General Charges $$ ACUTE PT VISIT: 1 Visit                     Felecia Shelling  PTA Acute  Rehabilitation Services Office M-F          [phone]

## 2023-05-15 NOTE — Progress Notes (Signed)
[facility]  PROGRESS NOTE  Raymond Fowler ZOX:[id number] DOB: 1932-11-15 DOA: 05/11/2023 PCP: Joycelyn Rua, MD   Brief HPI:    89 y.o. male with medical history significant of bilateral hearing loss,cochlear implant, BPH, chronic indwelling Foley catheter, chronic combined systolic and diastolic heart failure,  SVT, hypertension,PVD, protein calorie malnutrition, subclinical hypothyroidism, thrombocytopenia presented to the emergency department with chest pain after getting a piece of meat stuck in his esophagus.  He has had a 40 pound unintentional weight loss since last year.  In the ED, patient had slightly elevated blood pressure.  Lab showed mild leukocytosis at 17.7.  CMP showed mild hyponatremia at 131.  Creatinine slightly elevated at 1.2.  Urinalysis with some white cells in urine.  Chest x-ray without any acute findings. Patient received azithromycin 500 mg IVPB, ceftriaxone 1 g IVPB and Maalox plus viscous Xylocaine 2%, GI was consulted and was admitted hospital for further evaluation.     Assessment/Plan:    SIRS (systemic inflammatory response syndrome) Patient had leukocytosis on presentation with some tachypnea..   Concern for UTI.  CT of the chest showed trace left effusion with compression deformities of C7 and T2 T4-T5-T6 and T7 which were chronic.  CT scan of the abdomen and pelvis with bibasilar atelectasis but no other source of infection.  Started on IV Rocephin, blood cultures negative to date.  No urine cultures obtained.   -Discontinue IV Rocephin  : Unintentional weight loss   Mild protein malnutrition    Atypical chest pain likely secondary to  Odynophagia -GI was consulted, barium swallow ordered -Barium swallow was normal, GI signed off.  Speech therapy has recommended  dysphagia 3 diet.      Supraventricular tachycardia  Required metoprolol IV x 1.  On amiodarone and metoprolol.  Currently controlled.       Chronic combined systolic and diastolic  congestive heart failure (HCC)  On Entresto 24-26 mg p.o. twice daily at home.  Will restart Entresto Continue metoprolol.     Hyponatremia Mild and chronic.  Asymptomatic.  Latest sodium of 130. -Check sodium level in a.m.     Normocytic anemia No evidence of bleeding.  Latest hemoglobin of 12.0.     Essential hypertension Continue metoprolol, blood pressure seems to be stable at this time. Started on Entresto today     Bilateral hearing loss History of cochlear implant.  Supportive care.     Diet-controlled diabetes mellitus (HCC) Diet controlled.     BPH (benign prostatic hyperplasia) On finasteride.   Debility, weakness.   Physical therapy recommends home health PT on discharge.    Medications     amiodarone  100 mg Oral Daily   Chlorhexidine Gluconate Cloth  6 each Topical Daily   enoxaparin (LOVENOX) injection  40 mg Subcutaneous Q24H   finasteride  5 mg Oral Daily     Data Reviewed:   CBG:  Recent Labs  Lab 05/14/23 1121 05/14/23 1811 05/15/23 0106 05/15/23 0636 05/15/23 1121  GLUCAP 154* 143* 95 127* 136*    SpO2: 99 %    Vitals:   05/14/23 1421 05/14/23 2010 05/15/23 0553 05/15/23 1117  BP: (!) 148/70 (!) 149/87 (!) 166/90 (!) 172/77  Pulse: 73 61 64 70  Resp: 16 18 18    Temp: 97.6 F (36.4 C) 98.2 F (36.8 C) 97.8 F (36.6 C) 97.8 F (36.6 C)  TempSrc: Oral Oral Oral Oral  SpO2: 97% 99% 98% 99%  Weight:      Height:  Data Reviewed:  Basic Metabolic Panel: Recent Labs  Lab 05/11/23 1504 05/12/23 0557 05/13/23 0747 05/14/23 0645  NA 131* 133* 133* 130*  K 4.5 3.8 4.1 3.6  CL 96* 100 103 99  CO2 25 24 24 25   GLUCOSE 217* 137* 133* 114*  BUN 19 19 23 18   CREATININE 1.27* 0.90 1.07 0.59*  CALCIUM 8.8* 8.3* 8.1* 8.1*  MG  --  2.1  --  1.9  PHOS  --  2.9  --   --     CBC: Recent Labs  Lab 05/11/23 1504 05/12/23 0557 05/13/23 0747 05/14/23 0645  WBC 17.7* 20.1* 11.3* 7.8  NEUTROABS  --  17.2*  --   --    HGB 13.8 12.9* 12.0* 12.0*  HCT 40.6 38.5* 35.6* 36.1*  MCV 90.4 91.7 91.8 91.9  PLT 265 220 206 221    LFT Recent Labs  Lab 05/12/23 0557 05/13/23 0747  AST 19 17  ALT 15 15  ALKPHOS 37* 39  BILITOT 1.0 0.7  PROT 6.2* 5.9*  ALBUMIN 3.2* 2.9*     Antibiotics: Anti-infectives (From admission, onward)    Start     Dose/Rate Route Frequency Ordered Stop   05/12/23 2000  cefTRIAXone (ROCEPHIN) 1 g in sodium chloride 0.9 % 100 mL IVPB        1 g 200 mL/hr over 30 Minutes Intravenous Every 24 hours 05/12/23 0129     05/11/23 1745  cefTRIAXone (ROCEPHIN) 1 g in sodium chloride 0.9 % 100 mL IVPB        1 g 200 mL/hr over 30 Minutes Intravenous  Once 05/11/23 1740 05/11/23 1835   05/11/23 1745  azithromycin (ZITHROMAX) 500 mg in sodium chloride 0.9 % 250 mL IVPB        500 mg 250 mL/hr over 60 Minutes Intravenous  Once 05/11/23 1740 05/11/23 1942        DVT prophylaxis: Lovenox  Code Status: Full code  Family Communication: No family at bedside   CONSULTS    Subjective   Patient seen and examined, denies any pain.   Objective    Physical Examination:   General-appears in no acute distress Heart-S1-S2, regular, no murmur auscultated Lungs-clear to auscultation bilaterally, no wheezing or crackles auscultated Abdomen-soft, nontender, no organomegaly Extremities-no edema in the lower extremities Neuro-alert, oriented x3, no focal deficit noted  Status is: Inpatient:             Meredeth Ide   [facility] If 7PM-7AM, please contact night-coverage at [email or web address], Office  [phone]   05/15/2023, 5:21 PM  LOS: 3 days

## 2023-05-15 NOTE — Progress Notes (Signed)
Raymond Fowler 12:08 PM  Subjective: Patient seen and examined and case discussed with his wife as well as my partner Dr. Marca Ancona and his barium swallow was reviewed and they have no other complaints and both his abdominal pain and chest pain has resolved  Objective: Vital signs stable afebrile no acute distress abdomen is soft nontender barium swallow without significant obvious findings  Assessment: Feeding problem with normal barium swallow  Plan: Please let me know if I can be of any further assistance with this hospital stay otherwise our team is happy to see back as an outpatient if GI issues arise  [facility]  office [phone] After 5PM or if no answer call [phone]

## 2023-05-15 NOTE — Plan of Care (Signed)
  Problem: Education: Goal: Knowledge of General Education information will improve Description: Including pain rating scale, medication(s)/side effects and non-pharmacologic comfort measures Outcome: Progressing   Problem: Clinical Measurements: Goal: Ability to maintain clinical measurements within normal limits will improve Outcome: Progressing Goal: Will remain free from infection Outcome: Progressing   Problem: Activity: Goal: Risk for activity intolerance will decrease Outcome: Progressing   Problem: Coping: Goal: Level of anxiety will decrease Outcome: Progressing   Problem: Elimination: Goal: Will not experience complications related to bowel motility Outcome: Progressing   Problem: Safety: Goal: Ability to remain free from injury will improve Outcome: Progressing

## 2023-05-16 DIAGNOSIS — R6510 Systemic inflammatory response syndrome (SIRS) of non-infectious origin without acute organ dysfunction: Secondary | ICD-10-CM | POA: Diagnosis not present

## 2023-05-16 DIAGNOSIS — E119 Type 2 diabetes mellitus without complications: Secondary | ICD-10-CM | POA: Diagnosis not present

## 2023-05-16 DIAGNOSIS — R634 Abnormal weight loss: Secondary | ICD-10-CM | POA: Diagnosis not present

## 2023-05-16 DIAGNOSIS — H918X3 Other specified hearing loss, bilateral: Secondary | ICD-10-CM | POA: Diagnosis not present

## 2023-05-16 LAB — CULTURE, BLOOD (ROUTINE X 2)
Culture: NO GROWTH
Culture: NO GROWTH
Special Requests: ADEQUATE

## 2023-05-16 LAB — GLUCOSE, CAPILLARY
Glucose-Capillary: 108 mg/dL — ABNORMAL HIGH (ref 70–99)
Glucose-Capillary: 170 mg/dL — ABNORMAL HIGH (ref 70–99)

## 2023-05-16 MED ORDER — PANTOPRAZOLE SODIUM 40 MG PO TBEC: 40.0000 mg | DELAYED_RELEASE_TABLET | Freq: Every day | ORAL | 1 refills | Status: DC

## 2023-05-16 MED ORDER — METOPROLOL SUCCINATE ER 25 MG PO TB24: 12.5000 mg | ORAL_TABLET | Freq: Every day | ORAL | Status: DC

## 2023-05-16 MED ORDER — METOPROLOL SUCCINATE ER 25 MG PO TB24: 12.5000 mg | ORAL_TABLET | Freq: Every day | ORAL | 3 refills | Status: AC

## 2023-05-16 NOTE — TOC Initial Note (Signed)
Transition of Care [facility]) - Initial/Assessment Note    Patient Details  Name: Raymond Fowler MRN: [id number] Date of Birth: Jul 26, 1932  [facility]) CM/SW Contact:    Beckie Busing, RN Phone Number:[phone]  05/16/2023, 11:27 AM  Clinical Narrative:                 TOC acknowledges high risk for readmission. Patient is from home with wife where he functions with some assist. Patient does have PCP that he follows on regular basis. Patient does have affordable access to medications. Patient has transportation to appointments. TOC will follow for disposition needs.    Expected Discharge Plan: Home w Home Health Services Barriers to Discharge: No Barriers Identified   Patient Goals and CMS Choice Patient states their goals for this hospitalization and ongoing recovery are:: Wants to get better and go home. CMS Medicare.gov Compare Post Acute Care list provided to:: Patient Choice offered to / list presented to : Spouse Raymond Fowler ownership interest in [facility].provided to:: Spouse    Expected Discharge Plan and Services In-house Referral: NA Discharge Planning Services: CM Consult Post Acute Care Choice: Home Health Living arrangements for the past 2 months: Single Family Home                 DME Arranged: N/A DME Agency: NA       HH Arranged: PT, Speech Therapy HH Agency: Other - See comment Producer, television/film/video) Date HH Agency Contacted: 05/17/23 Time HH Agency Contacted: 1124 Representative spoke with at [facility] Agency: Angie  Prior Living Arrangements/Services Living arrangements for the past 2 months: Single Family Home Lives with:: Spouse Patient language and need for interpreter reviewed:: Yes Do you feel safe going back to the place where you live?: Yes      Need for Family Participation in Patient Care: Yes (Comment) Care giver support system in place?: Yes (comment) Current home services:  (n/a) Criminal Activity/Legal Involvement Pertinent to Current  Situation/Hospitalization: No - Comment as needed  Activities of Daily Living   ADL Screening (condition at time of admission) Independently performs ADLs?: No Does the patient have a NEW difficulty with bathing/dressing/toileting/self-feeding that is expected to last >3 days?: Yes (Initiates electronic notice to provider for possible OT consult) Does the patient have a NEW difficulty with getting in/out of bed, walking, or climbing stairs that is expected to last >3 days?: Yes (Initiates electronic notice to provider for possible PT consult) Does the patient have a NEW difficulty with communication that is expected to last >3 days?: No Is the patient deaf or have difficulty hearing?: Yes Does the patient have difficulty seeing, even when wearing glasses/contacts?: No Does the patient have difficulty concentrating, remembering, or making decisions?: Yes  Permission Sought/Granted Permission sought to share information with : Family Supports Permission granted to share information with : Yes, Verbal Permission Granted  Share Information with NAME: Raymond Fowler     Permission granted to share info w Relationship: spouse  Permission granted to share info w Contact Information: [phone]  Emotional Assessment Appearance:: Appears stated age Attitude/Demeanor/Rapport: Gracious Affect (typically observed): Accepting Orientation: : Oriented to Situation, Oriented to Place, Oriented to Self Alcohol / Substance Use: Not Applicable Psych Involvement: No (comment)  Admission diagnosis:  SIRS (systemic inflammatory response syndrome) (HCC) [R65.10] Chest pain, unspecified type [R07.9] Aspiration pneumonia, unspecified aspiration pneumonia type, unspecified laterality, unspecified part of lung (HCC) [J69.0] Patient Active Problem List   Diagnosis Date Noted   Unintentional weight loss  05/12/2023   Mild protein malnutrition (HCC) 05/12/2023   History of fall 05/12/2023   Atypical chest  pain 05/12/2023   Odynophagia 05/12/2023   Chronic combined systolic and diastolic congestive heart failure (HCC) 05/12/2023   SIRS (systemic inflammatory response syndrome) (HCC) 05/11/2023   Peripheral vascular disorder due to diabetes mellitus (HCC) 08/23/2021   Hypertension 08/05/2021   Dilated cardiomyopathy (HCC) 07/14/2021   Hypophosphatemia 07/13/2021   Subclinical hypothyroidism 07/13/2021   Protein-calorie malnutrition, severe 07/12/2021   Hypomagnesemia 07/12/2021   BPH (benign prostatic hyperplasia) 07/12/2021   COVID-19 virus infection 07/12/2021   Hyponatremia 07/12/2021   Thrombocytopenia (HCC) 07/12/2021   Normocytic anemia 07/12/2021   SVT (supraventricular tachycardia) (HCC) 07/11/2021   Palpitations 07/09/2021   Cardiac murmur 07/09/2021   Supraventricular tachycardia (HCC) 07/09/2021   Diet-controlled diabetes mellitus (HCC) 07/09/2021   Diabetes mellitus without complication (HCC) 07/06/2021   Essential hypertension 07/06/2021   Frailty 07/06/2021   Hardening of the aorta (main artery of the heart) (HCC) 07/06/2021   Osteoporosis 07/06/2021   Pathological fracture of vertebra 07/06/2021   Protein calorie malnutrition (HCC) 07/06/2021   Pure hypercholesterolemia 07/06/2021   Type 2 diabetes mellitus with other circulatory complications (HCC) 07/06/2021   Wears hearing aid in right ear 03/10/2020   Cochlear implant in place 02/24/2020   Sensorineural hearing loss (SNHL) of both ears 02/27/2018   Cerebrovascular disease 11/04/2016   Dizziness and giddiness 11/04/2016   Bilateral hearing loss 07/29/2015   Bilateral impacted cerumen 07/29/2015   PCP:  Joycelyn Rua, MD Pharmacy:   [id number] - [place], [place] - [address] [place] [address] Phone: [phone] Fax: [phone]  [facility] - [place], [place] - [address] [place] [address] Phone:  [phone] Fax: [phone]     Social Drivers of Health (SDOH) Social History: SDOH Screenings   Food Insecurity: No Food Insecurity (05/12/2023)  Housing: Low Risk  (05/12/2023)  Transportation Needs: No Transportation Needs (05/12/2023)  Utilities: Not At Risk (05/12/2023)  Depression (PHQ2-9): Low Risk  (06/03/2022)  Tobacco Use: Low Risk  (05/11/2023)   SDOH Interventions:     Readmission Risk Interventions    05/16/2023   11:19 AM 07/15/2021   10:20 AM  Readmission Risk Prevention Plan  Transportation Screening Complete Complete  PCP or Specialist Appt within 3-5 Days Complete Complete  HRI or Home Care Consult Complete Complete  Social Work Consult for Recovery Care Planning/Counseling Complete Complete  Palliative Care Screening Not Applicable Not Applicable  Medication Review Oceanographer) Referral to Pharmacy Complete

## 2023-05-16 NOTE — Progress Notes (Signed)
Speech Language Pathology Treatment: Dysphagia  Patient Details Name: Raymond Fowler MRN: [id number] DOB: [birth year] Today's Date: 05/16/2023 Time: 6962-9528 SLP Time Calculation (min) (ACUTE ONLY): 37 min  Assessment / Plan / Recommendation Clinical Impression  MD messaged this SLP asking for pt to be seen prior to dc today per wife request. SLP arrived to room, greeted pt who was in bed and wife who was in room. Pt with bilateral hearing appliances present - but continues with gross hearing loss. Printed MBS report x2 - one copy for [facility] SLP and one for family. In addition, provided wife with written information re: basics of dys3 diet- Reiterated that if pt does not drink he thickened liquids, we are NOT helping him and increasing risk of dehydration. Using teach back, advised pt to work on strength of "hock" - forced expectoration and cough. Pt requires cues to sit upright - as he is kyphotic. Provided information re: compensation strategies in writing and verbally, univeral sign of choking reviewed and use of "Life Vac" apparatus. HH SLP Indicated to maximize rehab of swallow function to improve pharyngeal motlity to decrease retention present from obstructive nature of his cervical osteophyte. All education completed using teach back.     HPI HPI: Raymond Fowler is an 89 y.o. male who presented to the emergency department with chest pain after getting a piece of meat stuck in his esophagus - per notes.  Radiologist advised barium swallow or CXR after swallowing contrast to determine if meat lodged as esophagus was normal on CT.  Dx SIRS, odynophagia.  PMHx bilateral hearing loss, bilateral cerumen impaction, cochlear implant, BPH, chronic indwelling Foley catheter, cardiac murmur, chronic combined systolic and diastolic heart failure, cerebrovascular disease, COVID-19 virus infection, type 2 diabetes, dilated cardiomyopathy, dizziness, palpitations, SVT, hypertension, aortic atherosclerosis,  hypomagnesemia, hyponatremia hypophosphatemia, normocytic anemia, osteoporosis, pathological fracture of vertebra, PVD, protein calorie malnutrition.  He has had a 40 pound unintentional weight loss since last year.      SLP Plan  Continue with current plan of care      Recommendations for follow up therapy are one component of a multi-disciplinary discharge planning process, led by the attending physician.  Recommendations may be updated based on patient status, additional functional criteria and insurance authorization.    Recommendations  Diet recommendations: Dysphagia 3 (mechanical soft);Thin liquid;Nectar-thick liquid Liquids provided via: Straw;Cup Medication Administration: Crushed with puree Supervision: Intermittent supervision to cue for compensatory strategies Compensations: Slow rate;Small sips/bites Postural Changes and/or Swallow Maneuvers: Out of bed for meals;Seated upright 90 degrees (work on cough and expectoration strength)                  Oral care BID   None Dysphagia, oropharyngeal phase (R13.12)     Continue with current plan of care   Rolena Infante, MS [facility] SLP Acute Rehab Services Office [phone]   Chales Abrahams  05/16/2023, 11:53 AM

## 2023-05-16 NOTE — Discharge Summary (Signed)
Physician Discharge Summary   Patient: Raymond Fowler MRN: [id number] DOB: November 09, 1932  Admit date:     05/11/2023  Discharge date: 05/16/23  Discharge Physician: Meredeth Ide   PCP: Joycelyn Rua, MD   Recommendations at discharge:    Follow up PCP in 2 weeks  Discharge Diagnoses: Principal Problem:   SIRS (systemic inflammatory response syndrome) (HCC) Active Problems:   Essential hypertension   Bilateral hearing loss   Supraventricular tachycardia (HCC)   Diet-controlled diabetes mellitus (HCC)   BPH (benign prostatic hyperplasia)   Hyponatremia   Normocytic anemia   Unintentional weight loss   Mild protein malnutrition (HCC)   Atypical chest pain   Odynophagia   Chronic combined systolic and diastolic congestive heart failure (HCC)  Resolved Problems:   * No resolved hospital problems. *  Hospital Course: 89 y.o. male with medical history significant of bilateral hearing loss,cochlear implant, BPH, chronic indwelling Foley catheter, chronic combined systolic and diastolic heart failure,  SVT, hypertension,PVD, protein calorie malnutrition, subclinical hypothyroidism, thrombocytopenia presented to the emergency department with chest pain after getting a piece of meat stuck in his esophagus.  He has had a 40 pound unintentional weight loss since last year.  In the ED, patient had slightly elevated blood pressure.  Lab showed mild leukocytosis at 17.7.  CMP showed mild hyponatremia at 131.  Creatinine slightly elevated at 1.2.  Urinalysis with some white cells in urine.  Chest x-ray without any acute findings. Patient received azithromycin 500 mg IVPB, ceftriaxone 1 g IVPB and Maalox plus viscous Xylocaine 2%, GI was consulted and was admitted hospital for further evaluation.     Assessment and Plan:  SIRS (systemic inflammatory response syndrome) Patient had leukocytosis on presentation with some tachypnea..   Concern for UTI.  CT of the chest showed trace left effusion with  compression deformities of C7 and T2 T4-T5-T6 and T7 which were chronic.  CT scan of the abdomen and pelvis with bibasilar atelectasis but no other source of infection.  Started on IV Rocephin, blood cultures negative to date.  No urine cultures obtained.   -Discontinued IV Rocephin   Unintentional weight loss   Mild protein malnutrition    Atypical chest pain likely secondary to  Odynophagia -GI was consulted, barium swallow ordered -Barium swallow was normal, GI signed off.  Speech therapy has recommended  dysphagia 3 diet.      Supraventricular tachycardia  Required metoprolol IV x 1.  On amiodarone and metoprolol.  Currently controlled.       Chronic combined systolic and diastolic congestive heart failure (HCC)  On Entresto 24-26 mg p.o. twice daily at home.   Continue metoprolol.     Hyponatremia Mild and chronic.  Asymptomatic.  Latest sodium of 130.      Normocytic anemia No evidence of bleeding.  Latest hemoglobin of 12.0.     Essential hypertension Continue metoprolol, blood pressure seems to be stable at this time. Started on Entresto today     Bilateral hearing loss History of cochlear implant.  Supportive care.     Diet-controlled diabetes mellitus (HCC) Diet controlled.     BPH (benign prostatic hyperplasia) On finasteride.   Debility, weakness.   Physical therapy recommends home health PT on discharge. -Will also get  home speech therapy         Consultants:  Procedures performed:  Disposition: Home Diet recommendation:  Discharge Diet Orders (From admission, onward)     Start     Ordered   05/16/23  0000  Diet - low sodium heart healthy        05/16/23 1414           Regular diet DISCHARGE MEDICATION: Allergies as of 05/16/2023   No Known Allergies      Medication List     STOP taking these medications    vitamin C 1000 MG tablet       TAKE these medications    alendronate 70 MG tablet Commonly known as: FOSAMAX Take 70  mg by mouth once a week.   amiodarone 100 MG tablet Commonly known as: PACERONE Take 1 tablet (100 mg total) by mouth daily.   aspirin EC 325 MG tablet Take 325 mg by mouth daily.   atorvastatin 10 MG tablet Commonly known as: LIPITOR Take 1 tablet (10 mg total) by mouth every evening.   brimonidine 0.2 % ophthalmic solution Commonly known as: ALPHAGAN Place 1 drop into both eyes 2 (two) times daily.   carboxymethylcellul-glycerin 0.5-0.9 % ophthalmic solution Commonly known as: REFRESH OPTIVE Place 1 drop into both eyes 2 (two) times daily.   finasteride 5 MG tablet Commonly known as: PROSCAR Take 5 mg by mouth daily.   Fish Oil 1200 MG Caps Take 1,200 mg by mouth daily.   Gemtesa 75 MG Tabs Generic drug: Vibegron Take 75 mg by mouth daily.   levothyroxine 50 MCG tablet Commonly known as: SYNTHROID Take 50 mcg by mouth daily before breakfast.   liothyronine 25 MCG tablet Commonly known as: CYTOMEL Take 25 mcg by mouth daily.   metoprolol succinate 25 MG 24 hr tablet Commonly known as: TOPROL-XL Take 0.5 tablets (12.5 mg total) by mouth daily. Take with or immediately following a meal.   multivitamin with minerals tablet Take 1 tablet by mouth daily.   pantoprazole 40 MG tablet Commonly known as: Protonix Take 1 tablet (40 mg total) by mouth daily.   polyethylene glycol 17 g packet Commonly known as: MiraLax Take 17 g by mouth daily.   sacubitril-valsartan 24-26 MG Commonly known as: ENTRESTO Take 1 tablet by mouth 2 (two) times daily.        Follow-up Information     Joycelyn Rua, MD .   Specialty: Family Medicine Contact information: [address] [place] [address] [phone]         [facility] .   Specialty: Gastroenterology Contact information: [address] [place] [address] [phone]        [facility] [place] [facility] Follow up.   Why: Your home  health has been set up with [facility]. The office will call you with start of service information. If you have questions or concerns please call [phone].               Discharge Exam: Filed Weights   05/11/23 1457  Weight: 49.9 kg   Appear in no acute distress S1s2 RRR Lungs CTA  Condition at discharge: good  The results of significant diagnostics from this hospitalization (including imaging, microbiology, ancillary and laboratory) are listed below for reference.   Imaging Studies: DG ESOPHAGUS W SINGLE CM (SOL OR THIN BA) Result Date: 05/15/2023 CLINICAL DATA:  Dysphagia, weight loss. EXAM: ESOPHOGRAM/BARIUM SWALLOW TECHNIQUE: Single contrast examination was performed using  thin barium. FLUOROSCOPY: Radiation Exposure Index (as provided by the fluoroscopic device): 2.5 mGy Kerma COMPARISON:  CT chest 05/12/2023 FINDINGS: Exam is limited due to patient immobility, inability to stand upright, and inability to takes  deficient sips contrast. No high-grade obstruction of the esophagus. Contrast passed freely into the stomach. No stricture or mass. IMPRESSION: 1. No esophageal stricture or mass. 2. Patent GE junction. 3. limited exam due to patient immobility. Electronically Signed   By: Genevive Bi M.D.   On: 05/15/2023 10:03   DG Swallowing Func-Speech Pathology Result Date: 05/13/2023 Table formatting from the original result was not included. Modified Barium Swallow Study Patient Details Name: Raymond Fowler MRN: [id number] Date of Birth: [birth year] Today's Date: 05/13/2023 HPI/PMH: HPI: Imari Wolbeck is an 89 y.o. male who presented to the emergency department with chest pain after getting a piece of meat stuck in his esophagus - per notes.  Radiologist advised barium swallow or CXR after swallowing contrast to determine if meat lodged as esophagus was normal on CT.  Dx SIRS, odynophagia.  PMHx bilateral hearing loss, bilateral cerumen impaction, cochlear implant, BPH, chronic  indwelling Foley catheter, cardiac murmur, chronic combined systolic and diastolic heart failure, cerebrovascular disease, COVID-19 virus infection, type 2 diabetes, dilated cardiomyopathy, dizziness, palpitations, SVT, hypertension, aortic atherosclerosis, hypomagnesemia, hyponatremia hypophosphatemia, normocytic anemia, osteoporosis, pathological fracture of vertebra, PVD, protein calorie malnutrition.  He has had a 40 pound unintentional weight loss since last year. Clinical Impression: Clinical Impression: Patient presents with mild oropharyngeal dysphagia.  Dysphagia characterized by impaired pharyngeal motility, tongue base retraction, laryngeal closure resulting in pharyngeal retention and mild amount of penetration of thin and nectar thick liquids.  Trace aspiration of thin, nectar mixed with secretions noted just below the vocal cord. Patient benefits from following solid with liquid to help transit vallecular retention into esophagus.  He does not sense retention in pharynx.  Barium tablet given with nectar thick liquid did not transit into esophagus and was noted on anterior posterior view to be in the right vallecular space.  Multiple compensation strategies attempted including providing patient with pudding, nectar thick and thin barium as well as water and soda with various postures including chin tuck head turn right head turn left not being effective.  Again patient is not sensate to barium tablet halting thus recommend if not contraindicated patient be given medicine crushed if it is any larger than 13 mm in size.  Upon esophageal sweep patient appears with some liquid retention distally, MBS does not diagnose below UES.  Recommend patient continue dysphagia 3 diet with nectar thick liquids but allow thin water between meals freely and follow-up with speech pathologist at next venue of care to strengthen his swallow. Factors that may increase risk of adverse event in presence of aspiration Rubye Oaks &  Clearance Coots 2021): Factors that may increase risk of adverse event in presence of aspiration Rubye Oaks & Clearance Coots 2021): Frail or deconditioned; Reduced saliva Recommendations/Plan: Swallowing Evaluation Recommendations Swallowing Evaluation Recommendations Recommendations: PO diet PO Diet Recommendation: Dysphagia 3 (Mechanical soft); Mildly thick liquids (Level 2, nectar thick) (thin water between meals ok) Liquid Administration via: Cup; Straw Medication Administration: Crushed with puree Supervision: Patient able to self-feed Swallowing strategies  : Slow rate; Small bites/sips; effortful swallow; Multiple dry swallows after each bite/sip Postural changes: Position pt fully upright for meals; Stay upright 30-60 min after meals Oral care recommendations: Oral care BID (2x/day) Treatment Plan Treatment Plan Treatment recommendations: Therapy as outlined in treatment plan below Follow-up recommendations: No SLP follow up Functional status assessment: Patient has had a recent decline in their functional status and demonstrates the ability to make significant improvements in function in a reasonable and predictable amount of time. Treatment frequency: Min 1x/week  Treatment duration: 1 week Interventions: Aspiration precaution training; Patient/family education; Compensatory techniques Recommendations Recommendations for follow up therapy are one component of a multi-disciplinary discharge planning process, led by the attending physician.  Recommendations may be updated based on patient status, additional functional criteria and insurance authorization. Assessment: Orofacial Exam: Orofacial Exam Oral Cavity: Oral Hygiene: WFL Oral Cavity - Dentition: Adequate natural dentition (some missing) Orofacial Anatomy: WFL Oral Motor/Sensory Function: Suspected cranial nerve impairment (left facial asymmetry) CN V - Trigeminal: WFL CN VII - Facial: -- (left facial asymmetry) CN XII - Hypoglossal: WFL Anatomy: Anatomy: WFL Boluses  Administered: Boluses Administered Boluses Administered: Thin liquids (Level 0); Mildly thick liquids (Level 2, nectar thick); Moderately thick liquids (Level 3, honey thick); Puree; Solid  Oral Impairment Domain: Oral Impairment Domain Lip Closure: No labial escape Tongue control during bolus hold: Posterior escape of less than half of bolus Bolus preparation/mastication: Slow prolonged chewing/mashing with complete recollection Bolus transport/lingual motion: Delayed initiation of tongue motion (oral holding) Oral residue: Residue collection on oral structures; Trace residue lining oral structures Location of oral residue : Tongue Initiation of pharyngeal swallow : Pyriform sinuses  Pharyngeal Impairment Domain: Pharyngeal Impairment Domain Soft palate elevation: No bolus between soft palate (SP)/pharyngeal wall (PW) Laryngeal elevation: Partial superior movement of thyroid cartilage/partial approximation of arytenoids to epiglottic petiole Anterior hyoid excursion: Partial anterior movement Epiglottic movement: Partial inversion Laryngeal vestibule closure: Incomplete, narrow column air/contrast in laryngeal vestibule Pharyngeal stripping wave : Present - diminished Pharyngeal contraction (A/P view only): -- (incomplete) Pharyngoesophageal segment opening: Partial distention/partial duration, partial obstruction of flow Tongue base retraction: Trace column of contrast or air between tongue base and PPW Pharyngeal residue: Trace residue within or on pharyngeal structures Location of pharyngeal residue: Valleculae; Pyriform sinuses  Esophageal Impairment Domain: Esophageal Impairment Domain Esophageal clearance upright position: Complete clearance, esophageal coating Pill: Pill Consistency administered: Mildly thick liquids (Level 2, nectar thick) Mildly thick liquids (Level 2, nectar thick): WFL Penetration/Aspiration Scale Score: Penetration/Aspiration Scale Score 1.  Material does not enter airway: Puree; Solid  2.  Material enters airway, remains ABOVE vocal cords then ejected out: Mildly thick liquids (Level 2, nectar thick) 8.  Material enters airway, passes BELOW cords without attempt by patient to eject out (silent aspiration) : Thin liquids (Level 0) Compensatory Strategies: Compensatory Strategies Compensatory strategies: Yes Chin tuck: Ineffective Ineffective Chin Tuck: Pill Right head turn: Ineffective Ineffective Right Head Turn: Pill   General Information: Caregiver present: Yes  Diet Prior to this Study: Dysphagia 3 (mechanical soft); Mildly thick liquids (Level 2, nectar thick)   Temperature : Normal   Respiratory Status: WFL   Supplemental O2: Nasal cannula   History of Recent Intubation: No  Behavior/Cognition: Alert; Cooperative; Pleasant mood Self-Feeding Abilities: Able to self-feed Baseline vocal quality/speech: Normal Volitional Cough: Able to elicit Volitional Swallow: Able to elicit No data recorded Goal Planning: Prognosis for improved oropharyngeal function: Good Barriers to Reach Goals: Time post onset; Severity of deficits; Motivation No data recorded Patient/Family Stated Goal: none stated No data recorded Pain: Pain Assessment Pain Assessment: No/denies pain End of Session: Start Time:SLP Start Time (ACUTE ONLY): 1449 Stop Time: SLP Stop Time (ACUTE ONLY): 1520 Time Calculation:SLP Time Calculation (min) (ACUTE ONLY): 31 min Charges: SLP Evaluations $ SLP Speech Visit: 1 Visit SLP Evaluations $BSS Swallow: 1 Procedure $MBS Swallow: 1 Procedure $Swallowing Treatment: 1 Procedure SLP visit diagnosis: SLP Visit Diagnosis: Dysphagia, oropharyngeal phase (R13.12) Past Medical History: Past Medical History: Diagnosis Date  Bilateral hearing loss 07/29/2015  Bilateral impacted cerumen 07/29/2015  BPH (benign prostatic hyperplasia) 07/12/2021  Cardiac murmur 07/09/2021  Cerebrovascular disease 11/04/2016  Cochlear implant in place 02/24/2020  COVID-19 virus infection 07/12/2021  Diabetes mellitus without  complication (HCC)   Diet-controlled diabetes mellitus (HCC) 07/09/2021  Dilated cardiomyopathy (HCC) 07/14/2021  Dizziness and giddiness 11/04/2016  Essential hypertension 07/06/2021  Frailty 07/06/2021  Hardening of the aorta (main artery of the heart) (HCC) 07/06/2021  Hypertension   Hypomagnesemia 07/12/2021  Hyponatremia 07/12/2021  Hypophosphatemia 07/13/2021  Normocytic anemia 07/12/2021  Osteoporosis 07/06/2021  Palpitations 07/09/2021  Pathological fracture of vertebra 07/06/2021  Peripheral vascular disorder due to diabetes mellitus (HCC) 08/23/2021  Protein calorie malnutrition (HCC) 07/06/2021  Protein-calorie malnutrition, severe 07/12/2021  Pure hypercholesterolemia 07/06/2021  Sensorineural hearing loss (SNHL) of both ears 02/27/2018  Subclinical hypothyroidism 07/13/2021  Supraventricular tachycardia (HCC) 07/09/2021  SVT (supraventricular tachycardia) (HCC) 07/11/2021  Thrombocytopenia (HCC) 07/12/2021  Type 2 diabetes mellitus with other circulatory complications (HCC) 07/06/2021  Wears hearing aid in right ear 03/10/2020 Past Surgical History: Past Surgical History: Procedure Laterality Date  APPENDECTOMY    CHOLECYSTECTOMY    COCHLEAR IMPLANT    EYE SURGERY    FRACTURE SURGERY    HERNIA REPAIR    TONSILLECTOMY   Rolena Infante, MS [facility] SLP Acute Rehab Services Office [phone] Chales Abrahams 05/13/2023, 6:05 PM  CT CHEST WO CONTRAST Result Date: 05/12/2023 CLINICAL DATA:  Chest wall pain EXAM: CT CHEST WITHOUT CONTRAST TECHNIQUE: Multidetector CT imaging of the chest was performed following the standard protocol without IV contrast. RADIATION DOSE REDUCTION: This exam was performed according to the departmental dose-optimization program which includes automated exposure control, adjustment of the mA and/or kV according to patient size and/or use of iterative reconstruction technique. COMPARISON:  CT chest abdomen and pelvis 10/02/2022 and CT chest abdomen and pelvis 02/19/2021. FINDINGS: Cardiovascular: No significant  vascular findings. Normal heart size. No pericardial effusion. There are atherosclerotic calcifications of the aorta and coronary arteries. Mediastinum/Nodes: There is a posterior hypodense left thyroid nodule measuring up to 2.1 cm which appears unchanged from prior examination. No enlarged mediastinal or hilar lymph nodes are seen. There are calcified right hilar lymph nodes. The esophagus is within normal limits. Lungs/Pleura: There is a trace left pleural effusion which is new from prior examination. There some dependent atelectasis in the bilateral lower lobes. There are biapical calcified pleural plaques. There are calcified granulomas in the right middle lobe and left lower lobe. There is no focal lung consolidation. There is no pneumothorax. Upper Abdomen: There some residual contrast seen within both kidneys. There is a 18 mm cyst in the right lobe of the liver. Musculoskeletal: There are multiple healed right lower rib fractures. No acute fractures are seen. There stable chronic compression deformities of T2, T4, T5, T6 and T7 as well as C7. The bones are diffusely osteopenic. IMPRESSION: 1. New trace left pleural effusion. 2. Stable chronic compression deformities of C7, T2, T4, T5, T6 and T7. 3. Stable 2.1 cm left thyroid nodule. No follow-up necessary given stability. (Ref: J Am Coll Radiol. 2015 Feb;12(2): 143-50). 4. Aortic atherosclerosis. Aortic Atherosclerosis (ICD10-I70.0). Electronically Signed   By: Darliss Cheney M.D.   On: 05/12/2023 22:25   CT ABDOMEN PELVIS W CONTRAST Result Date: 05/12/2023 CLINICAL DATA:  Sepsis, 40 pound weight loss 1 year.  Dysphagia. EXAM: CT ABDOMEN AND PELVIS WITH CONTRAST TECHNIQUE: Multidetector CT imaging of the abdomen and pelvis was performed using the standard protocol following bolus administration of intravenous contrast. RADIATION DOSE  REDUCTION: This exam was performed according to the departmental dose-optimization program which includes automated  exposure control, adjustment of the mA and/or kV according to patient size and/or use of iterative reconstruction technique. CONTRAST:  OMNIPAQUE IOHEXOL 300 MG/ML  SOLN COMPARISON:  CT 03/16/2023 FINDINGS: Lower chest: Bibasilar atelectasis greater on the LEFT. Small bilateral pleural effusions. Hepatobiliary: Multiple benign hepatic cysts. Postcholecystectomy. No biliary duct dilatation. Pancreas: Pancreas is normal. No ductal dilatation. No pancreatic inflammation. Spleen: Normal spleen Adrenals/urinary tract: Adrenal glands and kidneys are normal. Ureters normal. Foley catheter in the bladder. Gas in the bladder likely related to catheterization. Dense material in the dependent surface of the bladder likely represents calcific sludge. Stomach/Bowel: The stomach, duodenum, and small bowel normal. Multiple diverticula of the descending colon and sigmoid colon without acute inflammation. Vascular/Lymphatic: Abdominal aorta is normal caliber with atherosclerotic calcification. There is no retroperitoneal or periportal lymphadenopathy. No pelvic lymphadenopathy. Reproductive: Prostate unremarkable Other: No free fluid. Musculoskeletal: No aggressive osseous lesion. IMPRESSION: 1. No clear evidence of infection. Bibasilar atelectasis and small effusions greater on the LEFT. 2. Calcific sludge within the urinary bladder. Foley catheter within the bladder. No renal infection identified. Electronically Signed   By: Genevive Bi M.D.   On: 05/12/2023 16:41   DG Chest Portable 1 View Result Date: 05/11/2023 CLINICAL DATA:  Chest pain, possible food impaction EXAM: PORTABLE CHEST 1 VIEW COMPARISON:  10/02/2022 FINDINGS: Upright frontal view of the chest demonstrates a stable cardiac silhouette. Continued ectasia of the thoracic aorta. No airspace disease, effusion, or pneumothorax. Chronic scarring or atelectasis at the left lung base, with stable elevation of the left hemidiaphragm. No acute bony  abnormalities. IMPRESSION: 1. No acute intrathoracic process. 2. If food impaction remains a clinical concern, esophagram or upright chest x-ray after oral contrast ingestion could be performed. Electronically Signed   By: Sharlet Salina M.D.   On: 05/11/2023 16:17    Microbiology: Results for orders placed or performed during the hospital encounter of 05/11/23  Resp panel by RT-PCR (RSV, Flu A&B, Covid)     Status: None   Collection Time: 05/11/23  7:48 PM   Specimen: Nasal Swab  Result Value Ref Range Status   SARS Coronavirus 2 by RT PCR NEGATIVE NEGATIVE Final    Comment: (NOTE) SARS-CoV-2 target nucleic acids are NOT DETECTED.  The SARS-CoV-2 RNA is generally detectable in upper respiratory specimens during the acute phase of infection. The lowest concentration of SARS-CoV-2 viral copies this assay can detect is 138 copies/mL. A negative result does not preclude SARS-Cov-2 infection and should not be used as the sole basis for treatment or other patient management decisions. A negative result may occur with  improper specimen collection/handling, submission of specimen other than nasopharyngeal swab, presence of viral mutation(s) within the areas targeted by this assay, and inadequate number of viral copies(<138 copies/mL). A negative result must be combined with clinical observations, patient history, and epidemiological information. The expected result is Negative.  Fact Sheet for Patients:  BloggerCourse.com  Fact Sheet for Healthcare Providers:  SeriousBroker.it  This test is no t yet approved or cleared by the [place] FDA and  has been authorized for detection and/or diagnosis of SARS-CoV-2 by FDA under an Emergency Use Authorization (EUA). This EUA will remain  in effect (meaning this test can be used) for the duration of the COVID-19 declaration under Section 564(b)(1) of the Act, 21 U.S.C.section 360bbb-3(b)(1),  unless the authorization is terminated  or revoked sooner.  Influenza A by PCR NEGATIVE NEGATIVE Final   Influenza B by PCR NEGATIVE NEGATIVE Final    Comment: (NOTE) The Xpert Xpress SARS-CoV-2/FLU/RSV plus assay is intended as an aid in the diagnosis of influenza from Nasopharyngeal swab specimens and should not be used as a sole basis for treatment. Nasal washings and aspirates are unacceptable for Xpert Xpress SARS-CoV-2/FLU/RSV testing.  Fact Sheet for Patients: BloggerCourse.com  Fact Sheet for Healthcare Providers: SeriousBroker.it  This test is not yet approved or cleared by the [place] FDA and has been authorized for detection and/or diagnosis of SARS-CoV-2 by FDA under an Emergency Use Authorization (EUA). This EUA will remain in effect (meaning this test can be used) for the duration of the COVID-19 declaration under Section 564(b)(1) of the Act, 21 U.S.C. section 360bbb-3(b)(1), unless the authorization is terminated or revoked.     Resp Syncytial Virus by PCR NEGATIVE NEGATIVE Final    Comment: (NOTE) Fact Sheet for Patients: BloggerCourse.com  Fact Sheet for Healthcare Providers: SeriousBroker.it  This test is not yet approved or cleared by the [place] FDA and has been authorized for detection and/or diagnosis of SARS-CoV-2 by FDA under an Emergency Use Authorization (EUA). This EUA will remain in effect (meaning this test can be used) for the duration of the COVID-19 declaration under Section 564(b)(1) of the Act, 21 U.S.C. section 360bbb-3(b)(1), unless the authorization is terminated or revoked.  Performed at [facility], [address] [place], [place] [address]   Blood culture (routine x 2)     Status: None   Collection Time: 05/11/23  7:48 PM   Specimen: BLOOD LEFT HAND  Result Value Ref Range Status   Specimen  Description   Final    BLOOD LEFT HAND Performed at [facility], [address] [place], [place] [address]    Special Requests   Final    BOTTLES DRAWN AEROBIC AND ANAEROBIC Blood Culture adequate volume Performed at [facility], [address] [place], [place] [address]    Culture   Final    NO GROWTH 5 DAYS Performed at [facility], [address] [place], [place] [address]    Report Status 05/16/2023 FINAL  Final  Blood culture (routine x 2)     Status: None   Collection Time: 05/11/23  7:48 PM   Specimen: BLOOD RIGHT FOREARM  Result Value Ref Range Status   Specimen Description   Final    BLOOD RIGHT FOREARM Performed at [facility], [address] [place], [place] [address]    Special Requests   Final    BOTTLES DRAWN AEROBIC AND ANAEROBIC Blood Culture results may not be optimal due to an inadequate volume of blood received in culture bottles Performed at [facility], [address] [place], [place] [address]    Culture   Final    NO GROWTH 5 DAYS Performed at [facility], [address], [place], [place] [address]    Report Status 05/16/2023 FINAL  Final    Labs: CBC: Recent Labs  Lab 05/11/23 1504 05/12/23 0557 05/13/23 0747 05/14/23 0645  WBC 17.7* 20.1* 11.3* 7.8  NEUTROABS  --  17.2*  --   --   HGB 13.8 12.9* 12.0* 12.0*  HCT 40.6 38.5* 35.6* 36.1*  MCV 90.4 91.7 91.8 91.9  PLT 265 220 206 221   Basic Metabolic Panel: Recent Labs  Lab 05/11/23 1504  05/12/23 0557 05/13/23 0747 05/14/23 0645  NA 131* 133* 133* 130*  K 4.5 3.8 4.1 3.6  CL 96* 100 103 99  CO2 25 24 24 25   GLUCOSE 217* 137* 133* 114*  BUN 19 19 23 18   CREATININE 1.27* 0.90 1.07 0.59*  CALCIUM 8.8* 8.3* 8.1* 8.1*  MG  --  2.1  --  1.9  PHOS  --  2.9  --   --    Liver Function Tests: Recent Labs  Lab 05/12/23 0557 05/13/23 0747  AST 19 17  ALT 15 15  ALKPHOS 37* 39  BILITOT 1.0 0.7  PROT 6.2* 5.9*  ALBUMIN  3.2* 2.9*   CBG: Recent Labs  Lab 05/15/23 0636 05/15/23 1121 05/15/23 2353 05/16/23 0554 05/16/23 1127  GLUCAP 127* 136* 106* 108* 170*    Discharge time spent: greater than 30 minutes.  Signed: Meredeth Ide, MD [facility] 05/16/2023

## 2023-05-16 NOTE — TOC Progression Note (Signed)
Transition of Care [facility]) - Progression Note    Patient Details  Name: Raymond Fowler MRN: [id number] Date of Birth: [birth year]  Transition of Care [facility]) CM/SW Contact  Beckie Busing, RN Phone Number:[phone]  05/16/2023, 11:33 AM  Clinical Narrative:    CM at bedside to offer choice for home health services. Patient and spouse have no preference as long as agency has speech therapy. Referral has been accepted by Angie with [facility]. AVS has been updated.    Expected Discharge Plan: Home w Home Health Services Barriers to Discharge: No Barriers Identified  Expected Discharge Plan and Services In-house Referral: NA Discharge Planning Services: CM Consult Post Acute Care Choice: Home Health Living arrangements for the past 2 months: Single Family Home                 DME Arranged: N/A DME Agency: NA       HH Arranged: PT, Speech Therapy HH Agency: Other - See comment Producer, television/film/video) Date HH Agency Contacted: 05/17/23 Time HH Agency Contacted: 1124 Representative spoke with at [facility] Agency: Angie   Social Determinants of Health (SDOH) Interventions SDOH Screenings   Food Insecurity: No Food Insecurity (05/12/2023)  Housing: Low Risk  (05/12/2023)  Transportation Needs: No Transportation Needs (05/12/2023)  Utilities: Not At Risk (05/12/2023)  Depression (PHQ2-9): Low Risk  (06/03/2022)  Tobacco Use: Low Risk  (05/11/2023)    Readmission Risk Interventions    05/16/2023   11:19 AM 07/15/2021   10:20 AM  Readmission Risk Prevention Plan  Transportation Screening Complete Complete  PCP or Specialist Appt within 3-5 Days Complete Complete  HRI or Home Care Consult Complete Complete  Social Work Consult for Recovery Care Planning/Counseling Complete Complete  Palliative Care Screening Not Applicable Not Applicable  Medication Review Oceanographer) Referral to Pharmacy Complete

## 2023-05-16 NOTE — Plan of Care (Signed)
  Problem: Activity: Goal: Risk for activity intolerance will decrease Outcome: Progressing   Problem: Coping: Goal: Level of anxiety will decrease Outcome: Progressing   Problem: Elimination: Goal: Will not experience complications related to urinary retention Outcome: Progressing   

## 2023-05-16 NOTE — TOC Transition Note (Signed)
Transition of Care [facility]) - Discharge Note   Patient Details  Name: Kasra Hellberg MRN: [id number] Date of Birth: Feb 01, 1933  Transition of Care [facility]) CM/SW Contact:  Beckie Busing, RN Phone [phone]  05/16/2023, 11:47 AM   Clinical Narrative:    Patient is discharging home with spouse. No DME needs HH has been set up. There are no other TOC needs. TOC will sign off.    Final next level of care: Home w Home Health Services Barriers to Discharge: No Barriers Identified   Patient Goals and CMS Choice Patient states their goals for this hospitalization and ongoing recovery are:: Wants to get better and go home. CMS Medicare.gov Compare Post Acute Care list provided to:: Patient Choice offered to / list presented to : Spouse [employer] ownership interest in [facility].provided to:: Spouse    Discharge Placement                       Discharge Plan and Services Additional resources added to the After Visit Summary for   In-house Referral: NA Discharge Planning Services: CM Consult Post Acute Care Choice: Home Health          DME Arranged: N/A DME Agency: NA       HH Arranged: PT, Speech Therapy HH Agency: Other - See comment Cindie Laroche) Date HH Agency Contacted: 05/17/23 Time HH Agency Contacted: 1124 Representative spoke with at [facility] Agency: Angie  Social Drivers of Health (SDOH) Interventions SDOH Screenings   Food Insecurity: No Food Insecurity (05/12/2023)  Housing: Low Risk  (05/12/2023)  Transportation Needs: No Transportation Needs (05/12/2023)  Utilities: Not At Risk (05/12/2023)  Depression (PHQ2-9): Low Risk  (06/03/2022)  Tobacco Use: Low Risk  (05/11/2023)     Readmission Risk Interventions    05/16/2023   11:19 AM 07/15/2021   10:20 AM  Readmission Risk Prevention Plan  Transportation Screening Complete Complete  PCP or Specialist Appt within 3-5 Days Complete Complete  HRI or Home Care Consult Complete Complete  Social Work  Consult for Recovery Care Planning/Counseling Complete Complete  Palliative Care Screening Not Applicable Not Applicable  Medication Review Oceanographer) Referral to Pharmacy Complete

## 2023-05-17 DIAGNOSIS — R6510 Systemic inflammatory response syndrome (SIRS) of non-infectious origin without acute organ dysfunction: Secondary | ICD-10-CM | POA: Diagnosis not present

## 2023-05-17 DIAGNOSIS — Z681 Body mass index (BMI) 19 or less, adult: Secondary | ICD-10-CM | POA: Diagnosis not present

## 2023-05-17 DIAGNOSIS — D649 Anemia, unspecified: Secondary | ICD-10-CM | POA: Diagnosis not present

## 2023-05-17 DIAGNOSIS — R634 Abnormal weight loss: Secondary | ICD-10-CM | POA: Diagnosis not present

## 2023-05-17 DIAGNOSIS — E441 Mild protein-calorie malnutrition: Secondary | ICD-10-CM | POA: Diagnosis not present

## 2023-05-17 DIAGNOSIS — I340 Nonrheumatic mitral (valve) insufficiency: Secondary | ICD-10-CM | POA: Diagnosis not present

## 2023-05-17 DIAGNOSIS — E1151 Type 2 diabetes mellitus with diabetic peripheral angiopathy without gangrene: Secondary | ICD-10-CM | POA: Diagnosis not present

## 2023-05-17 DIAGNOSIS — I110 Hypertensive heart disease with heart failure: Secondary | ICD-10-CM | POA: Diagnosis not present

## 2023-05-17 DIAGNOSIS — R1312 Dysphagia, oropharyngeal phase: Secondary | ICD-10-CM | POA: Diagnosis not present

## 2023-05-17 DIAGNOSIS — Z7982 Long term (current) use of aspirin: Secondary | ICD-10-CM | POA: Diagnosis not present

## 2023-05-17 DIAGNOSIS — E038 Other specified hypothyroidism: Secondary | ICD-10-CM | POA: Diagnosis not present

## 2023-05-17 DIAGNOSIS — K7689 Other specified diseases of liver: Secondary | ICD-10-CM | POA: Diagnosis not present

## 2023-05-17 DIAGNOSIS — Z87310 Personal history of (healed) osteoporosis fracture: Secondary | ICD-10-CM | POA: Diagnosis not present

## 2023-05-17 DIAGNOSIS — I2510 Atherosclerotic heart disease of native coronary artery without angina pectoris: Secondary | ICD-10-CM | POA: Diagnosis not present

## 2023-05-17 DIAGNOSIS — I2584 Coronary atherosclerosis due to calcified coronary lesion: Secondary | ICD-10-CM | POA: Diagnosis not present

## 2023-05-17 DIAGNOSIS — M810 Age-related osteoporosis without current pathological fracture: Secondary | ICD-10-CM | POA: Diagnosis not present

## 2023-05-17 DIAGNOSIS — Z8616 Personal history of COVID-19: Secondary | ICD-10-CM | POA: Diagnosis not present

## 2023-05-17 DIAGNOSIS — I700 Atherosclerosis of aorta: Secondary | ICD-10-CM | POA: Diagnosis not present

## 2023-05-17 DIAGNOSIS — I351 Nonrheumatic aortic (valve) insufficiency: Secondary | ICD-10-CM | POA: Diagnosis not present

## 2023-05-17 DIAGNOSIS — I5042 Chronic combined systolic (congestive) and diastolic (congestive) heart failure: Secondary | ICD-10-CM | POA: Diagnosis not present

## 2023-05-17 DIAGNOSIS — Z7983 Long term (current) use of bisphosphonates: Secondary | ICD-10-CM | POA: Diagnosis not present

## 2023-05-17 DIAGNOSIS — E871 Hypo-osmolality and hyponatremia: Secondary | ICD-10-CM | POA: Diagnosis not present

## 2023-05-17 DIAGNOSIS — Z8673 Personal history of transient ischemic attack (TIA), and cerebral infarction without residual deficits: Secondary | ICD-10-CM | POA: Diagnosis not present

## 2023-05-19 DIAGNOSIS — R338 Other retention of urine: Secondary | ICD-10-CM | POA: Diagnosis not present

## 2023-05-19 DIAGNOSIS — I5042 Chronic combined systolic (congestive) and diastolic (congestive) heart failure: Secondary | ICD-10-CM | POA: Diagnosis not present

## 2023-05-19 DIAGNOSIS — E1151 Type 2 diabetes mellitus with diabetic peripheral angiopathy without gangrene: Secondary | ICD-10-CM | POA: Diagnosis not present

## 2023-05-19 DIAGNOSIS — R1312 Dysphagia, oropharyngeal phase: Secondary | ICD-10-CM | POA: Diagnosis not present

## 2023-05-19 DIAGNOSIS — K7689 Other specified diseases of liver: Secondary | ICD-10-CM | POA: Diagnosis not present

## 2023-05-19 DIAGNOSIS — I700 Atherosclerosis of aorta: Secondary | ICD-10-CM | POA: Diagnosis not present

## 2023-05-19 DIAGNOSIS — I110 Hypertensive heart disease with heart failure: Secondary | ICD-10-CM | POA: Diagnosis not present

## 2023-05-20 DIAGNOSIS — K7689 Other specified diseases of liver: Secondary | ICD-10-CM | POA: Diagnosis not present

## 2023-05-20 DIAGNOSIS — I110 Hypertensive heart disease with heart failure: Secondary | ICD-10-CM | POA: Diagnosis not present

## 2023-05-20 DIAGNOSIS — R1312 Dysphagia, oropharyngeal phase: Secondary | ICD-10-CM | POA: Diagnosis not present

## 2023-05-20 DIAGNOSIS — I700 Atherosclerosis of aorta: Secondary | ICD-10-CM | POA: Diagnosis not present

## 2023-05-20 DIAGNOSIS — I5042 Chronic combined systolic (congestive) and diastolic (congestive) heart failure: Secondary | ICD-10-CM | POA: Diagnosis not present

## 2023-05-20 DIAGNOSIS — E1151 Type 2 diabetes mellitus with diabetic peripheral angiopathy without gangrene: Secondary | ICD-10-CM | POA: Diagnosis not present

## 2023-05-22 ENCOUNTER — Encounter: Payer: Self-pay | Admitting: Pediatrics

## 2023-05-22 DIAGNOSIS — R6510 Systemic inflammatory response syndrome (SIRS) of non-infectious origin without acute organ dysfunction: Secondary | ICD-10-CM | POA: Diagnosis not present

## 2023-05-22 DIAGNOSIS — R54 Age-related physical debility: Secondary | ICD-10-CM | POA: Diagnosis not present

## 2023-05-22 DIAGNOSIS — I10 Essential (primary) hypertension: Secondary | ICD-10-CM | POA: Diagnosis not present

## 2023-05-22 DIAGNOSIS — E441 Mild protein-calorie malnutrition: Secondary | ICD-10-CM | POA: Diagnosis not present

## 2023-05-25 DIAGNOSIS — I5042 Chronic combined systolic (congestive) and diastolic (congestive) heart failure: Secondary | ICD-10-CM | POA: Diagnosis not present

## 2023-05-25 DIAGNOSIS — I700 Atherosclerosis of aorta: Secondary | ICD-10-CM | POA: Diagnosis not present

## 2023-05-25 DIAGNOSIS — R1312 Dysphagia, oropharyngeal phase: Secondary | ICD-10-CM | POA: Diagnosis not present

## 2023-05-25 DIAGNOSIS — E1151 Type 2 diabetes mellitus with diabetic peripheral angiopathy without gangrene: Secondary | ICD-10-CM | POA: Diagnosis not present

## 2023-05-25 DIAGNOSIS — I110 Hypertensive heart disease with heart failure: Secondary | ICD-10-CM | POA: Diagnosis not present

## 2023-05-25 DIAGNOSIS — K7689 Other specified diseases of liver: Secondary | ICD-10-CM | POA: Diagnosis not present

## 2023-05-27 DIAGNOSIS — R1312 Dysphagia, oropharyngeal phase: Secondary | ICD-10-CM | POA: Diagnosis not present

## 2023-05-27 DIAGNOSIS — I5042 Chronic combined systolic (congestive) and diastolic (congestive) heart failure: Secondary | ICD-10-CM | POA: Diagnosis not present

## 2023-05-27 DIAGNOSIS — I110 Hypertensive heart disease with heart failure: Secondary | ICD-10-CM | POA: Diagnosis not present

## 2023-05-27 DIAGNOSIS — K7689 Other specified diseases of liver: Secondary | ICD-10-CM | POA: Diagnosis not present

## 2023-05-27 DIAGNOSIS — E1151 Type 2 diabetes mellitus with diabetic peripheral angiopathy without gangrene: Secondary | ICD-10-CM | POA: Diagnosis not present

## 2023-05-27 DIAGNOSIS — I700 Atherosclerosis of aorta: Secondary | ICD-10-CM | POA: Diagnosis not present

## 2023-05-30 DIAGNOSIS — I110 Hypertensive heart disease with heart failure: Secondary | ICD-10-CM | POA: Diagnosis not present

## 2023-05-30 DIAGNOSIS — R1312 Dysphagia, oropharyngeal phase: Secondary | ICD-10-CM | POA: Diagnosis not present

## 2023-05-30 DIAGNOSIS — K7689 Other specified diseases of liver: Secondary | ICD-10-CM | POA: Diagnosis not present

## 2023-05-30 DIAGNOSIS — I700 Atherosclerosis of aorta: Secondary | ICD-10-CM | POA: Diagnosis not present

## 2023-05-30 DIAGNOSIS — I5042 Chronic combined systolic (congestive) and diastolic (congestive) heart failure: Secondary | ICD-10-CM | POA: Diagnosis not present

## 2023-05-30 DIAGNOSIS — E1151 Type 2 diabetes mellitus with diabetic peripheral angiopathy without gangrene: Secondary | ICD-10-CM | POA: Diagnosis not present

## 2023-06-01 DIAGNOSIS — K7689 Other specified diseases of liver: Secondary | ICD-10-CM | POA: Diagnosis not present

## 2023-06-01 DIAGNOSIS — I700 Atherosclerosis of aorta: Secondary | ICD-10-CM | POA: Diagnosis not present

## 2023-06-01 DIAGNOSIS — I5042 Chronic combined systolic (congestive) and diastolic (congestive) heart failure: Secondary | ICD-10-CM | POA: Diagnosis not present

## 2023-06-01 DIAGNOSIS — R1312 Dysphagia, oropharyngeal phase: Secondary | ICD-10-CM | POA: Diagnosis not present

## 2023-06-01 DIAGNOSIS — I110 Hypertensive heart disease with heart failure: Secondary | ICD-10-CM | POA: Diagnosis not present

## 2023-06-01 DIAGNOSIS — E1151 Type 2 diabetes mellitus with diabetic peripheral angiopathy without gangrene: Secondary | ICD-10-CM | POA: Diagnosis not present

## 2023-06-02 DIAGNOSIS — R1312 Dysphagia, oropharyngeal phase: Secondary | ICD-10-CM | POA: Diagnosis not present

## 2023-06-02 DIAGNOSIS — K7689 Other specified diseases of liver: Secondary | ICD-10-CM | POA: Diagnosis not present

## 2023-06-02 DIAGNOSIS — I700 Atherosclerosis of aorta: Secondary | ICD-10-CM | POA: Diagnosis not present

## 2023-06-02 DIAGNOSIS — I5042 Chronic combined systolic (congestive) and diastolic (congestive) heart failure: Secondary | ICD-10-CM | POA: Diagnosis not present

## 2023-06-02 DIAGNOSIS — I110 Hypertensive heart disease with heart failure: Secondary | ICD-10-CM | POA: Diagnosis not present

## 2023-06-02 DIAGNOSIS — E1151 Type 2 diabetes mellitus with diabetic peripheral angiopathy without gangrene: Secondary | ICD-10-CM | POA: Diagnosis not present

## 2023-06-05 DIAGNOSIS — R1312 Dysphagia, oropharyngeal phase: Secondary | ICD-10-CM | POA: Diagnosis not present

## 2023-06-05 DIAGNOSIS — K7689 Other specified diseases of liver: Secondary | ICD-10-CM | POA: Diagnosis not present

## 2023-06-05 DIAGNOSIS — I110 Hypertensive heart disease with heart failure: Secondary | ICD-10-CM | POA: Diagnosis not present

## 2023-06-05 DIAGNOSIS — I700 Atherosclerosis of aorta: Secondary | ICD-10-CM | POA: Diagnosis not present

## 2023-06-05 DIAGNOSIS — E1151 Type 2 diabetes mellitus with diabetic peripheral angiopathy without gangrene: Secondary | ICD-10-CM | POA: Diagnosis not present

## 2023-06-05 DIAGNOSIS — I5042 Chronic combined systolic (congestive) and diastolic (congestive) heart failure: Secondary | ICD-10-CM | POA: Diagnosis not present

## 2023-06-07 DIAGNOSIS — I110 Hypertensive heart disease with heart failure: Secondary | ICD-10-CM | POA: Diagnosis not present

## 2023-06-07 DIAGNOSIS — R1312 Dysphagia, oropharyngeal phase: Secondary | ICD-10-CM | POA: Diagnosis not present

## 2023-06-07 DIAGNOSIS — K7689 Other specified diseases of liver: Secondary | ICD-10-CM | POA: Diagnosis not present

## 2023-06-07 DIAGNOSIS — E1151 Type 2 diabetes mellitus with diabetic peripheral angiopathy without gangrene: Secondary | ICD-10-CM | POA: Diagnosis not present

## 2023-06-07 DIAGNOSIS — I5042 Chronic combined systolic (congestive) and diastolic (congestive) heart failure: Secondary | ICD-10-CM | POA: Diagnosis not present

## 2023-06-07 DIAGNOSIS — I700 Atherosclerosis of aorta: Secondary | ICD-10-CM | POA: Diagnosis not present

## 2023-06-13 DIAGNOSIS — I110 Hypertensive heart disease with heart failure: Secondary | ICD-10-CM | POA: Diagnosis not present

## 2023-06-13 DIAGNOSIS — I700 Atherosclerosis of aorta: Secondary | ICD-10-CM | POA: Diagnosis not present

## 2023-06-13 DIAGNOSIS — R1312 Dysphagia, oropharyngeal phase: Secondary | ICD-10-CM | POA: Diagnosis not present

## 2023-06-13 DIAGNOSIS — K7689 Other specified diseases of liver: Secondary | ICD-10-CM | POA: Diagnosis not present

## 2023-06-13 DIAGNOSIS — I5042 Chronic combined systolic (congestive) and diastolic (congestive) heart failure: Secondary | ICD-10-CM | POA: Diagnosis not present

## 2023-06-13 DIAGNOSIS — E1151 Type 2 diabetes mellitus with diabetic peripheral angiopathy without gangrene: Secondary | ICD-10-CM | POA: Diagnosis not present

## 2023-06-14 DIAGNOSIS — R338 Other retention of urine: Secondary | ICD-10-CM | POA: Diagnosis not present

## 2023-06-16 DIAGNOSIS — D649 Anemia, unspecified: Secondary | ICD-10-CM | POA: Diagnosis not present

## 2023-06-16 DIAGNOSIS — E038 Other specified hypothyroidism: Secondary | ICD-10-CM | POA: Diagnosis not present

## 2023-06-16 DIAGNOSIS — R634 Abnormal weight loss: Secondary | ICD-10-CM | POA: Diagnosis not present

## 2023-06-16 DIAGNOSIS — E1151 Type 2 diabetes mellitus with diabetic peripheral angiopathy without gangrene: Secondary | ICD-10-CM | POA: Diagnosis not present

## 2023-06-16 DIAGNOSIS — R6510 Systemic inflammatory response syndrome (SIRS) of non-infectious origin without acute organ dysfunction: Secondary | ICD-10-CM | POA: Diagnosis not present

## 2023-06-16 DIAGNOSIS — Z87310 Personal history of (healed) osteoporosis fracture: Secondary | ICD-10-CM | POA: Diagnosis not present

## 2023-06-16 DIAGNOSIS — Z8673 Personal history of transient ischemic attack (TIA), and cerebral infarction without residual deficits: Secondary | ICD-10-CM | POA: Diagnosis not present

## 2023-06-16 DIAGNOSIS — I700 Atherosclerosis of aorta: Secondary | ICD-10-CM | POA: Diagnosis not present

## 2023-06-16 DIAGNOSIS — I5042 Chronic combined systolic (congestive) and diastolic (congestive) heart failure: Secondary | ICD-10-CM | POA: Diagnosis not present

## 2023-06-16 DIAGNOSIS — K7689 Other specified diseases of liver: Secondary | ICD-10-CM | POA: Diagnosis not present

## 2023-06-16 DIAGNOSIS — Z8616 Personal history of COVID-19: Secondary | ICD-10-CM | POA: Diagnosis not present

## 2023-06-16 DIAGNOSIS — Z7982 Long term (current) use of aspirin: Secondary | ICD-10-CM | POA: Diagnosis not present

## 2023-06-16 DIAGNOSIS — Z7983 Long term (current) use of bisphosphonates: Secondary | ICD-10-CM | POA: Diagnosis not present

## 2023-06-16 DIAGNOSIS — Z681 Body mass index (BMI) 19 or less, adult: Secondary | ICD-10-CM | POA: Diagnosis not present

## 2023-06-16 DIAGNOSIS — E871 Hypo-osmolality and hyponatremia: Secondary | ICD-10-CM | POA: Diagnosis not present

## 2023-06-16 DIAGNOSIS — R1312 Dysphagia, oropharyngeal phase: Secondary | ICD-10-CM | POA: Diagnosis not present

## 2023-06-16 DIAGNOSIS — I2584 Coronary atherosclerosis due to calcified coronary lesion: Secondary | ICD-10-CM | POA: Diagnosis not present

## 2023-06-16 DIAGNOSIS — I110 Hypertensive heart disease with heart failure: Secondary | ICD-10-CM | POA: Diagnosis not present

## 2023-06-16 DIAGNOSIS — E441 Mild protein-calorie malnutrition: Secondary | ICD-10-CM | POA: Diagnosis not present

## 2023-06-16 DIAGNOSIS — I340 Nonrheumatic mitral (valve) insufficiency: Secondary | ICD-10-CM | POA: Diagnosis not present

## 2023-06-16 DIAGNOSIS — I2510 Atherosclerotic heart disease of native coronary artery without angina pectoris: Secondary | ICD-10-CM | POA: Diagnosis not present

## 2023-06-16 DIAGNOSIS — I351 Nonrheumatic aortic (valve) insufficiency: Secondary | ICD-10-CM | POA: Diagnosis not present

## 2023-06-16 DIAGNOSIS — M810 Age-related osteoporosis without current pathological fracture: Secondary | ICD-10-CM | POA: Diagnosis not present

## 2023-06-19 DIAGNOSIS — K7689 Other specified diseases of liver: Secondary | ICD-10-CM | POA: Diagnosis not present

## 2023-06-19 DIAGNOSIS — I110 Hypertensive heart disease with heart failure: Secondary | ICD-10-CM | POA: Diagnosis not present

## 2023-06-19 DIAGNOSIS — I5042 Chronic combined systolic (congestive) and diastolic (congestive) heart failure: Secondary | ICD-10-CM | POA: Diagnosis not present

## 2023-06-19 DIAGNOSIS — I700 Atherosclerosis of aorta: Secondary | ICD-10-CM | POA: Diagnosis not present

## 2023-06-19 DIAGNOSIS — R1312 Dysphagia, oropharyngeal phase: Secondary | ICD-10-CM | POA: Diagnosis not present

## 2023-06-19 DIAGNOSIS — E1151 Type 2 diabetes mellitus with diabetic peripheral angiopathy without gangrene: Secondary | ICD-10-CM | POA: Diagnosis not present

## 2023-06-29 DIAGNOSIS — I110 Hypertensive heart disease with heart failure: Secondary | ICD-10-CM | POA: Diagnosis not present

## 2023-06-29 DIAGNOSIS — K7689 Other specified diseases of liver: Secondary | ICD-10-CM | POA: Diagnosis not present

## 2023-06-29 DIAGNOSIS — E1151 Type 2 diabetes mellitus with diabetic peripheral angiopathy without gangrene: Secondary | ICD-10-CM | POA: Diagnosis not present

## 2023-06-29 DIAGNOSIS — I5042 Chronic combined systolic (congestive) and diastolic (congestive) heart failure: Secondary | ICD-10-CM | POA: Diagnosis not present

## 2023-06-29 DIAGNOSIS — R1312 Dysphagia, oropharyngeal phase: Secondary | ICD-10-CM | POA: Diagnosis not present

## 2023-06-29 DIAGNOSIS — I700 Atherosclerosis of aorta: Secondary | ICD-10-CM | POA: Diagnosis not present

## 2023-07-07 DIAGNOSIS — K7689 Other specified diseases of liver: Secondary | ICD-10-CM | POA: Diagnosis not present

## 2023-07-07 DIAGNOSIS — I700 Atherosclerosis of aorta: Secondary | ICD-10-CM | POA: Diagnosis not present

## 2023-07-07 DIAGNOSIS — I110 Hypertensive heart disease with heart failure: Secondary | ICD-10-CM | POA: Diagnosis not present

## 2023-07-07 DIAGNOSIS — E1151 Type 2 diabetes mellitus with diabetic peripheral angiopathy without gangrene: Secondary | ICD-10-CM | POA: Diagnosis not present

## 2023-07-07 DIAGNOSIS — I5042 Chronic combined systolic (congestive) and diastolic (congestive) heart failure: Secondary | ICD-10-CM | POA: Diagnosis not present

## 2023-07-07 DIAGNOSIS — R1312 Dysphagia, oropharyngeal phase: Secondary | ICD-10-CM | POA: Diagnosis not present

## 2023-07-12 DIAGNOSIS — I700 Atherosclerosis of aorta: Secondary | ICD-10-CM | POA: Diagnosis not present

## 2023-07-12 DIAGNOSIS — R1312 Dysphagia, oropharyngeal phase: Secondary | ICD-10-CM | POA: Diagnosis not present

## 2023-07-12 DIAGNOSIS — K7689 Other specified diseases of liver: Secondary | ICD-10-CM | POA: Diagnosis not present

## 2023-07-12 DIAGNOSIS — E1151 Type 2 diabetes mellitus with diabetic peripheral angiopathy without gangrene: Secondary | ICD-10-CM | POA: Diagnosis not present

## 2023-07-12 DIAGNOSIS — I5042 Chronic combined systolic (congestive) and diastolic (congestive) heart failure: Secondary | ICD-10-CM | POA: Diagnosis not present

## 2023-07-12 DIAGNOSIS — I110 Hypertensive heart disease with heart failure: Secondary | ICD-10-CM | POA: Diagnosis not present

## 2023-07-17 ENCOUNTER — Encounter: Payer: Self-pay | Admitting: Cardiology

## 2023-07-17 DIAGNOSIS — R338 Other retention of urine: Secondary | ICD-10-CM | POA: Diagnosis not present

## 2023-07-19 ENCOUNTER — Encounter: Payer: Self-pay | Admitting: Cardiology

## 2023-07-19 ENCOUNTER — Ambulatory Visit: Payer: Medicare Other | Attending: Cardiology | Admitting: Cardiology

## 2023-07-19 VITALS — BP 136/82 | HR 64 | Ht 69.0 in | Wt 110.8 lb

## 2023-07-19 DIAGNOSIS — I10 Essential (primary) hypertension: Secondary | ICD-10-CM | POA: Insufficient documentation

## 2023-07-19 DIAGNOSIS — I420 Dilated cardiomyopathy: Secondary | ICD-10-CM | POA: Insufficient documentation

## 2023-07-19 DIAGNOSIS — I471 Supraventricular tachycardia, unspecified: Secondary | ICD-10-CM | POA: Diagnosis not present

## 2023-07-19 DIAGNOSIS — E119 Type 2 diabetes mellitus without complications: Secondary | ICD-10-CM | POA: Diagnosis not present

## 2023-07-19 DIAGNOSIS — R002 Palpitations: Secondary | ICD-10-CM | POA: Diagnosis not present

## 2023-07-19 MED ORDER — PANTOPRAZOLE SODIUM 40 MG PO TBEC: 40.0000 mg | DELAYED_RELEASE_TABLET | Freq: Every day | ORAL | 1 refills | Status: DC

## 2023-07-19 NOTE — Progress Notes (Signed)
Cardiology Office Note:    Date:  07/19/2023   ID:  Raymond Fowler, DOB Aug 03, 1932, MRN [id number]  PCP:  Joycelyn Rua, MD  Cardiologist:  [name], MD   Referring MD: Joycelyn Rua, MD    ASSESSMENT:    1. SVT (supraventricular tachycardia) (HCC)   2. Palpitations   3. Dilated cardiomyopathy (HCC)   4. Essential hypertension   5. Diabetes mellitus without complication (HCC)    PLAN:    In order of problems listed above:  Primary prevention stressed with the patient.  Importance of compliance with diet medication stressed and patient verbalized standing. History of congestive heart failure: Cardiomyopathy: I discussed echo report with the patient at extensive length and questions were answered to satisfaction.  He is tolerating guideline directed medical therapy well.  Lab work from the past was reviewed. Essential hypertension: Blood pressure stable and diet was emphasized.  Lifestyle modification urged. Amiodarone therapy: Reviewed liver function test done a couple of months ago and they are fine.  He is on a small dose of amiodarone.  Tolerating it well. Mixed dyslipidemia: On statin therapy.  Diet emphasized.  This is followed by primary care. Patient will be seen in follow-up appointment in 9 months or earlier if the patient has any concerns.   Medication Adjustments/Labs and Tests Ordered: Current medicines are reviewed at length with the patient today.  Concerns regarding medicines are outlined above.  Orders Placed This Encounter  Procedures   EKG 12-Lead   Meds ordered this encounter  Medications   pantoprazole (PROTONIX) 40 MG tablet    Sig: Take 1 tablet (40 mg total) by mouth daily.    Dispense:  30 tablet    Refill:  1     No chief complaint on file.    History of Present Illness:    Raymond Fowler is a 89 y.o. male.  Patient has past medical history of essential hypertension, dyslipidemia, cardiomyopathy and denies any problems at this time  and takes care of activities of daily living.  No chest pain orthopnea or PND.  Family member accompanies him for the visit.  Patient has been very cautious about salt intake and weight checks on a daily basis.  Congestive heart failure education was given.  Past Medical History:  Diagnosis Date   Atypical chest pain 05/12/2023   Bilateral hearing loss 07/29/2015   Bilateral impacted cerumen 07/29/2015   BPH (benign prostatic hyperplasia) 07/12/2021   Cardiac murmur 07/09/2021   Cerebrovascular disease 11/04/2016   Chronic combined systolic and diastolic congestive heart failure (HCC) 05/12/2023   Cochlear implant in place 02/24/2020   COVID-19 virus infection 07/12/2021   Diabetes mellitus without complication (HCC)    Diet-controlled diabetes mellitus (HCC) 07/09/2021   Dilated cardiomyopathy (HCC) 07/14/2021   Dizziness and giddiness 11/04/2016   Essential hypertension 07/06/2021   Frailty 07/06/2021   Hardening of the aorta (main artery of the heart) (HCC) 07/06/2021   History of fall 05/12/2023   Hypertension    Hypomagnesemia 07/12/2021   Hyponatremia 07/12/2021   Hypophosphatemia 07/13/2021   Mild protein malnutrition (HCC) 05/12/2023   Normocytic anemia 07/12/2021   Odynophagia 05/12/2023   Osteoporosis 07/06/2021   Palpitations 07/09/2021   Pathological fracture of vertebra 07/06/2021   Peripheral vascular disorder due to diabetes mellitus (HCC) 08/23/2021   Protein calorie malnutrition (HCC) 07/06/2021   Protein-calorie malnutrition, severe 07/12/2021   Pure hypercholesterolemia 07/06/2021   Sensorineural hearing loss (SNHL) of both ears 02/27/2018   SIRS (systemic  inflammatory response syndrome) (HCC) 05/11/2023   Subclinical hypothyroidism 07/13/2021   Supraventricular tachycardia (HCC) 07/09/2021   SVT (supraventricular tachycardia) (HCC) 07/11/2021   Thrombocytopenia (HCC) 07/12/2021   Type 2 diabetes mellitus with other circulatory complications (HCC)  07/06/2021   Unintentional weight loss 05/12/2023   Wears hearing aid in right ear 03/10/2020    Past Surgical History:  Procedure Laterality Date   APPENDECTOMY     CHOLECYSTECTOMY     COCHLEAR IMPLANT     EYE SURGERY     FRACTURE SURGERY     HERNIA REPAIR     TONSILLECTOMY      Current Medications: Current Meds  Medication Sig   alendronate (FOSAMAX) 70 MG tablet Take 70 mg by mouth once a week.   amiodarone (PACERONE) 100 MG tablet Take 1 tablet (100 mg total) by mouth daily.   aspirin EC 325 MG tablet Take 325 mg by mouth daily.   atorvastatin (LIPITOR) 10 MG tablet Take 1 tablet (10 mg total) by mouth every evening.   brimonidine (ALPHAGAN) 0.2 % ophthalmic solution Place 1 drop into both eyes 2 (two) times daily.   carboxymethylcellul-glycerin (REFRESH OPTIVE) 0.5-0.9 % ophthalmic solution Place 1 drop into both eyes 2 (two) times daily.   finasteride (PROSCAR) 5 MG tablet Take 5 mg by mouth daily.   GEMTESA 75 MG TABS Take 75 mg by mouth daily.   levothyroxine (SYNTHROID) 50 MCG tablet Take 50 mcg by mouth daily before breakfast.   liothyronine (CYTOMEL) 25 MCG tablet Take 25 mcg by mouth daily.   metoprolol succinate (TOPROL-XL) 25 MG 24 hr tablet Take 0.5 tablets (12.5 mg total) by mouth daily. Take with or immediately following a meal.   Multiple Vitamins-Minerals (MULTIVITAMIN WITH MINERALS) tablet Take 1 tablet by mouth daily.   Omega-3 Fatty Acids (FISH OIL) 1200 MG CAPS Take 1,200 mg by mouth daily.   polyethylene glycol (MIRALAX) 17 g packet Take 17 g by mouth daily.   RESTASIS 0.05 % ophthalmic emulsion Place 1 drop into both eyes 2 (two) times daily.   sacubitril-valsartan (ENTRESTO) 24-26 MG Take 1 tablet by mouth 2 (two) times daily.     Allergies:   Patient has no known allergies.   Social History   Socioeconomic History   Marital status: Married    Spouse name: Alvira Philips   Number of children: 2   Years of education: Not on file   Highest education  level: Not on file  Occupational History   Occupation: Retired  Tobacco Use   Smoking status: Never   Smokeless tobacco: Never  Vaping Use   Vaping status: Never Used  Substance and Sexual Activity   Alcohol use: Yes    Comment: rare   Drug use: No   Sexual activity: Not Currently  Other Topics Concern   Not on file  Social History Narrative   Lives with wife   Caffeine use: 4 cups daily   Right handed   Social Drivers of Health   Financial Resource Strain: Not on file  Food Insecurity: No Food Insecurity (05/12/2023)   Hunger Vital Sign    Worried About Running Out of Food in the Last Year: Never true    Ran Out of Food in the Last Year: Never true  Transportation Needs: No Transportation Needs (05/12/2023)   PRAPARE - Administrator, Civil Service (Medical): No    Lack of Transportation (Non-Medical): No  Physical Activity: Not on file  Stress: Not on file  Social Connections: Not on file     Family History: The patient's family history includes Diabetes in his sister.  ROS:   Please see the history of present illness.    All other systems reviewed and are negative.  EKGs/Labs/Other Studies Reviewed:    The following studies were reviewed today: EKG reveals sinus rhythm left axis deviation left ventricular hypertrophy and nonspecific ST-T changes   Recent Labs: 01/06/2023: TSH 2.140 05/13/2023: ALT 15 05/14/2023: BUN 18; Creatinine, Ser 0.59; Hemoglobin 12.0; Magnesium 1.9; Platelets 221; Potassium 3.6; Sodium 130  Recent Lipid Panel    Component Value Date/Time   CHOL 146 02/02/2022 0919   TRIG 52 02/02/2022 0919   HDL 78 02/02/2022 0919   CHOLHDL 1.9 02/02/2022 0919   CHOLHDL 2.1 07/12/2021 0240   VLDL 8 07/12/2021 0240   LDLCALC 57 02/02/2022 0919    Physical Exam:    VS:  BP 136/82   Pulse 64   Ht 5\' 9"  (1.753 m)   Wt 110 lb 12.8 oz (50.3 kg)   SpO2 99%   BMI 16.36 kg/m     Wt Readings from Last 3 Encounters:  07/19/23 110 lb  12.8 oz (50.3 kg)  05/11/23 110 lb 0.2 oz (49.9 kg)  01/07/23 110 lb 0.2 oz (49.9 kg)     GEN: Patient is in no acute distress HEENT: Normal NECK: No JVD; No carotid bruits LYMPHATICS: No lymphadenopathy CARDIAC: Hear sounds regular, 2/6 systolic murmur at the apex. RESPIRATORY:  Clear to auscultation without rales, wheezing or rhonchi  ABDOMEN: Soft, non-tender, non-distended MUSCULOSKELETAL:  No edema; No deformity  SKIN: Warm and dry NEUROLOGIC:  Alert and oriented x 3 PSYCHIATRIC:  Normal affect   Signed, [name], MD  07/19/2023 9:13 AM    [facility]

## 2023-07-19 NOTE — Patient Instructions (Signed)
 Medication Instructions:  Your physician recommends that you continue on your current medications as directed. Please refer to the Current Medication list given to you today.  *If you need a refill on your cardiac medications before your next appointment, please call your pharmacy*   Lab Work: None Ordered If you have labs (blood work) drawn today and your tests are completely normal, you will receive your results only by: MyChart Message (if you have MyChart) OR A paper copy in the mail If you have any lab test that is abnormal or we need to change your treatment, we will call you to review the results.   Testing/Procedures: None Ordered   Follow-Up: At [facility], you and your health needs are our priority.  As part of our continuing mission to provide you with exceptional heart care, we have created designated Provider Care Teams.  These Care Teams include your primary Cardiologist (physician) and Advanced Practice Providers (APPs -  Physician Assistants and Nurse Practitioners) who all work together to provide you with the care you need, when you need it.  We recommend signing up for the patient portal called "MyChart".  Sign up information is provided on this After Visit Summary.  MyChart is used to connect with patients for Virtual Visits (Telemedicine).  Patients are able to view lab/test results, encounter notes, upcoming appointments, etc.  Non-urgent messages can be sent to your provider as well.   To learn more about what you can do with MyChart, go to [email or web address].    Your next appointment:   9 month follow up

## 2023-08-01 DIAGNOSIS — E46 Unspecified protein-calorie malnutrition: Secondary | ICD-10-CM | POA: Diagnosis not present

## 2023-08-01 DIAGNOSIS — E1159 Type 2 diabetes mellitus with other circulatory complications: Secondary | ICD-10-CM | POA: Diagnosis not present

## 2023-08-01 DIAGNOSIS — I10 Essential (primary) hypertension: Secondary | ICD-10-CM | POA: Diagnosis not present

## 2023-08-01 DIAGNOSIS — E7800 Pure hypercholesterolemia, unspecified: Secondary | ICD-10-CM | POA: Diagnosis not present

## 2023-08-01 DIAGNOSIS — M810 Age-related osteoporosis without current pathological fracture: Secondary | ICD-10-CM | POA: Diagnosis not present

## 2023-08-01 DIAGNOSIS — E039 Hypothyroidism, unspecified: Secondary | ICD-10-CM | POA: Diagnosis not present

## 2023-08-06 NOTE — Progress Notes (Unsigned)
 [facility] Initial Consultation   Referring Provider Joycelyn Rua, MD [address] [place],  [place] [address]  Primary Care Provider Joycelyn Rua, MD  Patient Profile: Raymond Fowler is a 89 y.o. male who is seen in consultation in the [facility] at the request of Dr. Lenise Arena for evaluation and management of the problem(s) noted below.  Problem List: Oropharyngeal dysphagia Weight loss   History of Present Illness   Raymond Fowler is a 89 y.o. male with a history of bilateral hearing loss,cochlear implant, BPH, chronic indwelling Foley catheter, chronic combined systolic and diastolic heart failure, SVT, hypertension, T2DM, PVD, protein calorie malnutrition, subclinical hypothyroidism, thrombocytopenia who presents with symptoms of dysphagia and weight loss.  Raymond Fowler is accompanied to the gastroenterology office today by his wife who provides history in conjunction with him.  Raymond Fowler was hospitalized at [facility] 05/11/2023 - 05/16/2023 with SIRS, hyponatremia, dysphagia and SVT. -- Seen as GI consult by Dr. Bosie Clos during hospitalization -- Reported choking on pork prior to hospitalization and 40 pound weight loss x 2 years -- CT chest, abdomen and pelvis overall unrevealing -- Barium esophagram: No esophageal stricture or mass, patent GE junction -- SLP eval -mild oropharyngeal dysphagia -advised mechanical soft diet and nectar thick liquids  In speaking with Raymond Fowler and his wife today they report the following history: -- Prior to recent hospitalization he was not having any chronic issues with dysphagia -- Wife notes that the incident prompting hospitalization involve the ingestion of a large piece of pork without appropriate chewing -- He immediately noted a sense of food getting stuck but was ultimately able to pass the food bolus down -regurgitation -- He had not had any issues of food impaction or dysphagia prior to recent  hospitalization -- Wife notes that he is only able to chew with the front of his mouth as he does not have molars intact -- Denies any pain or discomfort in his oropharynx or neck with swallowing -- Has been able to eat and drink appropriately since hospital discharge without residual dysphagia  -- In terms of weight loss, his wife reports that this has been a slow, progressive and chronic issue -- Appetite waxes and wanes but has not had a dramatic decrease in oral intake -- Denies abdominal pain or early satiety with eating -- No nausea or vomiting -- Stools daily or every other day with the use of a digestive supplement that he takes at home-wife is not sure what the supplement contains -- Previous weight was 110 pounds and is 112 pounds today  -- No family history of gastrointestinal malignancies or significant gastrointestinal disorders  GI Review of Symptoms Significant for None. Otherwise negative.  General Review of Systems  Review of systems is significant for the pertinent positives and negatives as listed per the HPI.  Full ROS is otherwise negative.  Past Medical History   Past Medical History:  Diagnosis Date   Atypical chest pain 05/12/2023   Bilateral hearing loss 07/29/2015   Bilateral impacted cerumen 07/29/2015   BPH (benign prostatic hyperplasia) 07/12/2021   Cardiac murmur 07/09/2021   Cerebrovascular disease 11/04/2016   Chronic combined systolic and diastolic congestive heart failure (HCC) 05/12/2023   Cochlear implant in place 02/24/2020   COPD (chronic obstructive pulmonary disease) (HCC)    COVID-19 virus infection 07/12/2021   Diabetes mellitus without complication (HCC)    Diet-controlled diabetes mellitus (HCC) 07/09/2021   Dilated cardiomyopathy (HCC) 07/14/2021   Dizziness and giddiness  11/04/2016   Essential hypertension 07/06/2021   Frailty 07/06/2021   Gallstones    Glaucoma    Hardening of the aorta (main artery of the heart) (HCC)  07/06/2021   History of fall 05/12/2023   Hypertension    Hypomagnesemia 07/12/2021   Hyponatremia 07/12/2021   Hypophosphatemia 07/13/2021   Mild protein malnutrition (HCC) 05/12/2023   Mini stroke    Normocytic anemia 07/12/2021   Odynophagia 05/12/2023   Osteoporosis 07/06/2021   Palpitations 07/09/2021   Pathological fracture of vertebra 07/06/2021   Peripheral vascular disorder due to diabetes mellitus (HCC) 08/23/2021   Protein calorie malnutrition (HCC) 07/06/2021   Protein-calorie malnutrition, severe 07/12/2021   Pure hypercholesterolemia 07/06/2021   Sensorineural hearing loss (SNHL) of both ears 02/27/2018   SIRS (systemic inflammatory response syndrome) (HCC) 05/11/2023   Subclinical hypothyroidism 07/13/2021   Supraventricular tachycardia (HCC) 07/09/2021   SVT (supraventricular tachycardia) (HCC) 07/11/2021   Thrombocytopenia (HCC) 07/12/2021   Type 2 diabetes mellitus with other circulatory complications (HCC) 07/06/2021   Unintentional weight loss 05/12/2023   Wears hearing aid in right ear 03/10/2020     Past Surgical History   Past Surgical History:  Procedure Laterality Date   APPENDECTOMY     CATARACT EXTRACTION W/ INTRAOCULAR LENS  IMPLANT, BILATERAL Bilateral    CHOLECYSTECTOMY     COCHLEAR IMPLANT     FINGER SURGERY Right 1958   index finger   FRACTURE SURGERY Left 1950   arm   INGUINAL HERNIA REPAIR Bilateral    12/06/05, 09/11/06   TONSILLECTOMY     VASECTOMY  1971     Allergies and Medications  No Known Allergies    Current Meds  Medication Sig   alendronate (FOSAMAX) 70 MG tablet Take 70 mg by mouth once a week.   amiodarone (PACERONE) 100 MG tablet Take 1 tablet (100 mg total) by mouth daily.   aspirin EC 325 MG tablet Take 325 mg by mouth daily.   atorvastatin (LIPITOR) 10 MG tablet Take 1 tablet (10 mg total) by mouth every evening.   brimonidine (ALPHAGAN) 0.2 % ophthalmic solution Place 1 drop into both eyes 2 (two) times daily.    carboxymethylcellul-glycerin (REFRESH OPTIVE) 0.5-0.9 % ophthalmic solution Place 1 drop into both eyes 2 (two) times daily.   finasteride (PROSCAR) 5 MG tablet Take 5 mg by mouth daily.   GEMTESA 75 MG TABS Take 75 mg by mouth daily.   levothyroxine (SYNTHROID) 50 MCG tablet Take 50 mcg by mouth daily before breakfast.   liothyronine (CYTOMEL) 25 MCG tablet Take 25 mcg by mouth daily.   metoprolol succinate (TOPROL-XL) 25 MG 24 hr tablet Take 0.5 tablets (12.5 mg total) by mouth daily. Take with or immediately following a meal.   Multiple Vitamins-Minerals (MULTIVITAMIN WITH MINERALS) tablet Take 1 tablet by mouth daily.   Omega-3 Fatty Acids (FISH OIL) 1200 MG CAPS Take 1,200 mg by mouth daily.   pantoprazole (PROTONIX) 40 MG tablet Take 1 tablet (40 mg total) by mouth daily.   polyethylene glycol (MIRALAX) 17 g packet Take 17 g by mouth daily.   RESTASIS 0.05 % ophthalmic emulsion Place 1 drop into both eyes 2 (two) times daily.   sacubitril-valsartan (ENTRESTO) 24-26 MG Take 1 tablet by mouth 2 (two) times daily.     Family History   Family History  Problem Relation Age of Onset   Heart disease Mother    Heart disease Father    Diabetes Sister    Other Daughter  lymph node cancer     Social History   Social History   Tobacco Use   Smoking status: Never   Smokeless tobacco: Never  Vaping Use   Vaping status: Never Used  Substance Use Topics   Alcohol use: Not Currently    Comment: rare   Drug use: No   Con reports that he has never smoked. He has never used smokeless tobacco. He reports that he does not currently use alcohol. He reports that he does not use drugs.  Vital Signs and Physical Examination   Vitals:   08/07/23 1358  BP: (!) 144/80  Pulse: 72   Body mass index is 17.89 kg/m. Weight: 112 lb 8 oz (51 kg)  General: Thin, elderly gentleman in no acute distress Head: Normocephalic and atraumatic, temporal wasting present Eyes: Sclerae  anicteric, EOMI Ears: Wears hearing aids Lungs: Clear throughout to auscultation Heart: Regular rate and rhythm; No murmurs, rubs or bruits Abdomen: Soft, non tender and non distended. No masses, hepatosplenomegaly or hernias noted. Normal Bowel sounds Rectal: Deferred Musculoskeletal: Thin without contractures  Review of Data  The following data was reviewed at the time of this encounter:  Laboratory Studies      Latest Ref Rng & Units 05/14/2023    6:45 AM 05/13/2023    7:47 AM 05/12/2023    5:57 AM  CBC  WBC 4.0 - 10.5 K/uL 7.8  11.3  20.1   Hemoglobin 13.0 - 17.0 g/dL 91.4  78.2  95.6   Hematocrit 39.0 - 52.0 % 36.1  35.6  38.5   Platelets 150 - 400 K/uL 221  206  220     Lab Results  Component Value Date   LIPASE 40 07/05/2021      Latest Ref Rng & Units 05/14/2023    6:45 AM 05/13/2023    7:47 AM 05/12/2023    5:57 AM  CMP  Glucose 70 - 99 mg/dL 213  086  578   BUN 8 - 23 mg/dL 18  23  19    Creatinine 0.61 - 1.24 mg/dL 4.69  6.29  5.28   Sodium 135 - 145 mmol/L 130  133  133   Potassium 3.5 - 5.1 mmol/L 3.6  4.1  3.8   Chloride 98 - 111 mmol/L 99  103  100   CO2 22 - 32 mmol/L 25  24  24    Calcium 8.9 - 10.3 mg/dL 8.1  8.1  8.3   Total Protein 6.5 - 8.1 g/dL  5.9  6.2   Total Bilirubin <1.2 mg/dL  0.7  1.0   Alkaline Phos 38 - 126 U/L  39  37   AST 15 - 41 U/L  17  19   ALT 0 - 44 U/L  15  15      Imaging Studies  Barium esophagram 04/2023 1. No esophageal stricture or mass. 2. Patent GE junction. 3. limited exam due to patient immobility.  CTAP 04/2023 1. No clear evidence of infection. Bibasilar atelectasis and small effusions greater on the LEFT. 2. Calcific sludge within the urinary bladder. Foley catheter within the bladder. No renal infection identified.   CT Chest 04/2023 1. New trace left pleural effusion. 2. Stable chronic compression deformities of C7, T2, T4, T5, T6 and T7. 3. Stable 2.1 cm left thyroid nodule. No follow-up necessary  given stability. (Ref: J Am Coll Radiol. 2015 Feb;12(2): 143-50). 4. Aortic atherosclerosis.   GI Procedures and Studies  SLP Eval 04/2023 Clinical Impression: Patient  presents with mild oropharyngeal dysphagia. Dysphagia characterized by impaired pharyngeal motility, tongue base retraction, laryngeal closure resulting in pharyngeal retention and mild amount of penetration of thin and nectar thick liquids. Trace aspiration of thin, nectar mixed with secretions noted just below the vocal cord. Patient benefits from following solid with liquid to help transit vallecular retention into esophagus. He does not sense retention in pharynx. Barium tablet given with nectar thick liquid did not transit into esophagus and was noted on anterior posterior view to be in the right vallecular space. Multiple compensation strategies attempted including providing patient with pudding, nectar thick and thin barium as well as water and soda with various postures including chin tuck head turn right head turn left not being effective. Again patient is not sensate to barium tablet halting thus recommend if not contraindicated patient be given medicine crushed if it is any larger than 13 mm in size. Upon esophageal sweep patient appears with some liquid retention distally, MBS does not diagnose below UES. Recommend patient continue dysphagia 3 diet with nectar thick liquids but allow thin water between meals freely and follow-up with speech pathologist at next venue of care to strengthen his swallow.   Clinical Impression  It is my clinical impression that Raymond Fowler is a 89 y.o. male with;  Oropharyngeal dysphagia Weight loss  Raymond Fowler presents to the office today status post hospitalization in December 2024 for dysphagia and food impaction after consuming a large piece of pork.  He and his wife deny that he has had any issues with either solid or liquid dysphagia before or after his hospitalization.  His wife indicates  that the incident that prompted hospitalization seem to involve Raymond Fowler taking a large bite of meat and not chewing adequately.  It is noted that he can only chew with his front teeth as he does not have molars.  He underwent extensive evaluation in the hospital including CT scans of the chest and abdomen, barium esophagram and SLP eval.  His chest and abdominal imaging did not reveal any significant abnormalities related to swallowing or his GI tract.  There is no evidence of stricture or mass.  SLP evaluation suggested mild oropharyngeal dysphagia and it was advised that he consume a soft mechanical diet and consume nectar thick liquids.  He has been doing well since hospital discharge without any recurrent issues.  Discussed that I would recommend conservative monitoring at this time.  There are no abnormalities at present that would warrant an upper endoscopy exam.  I suspect his weight loss is multifactorial and in some ways related to the aging process.  He underwent extensive evaluation in the hospital and no abnormalities were identified on imaging to suggest malignancy or other concerning etiologies.  He has gained 2 pounds since hospital discharge.  Plan  Continue dysphagia diet with soft mechanical foods and nectar thick liquids Advised to chew food thoroughly before swallowing No indication for EGD at this time.  Advised his wife to monitor his swallowing and if alarm features evolve we can reevaluate the need for EGD. As above, weight loss is likely multifactorial.  No additional evaluation was recommended in the office today from a GI perspective. Family can continue home digestive supplement that ameliorates constipation.  Planned Follow Up PRN  The patient or caregiver verbalized understanding of the material covered, with no barriers to understanding. All questions were answered. Patient or caregiver is agreeable with the plan outlined above.    It was a pleasure to  see Raymond Fowler.  If  you have any questions or concerns regarding this evaluation, do not hesitate to contact me.  Maren Beach, MD [facility]

## 2023-08-07 ENCOUNTER — Ambulatory Visit: Payer: Medicare Other | Admitting: Pediatrics

## 2023-08-07 ENCOUNTER — Encounter: Payer: Self-pay | Admitting: Pediatrics

## 2023-08-07 VITALS — BP 144/80 | HR 72 | Ht 66.5 in | Wt 112.5 lb

## 2023-08-07 DIAGNOSIS — R1312 Dysphagia, oropharyngeal phase: Secondary | ICD-10-CM | POA: Diagnosis not present

## 2023-08-07 DIAGNOSIS — R634 Abnormal weight loss: Secondary | ICD-10-CM | POA: Diagnosis not present

## 2023-08-07 NOTE — Patient Instructions (Signed)
 Follow up as needed.  Thank you for entrusting me with your care and for choosing [facility], Dr. Maren Beach  _______________________________________________________  If your blood pressure at your visit was 140/90 or greater, please contact your primary care physician to follow up on this.  _______________________________________________________  If you are age 88 or older, your body mass index should be between 23-30. Your Body mass index is 17.89 kg/m. If this is out of the aforementioned range listed, please consider follow up with your Primary Care Provider.  If you are age 30 or younger, your body mass index should be between 19-25. Your Body mass index is 17.89 kg/m. If this is out of the aformentioned range listed, please consider follow up with your Primary Care Provider.   ________________________________________________________  The [facility] providers would like to encourage you to use [facility] to communicate with providers for non-urgent requests or questions.  Due to long hold times on the telephone, sending your provider a message by [facility] may be a faster and more efficient way to get a response.  Please allow 48 business hours for a response.  Please remember that this is for non-urgent requests.  _______________________________________________________

## 2023-08-11 ENCOUNTER — Other Ambulatory Visit: Payer: Self-pay | Admitting: Cardiology

## 2023-08-14 DIAGNOSIS — R338 Other retention of urine: Secondary | ICD-10-CM | POA: Diagnosis not present

## 2023-08-23 DIAGNOSIS — I420 Dilated cardiomyopathy: Secondary | ICD-10-CM | POA: Diagnosis not present

## 2023-08-23 DIAGNOSIS — I700 Atherosclerosis of aorta: Secondary | ICD-10-CM | POA: Diagnosis not present

## 2023-08-23 DIAGNOSIS — I2510 Atherosclerotic heart disease of native coronary artery without angina pectoris: Secondary | ICD-10-CM | POA: Diagnosis not present

## 2023-08-23 DIAGNOSIS — I5042 Chronic combined systolic (congestive) and diastolic (congestive) heart failure: Secondary | ICD-10-CM | POA: Diagnosis not present

## 2023-08-28 DIAGNOSIS — M810 Age-related osteoporosis without current pathological fracture: Secondary | ICD-10-CM | POA: Diagnosis not present

## 2023-08-28 DIAGNOSIS — E1151 Type 2 diabetes mellitus with diabetic peripheral angiopathy without gangrene: Secondary | ICD-10-CM | POA: Diagnosis not present

## 2023-08-28 DIAGNOSIS — E039 Hypothyroidism, unspecified: Secondary | ICD-10-CM | POA: Diagnosis not present

## 2023-08-28 DIAGNOSIS — E1159 Type 2 diabetes mellitus with other circulatory complications: Secondary | ICD-10-CM | POA: Diagnosis not present

## 2023-09-14 DIAGNOSIS — R338 Other retention of urine: Secondary | ICD-10-CM | POA: Diagnosis not present

## 2023-09-21 DIAGNOSIS — I700 Atherosclerosis of aorta: Secondary | ICD-10-CM | POA: Diagnosis not present

## 2023-09-21 DIAGNOSIS — I2510 Atherosclerotic heart disease of native coronary artery without angina pectoris: Secondary | ICD-10-CM | POA: Diagnosis not present

## 2023-09-21 DIAGNOSIS — I5042 Chronic combined systolic (congestive) and diastolic (congestive) heart failure: Secondary | ICD-10-CM | POA: Diagnosis not present

## 2023-09-21 DIAGNOSIS — I420 Dilated cardiomyopathy: Secondary | ICD-10-CM | POA: Diagnosis not present

## 2023-09-27 DIAGNOSIS — E1159 Type 2 diabetes mellitus with other circulatory complications: Secondary | ICD-10-CM | POA: Diagnosis not present

## 2023-09-27 DIAGNOSIS — I700 Atherosclerosis of aorta: Secondary | ICD-10-CM | POA: Diagnosis not present

## 2023-09-27 DIAGNOSIS — I2510 Atherosclerotic heart disease of native coronary artery without angina pectoris: Secondary | ICD-10-CM | POA: Diagnosis not present

## 2023-09-27 DIAGNOSIS — E039 Hypothyroidism, unspecified: Secondary | ICD-10-CM | POA: Diagnosis not present

## 2023-09-27 DIAGNOSIS — I420 Dilated cardiomyopathy: Secondary | ICD-10-CM | POA: Diagnosis not present

## 2023-09-27 DIAGNOSIS — I5042 Chronic combined systolic (congestive) and diastolic (congestive) heart failure: Secondary | ICD-10-CM | POA: Diagnosis not present

## 2023-09-27 DIAGNOSIS — M810 Age-related osteoporosis without current pathological fracture: Secondary | ICD-10-CM | POA: Diagnosis not present

## 2023-09-27 DIAGNOSIS — E1151 Type 2 diabetes mellitus with diabetic peripheral angiopathy without gangrene: Secondary | ICD-10-CM | POA: Diagnosis not present

## 2023-10-16 DIAGNOSIS — R338 Other retention of urine: Secondary | ICD-10-CM | POA: Diagnosis not present

## 2023-10-21 DIAGNOSIS — I420 Dilated cardiomyopathy: Secondary | ICD-10-CM | POA: Diagnosis not present

## 2023-10-21 DIAGNOSIS — I700 Atherosclerosis of aorta: Secondary | ICD-10-CM | POA: Diagnosis not present

## 2023-10-21 DIAGNOSIS — I2510 Atherosclerotic heart disease of native coronary artery without angina pectoris: Secondary | ICD-10-CM | POA: Diagnosis not present

## 2023-10-21 DIAGNOSIS — I5042 Chronic combined systolic (congestive) and diastolic (congestive) heart failure: Secondary | ICD-10-CM | POA: Diagnosis not present

## 2023-10-27 DIAGNOSIS — H40023 Open angle with borderline findings, high risk, bilateral: Secondary | ICD-10-CM | POA: Diagnosis not present

## 2023-10-27 DIAGNOSIS — H04123 Dry eye syndrome of bilateral lacrimal glands: Secondary | ICD-10-CM | POA: Diagnosis not present

## 2023-10-28 DIAGNOSIS — I700 Atherosclerosis of aorta: Secondary | ICD-10-CM | POA: Diagnosis not present

## 2023-10-28 DIAGNOSIS — M810 Age-related osteoporosis without current pathological fracture: Secondary | ICD-10-CM | POA: Diagnosis not present

## 2023-10-28 DIAGNOSIS — E039 Hypothyroidism, unspecified: Secondary | ICD-10-CM | POA: Diagnosis not present

## 2023-10-28 DIAGNOSIS — I5042 Chronic combined systolic (congestive) and diastolic (congestive) heart failure: Secondary | ICD-10-CM | POA: Diagnosis not present

## 2023-10-28 DIAGNOSIS — I2510 Atherosclerotic heart disease of native coronary artery without angina pectoris: Secondary | ICD-10-CM | POA: Diagnosis not present

## 2023-10-28 DIAGNOSIS — I420 Dilated cardiomyopathy: Secondary | ICD-10-CM | POA: Diagnosis not present

## 2023-10-28 DIAGNOSIS — E1151 Type 2 diabetes mellitus with diabetic peripheral angiopathy without gangrene: Secondary | ICD-10-CM | POA: Diagnosis not present

## 2023-10-28 DIAGNOSIS — E1159 Type 2 diabetes mellitus with other circulatory complications: Secondary | ICD-10-CM | POA: Diagnosis not present

## 2023-11-03 ENCOUNTER — Other Ambulatory Visit: Payer: Self-pay | Admitting: Cardiology

## 2023-11-16 DIAGNOSIS — R338 Other retention of urine: Secondary | ICD-10-CM | POA: Diagnosis not present

## 2023-11-20 ENCOUNTER — Encounter: Payer: Self-pay | Admitting: Podiatry

## 2023-11-20 ENCOUNTER — Ambulatory Visit (INDEPENDENT_AMBULATORY_CARE_PROVIDER_SITE_OTHER): Admitting: Podiatry

## 2023-11-20 VITALS — Ht 66.5 in | Wt 112.0 lb

## 2023-11-20 DIAGNOSIS — M79674 Pain in right toe(s): Secondary | ICD-10-CM

## 2023-11-20 DIAGNOSIS — M79675 Pain in left toe(s): Secondary | ICD-10-CM | POA: Diagnosis not present

## 2023-11-20 DIAGNOSIS — B351 Tinea unguium: Secondary | ICD-10-CM

## 2023-11-20 DIAGNOSIS — I420 Dilated cardiomyopathy: Secondary | ICD-10-CM | POA: Diagnosis not present

## 2023-11-20 DIAGNOSIS — I2510 Atherosclerotic heart disease of native coronary artery without angina pectoris: Secondary | ICD-10-CM | POA: Diagnosis not present

## 2023-11-20 DIAGNOSIS — I5042 Chronic combined systolic (congestive) and diastolic (congestive) heart failure: Secondary | ICD-10-CM | POA: Diagnosis not present

## 2023-11-20 DIAGNOSIS — I700 Atherosclerosis of aorta: Secondary | ICD-10-CM | POA: Diagnosis not present

## 2023-11-21 NOTE — Progress Notes (Signed)
 Subjective:   Patient ID: Raymond Fowler, male   DOB: 89 y.o.   MRN: [id number]   HPI Patient presents with elongated nailbeds 1-5 both feet and presents with caregiver.  States they cannot cut them himself and the caregiver also cannot cut them due to thickness.  Patient does not smoke and is not active   Review of Systems  All other systems reviewed and are negative.       Objective:  Physical Exam Vitals and nursing note reviewed.  Constitutional:      Appearance: He is well-developed.  Pulmonary:     Effort: Pulmonary effort is normal.   Musculoskeletal:        General: Normal range of motion.   Skin:    General: Skin is warm.   Neurological:     Mental Status: He is alert.     Vascular status moderately diminished both DP and PT pulses with patient noted to have neurologic sensation moderately diminished and muscle strength diminished.  Range of motion adequate patient is noted to have thick yellow brittle nailbeds 1-5 both feet that are painful when pressed making shoe gear difficult and he cannot cut himself     Assessment:  Chronic mycotic nail infection with pain 1-5 both feet     Plan:  Debridement of painful nailbeds 1-5 both feet no iatrogenic bleeding noted

## 2023-12-15 DIAGNOSIS — N319 Neuromuscular dysfunction of bladder, unspecified: Secondary | ICD-10-CM | POA: Diagnosis not present

## 2023-12-15 DIAGNOSIS — R338 Other retention of urine: Secondary | ICD-10-CM | POA: Diagnosis not present

## 2023-12-20 DIAGNOSIS — I700 Atherosclerosis of aorta: Secondary | ICD-10-CM | POA: Diagnosis not present

## 2023-12-20 DIAGNOSIS — I420 Dilated cardiomyopathy: Secondary | ICD-10-CM | POA: Diagnosis not present

## 2023-12-20 DIAGNOSIS — I5042 Chronic combined systolic (congestive) and diastolic (congestive) heart failure: Secondary | ICD-10-CM | POA: Diagnosis not present

## 2023-12-20 DIAGNOSIS — I2510 Atherosclerotic heart disease of native coronary artery without angina pectoris: Secondary | ICD-10-CM | POA: Diagnosis not present

## 2023-12-21 ENCOUNTER — Other Ambulatory Visit: Payer: Self-pay | Admitting: Cardiology

## 2023-12-28 DIAGNOSIS — I5042 Chronic combined systolic (congestive) and diastolic (congestive) heart failure: Secondary | ICD-10-CM | POA: Diagnosis not present

## 2023-12-28 DIAGNOSIS — I420 Dilated cardiomyopathy: Secondary | ICD-10-CM | POA: Diagnosis not present

## 2023-12-28 DIAGNOSIS — M810 Age-related osteoporosis without current pathological fracture: Secondary | ICD-10-CM | POA: Diagnosis not present

## 2023-12-28 DIAGNOSIS — E039 Hypothyroidism, unspecified: Secondary | ICD-10-CM | POA: Diagnosis not present

## 2023-12-28 DIAGNOSIS — I700 Atherosclerosis of aorta: Secondary | ICD-10-CM | POA: Diagnosis not present

## 2023-12-28 DIAGNOSIS — E1151 Type 2 diabetes mellitus with diabetic peripheral angiopathy without gangrene: Secondary | ICD-10-CM | POA: Diagnosis not present

## 2023-12-28 DIAGNOSIS — E1159 Type 2 diabetes mellitus with other circulatory complications: Secondary | ICD-10-CM | POA: Diagnosis not present

## 2023-12-28 DIAGNOSIS — I2510 Atherosclerotic heart disease of native coronary artery without angina pectoris: Secondary | ICD-10-CM | POA: Diagnosis not present

## 2023-12-29 DIAGNOSIS — H6123 Impacted cerumen, bilateral: Secondary | ICD-10-CM | POA: Diagnosis not present

## 2024-01-19 DIAGNOSIS — I420 Dilated cardiomyopathy: Secondary | ICD-10-CM | POA: Diagnosis not present

## 2024-01-19 DIAGNOSIS — I5042 Chronic combined systolic (congestive) and diastolic (congestive) heart failure: Secondary | ICD-10-CM | POA: Diagnosis not present

## 2024-01-19 DIAGNOSIS — I700 Atherosclerosis of aorta: Secondary | ICD-10-CM | POA: Diagnosis not present

## 2024-01-19 DIAGNOSIS — I2510 Atherosclerotic heart disease of native coronary artery without angina pectoris: Secondary | ICD-10-CM | POA: Diagnosis not present

## 2024-01-19 DIAGNOSIS — R338 Other retention of urine: Secondary | ICD-10-CM | POA: Diagnosis not present

## 2024-01-24 DIAGNOSIS — E1159 Type 2 diabetes mellitus with other circulatory complications: Secondary | ICD-10-CM | POA: Diagnosis not present

## 2024-01-24 DIAGNOSIS — I10 Essential (primary) hypertension: Secondary | ICD-10-CM | POA: Diagnosis not present

## 2024-01-24 DIAGNOSIS — Z23 Encounter for immunization: Secondary | ICD-10-CM | POA: Diagnosis not present

## 2024-01-24 DIAGNOSIS — E039 Hypothyroidism, unspecified: Secondary | ICD-10-CM | POA: Diagnosis not present

## 2024-01-24 DIAGNOSIS — E7800 Pure hypercholesterolemia, unspecified: Secondary | ICD-10-CM | POA: Diagnosis not present

## 2024-01-24 DIAGNOSIS — M810 Age-related osteoporosis without current pathological fracture: Secondary | ICD-10-CM | POA: Diagnosis not present

## 2024-01-24 DIAGNOSIS — E46 Unspecified protein-calorie malnutrition: Secondary | ICD-10-CM | POA: Diagnosis not present

## 2024-01-28 DIAGNOSIS — E039 Hypothyroidism, unspecified: Secondary | ICD-10-CM | POA: Diagnosis not present

## 2024-01-28 DIAGNOSIS — E1151 Type 2 diabetes mellitus with diabetic peripheral angiopathy without gangrene: Secondary | ICD-10-CM | POA: Diagnosis not present

## 2024-01-28 DIAGNOSIS — I700 Atherosclerosis of aorta: Secondary | ICD-10-CM | POA: Diagnosis not present

## 2024-01-28 DIAGNOSIS — I5042 Chronic combined systolic (congestive) and diastolic (congestive) heart failure: Secondary | ICD-10-CM | POA: Diagnosis not present

## 2024-01-28 DIAGNOSIS — I2510 Atherosclerotic heart disease of native coronary artery without angina pectoris: Secondary | ICD-10-CM | POA: Diagnosis not present

## 2024-01-28 DIAGNOSIS — M810 Age-related osteoporosis without current pathological fracture: Secondary | ICD-10-CM | POA: Diagnosis not present

## 2024-01-28 DIAGNOSIS — I420 Dilated cardiomyopathy: Secondary | ICD-10-CM | POA: Diagnosis not present

## 2024-01-28 DIAGNOSIS — E1159 Type 2 diabetes mellitus with other circulatory complications: Secondary | ICD-10-CM | POA: Diagnosis not present

## 2024-02-18 DIAGNOSIS — I2510 Atherosclerotic heart disease of native coronary artery without angina pectoris: Secondary | ICD-10-CM | POA: Diagnosis not present

## 2024-02-18 DIAGNOSIS — I700 Atherosclerosis of aorta: Secondary | ICD-10-CM | POA: Diagnosis not present

## 2024-02-18 DIAGNOSIS — I5042 Chronic combined systolic (congestive) and diastolic (congestive) heart failure: Secondary | ICD-10-CM | POA: Diagnosis not present

## 2024-02-18 DIAGNOSIS — I420 Dilated cardiomyopathy: Secondary | ICD-10-CM | POA: Diagnosis not present

## 2024-02-21 DIAGNOSIS — R338 Other retention of urine: Secondary | ICD-10-CM | POA: Diagnosis not present

## 2024-02-27 DIAGNOSIS — E1159 Type 2 diabetes mellitus with other circulatory complications: Secondary | ICD-10-CM | POA: Diagnosis not present

## 2024-02-27 DIAGNOSIS — I2510 Atherosclerotic heart disease of native coronary artery without angina pectoris: Secondary | ICD-10-CM | POA: Diagnosis not present

## 2024-02-27 DIAGNOSIS — I5042 Chronic combined systolic (congestive) and diastolic (congestive) heart failure: Secondary | ICD-10-CM | POA: Diagnosis not present

## 2024-02-27 DIAGNOSIS — E1151 Type 2 diabetes mellitus with diabetic peripheral angiopathy without gangrene: Secondary | ICD-10-CM | POA: Diagnosis not present

## 2024-02-27 DIAGNOSIS — M810 Age-related osteoporosis without current pathological fracture: Secondary | ICD-10-CM | POA: Diagnosis not present

## 2024-02-27 DIAGNOSIS — E039 Hypothyroidism, unspecified: Secondary | ICD-10-CM | POA: Diagnosis not present

## 2024-02-27 DIAGNOSIS — I700 Atherosclerosis of aorta: Secondary | ICD-10-CM | POA: Diagnosis not present

## 2024-02-27 DIAGNOSIS — I420 Dilated cardiomyopathy: Secondary | ICD-10-CM | POA: Diagnosis not present

## 2024-03-08 DIAGNOSIS — Z23 Encounter for immunization: Secondary | ICD-10-CM | POA: Diagnosis not present

## 2024-03-19 DIAGNOSIS — I700 Atherosclerosis of aorta: Secondary | ICD-10-CM | POA: Diagnosis not present

## 2024-03-19 DIAGNOSIS — I2510 Atherosclerotic heart disease of native coronary artery without angina pectoris: Secondary | ICD-10-CM | POA: Diagnosis not present

## 2024-03-19 DIAGNOSIS — I420 Dilated cardiomyopathy: Secondary | ICD-10-CM | POA: Diagnosis not present

## 2024-03-19 DIAGNOSIS — I5042 Chronic combined systolic (congestive) and diastolic (congestive) heart failure: Secondary | ICD-10-CM | POA: Diagnosis not present

## 2024-03-22 ENCOUNTER — Telehealth: Payer: Self-pay | Admitting: Cardiology

## 2024-03-22 DIAGNOSIS — R338 Other retention of urine: Secondary | ICD-10-CM | POA: Diagnosis not present

## 2024-03-22 MED ORDER — SACUBITRIL-VALSARTAN 24-26 MG PO TABS: 1.0000 | ORAL_TABLET | Freq: Two times a day (BID) | ORAL | 1 refills | Status: AC

## 2024-03-22 NOTE — Telephone Encounter (Signed)
*  STAT* If patient is at the pharmacy, call can be transferred to refill team.   1. Which medications need to be refilled? (please list name of each medication and dose if known) sacubitril -valsartan  (ENTRESTO ) 24-26 MG   2. Which pharmacy/location (including street and city if local pharmacy) is medication to be sent to? [facility] - [place], [place] - [address]   3. Do they need a 30 day or 90 day supply? 90

## 2024-03-29 DIAGNOSIS — I5042 Chronic combined systolic (congestive) and diastolic (congestive) heart failure: Secondary | ICD-10-CM | POA: Diagnosis not present

## 2024-03-29 DIAGNOSIS — M810 Age-related osteoporosis without current pathological fracture: Secondary | ICD-10-CM | POA: Diagnosis not present

## 2024-03-29 DIAGNOSIS — I700 Atherosclerosis of aorta: Secondary | ICD-10-CM | POA: Diagnosis not present

## 2024-03-29 DIAGNOSIS — E1151 Type 2 diabetes mellitus with diabetic peripheral angiopathy without gangrene: Secondary | ICD-10-CM | POA: Diagnosis not present

## 2024-03-29 DIAGNOSIS — E1159 Type 2 diabetes mellitus with other circulatory complications: Secondary | ICD-10-CM | POA: Diagnosis not present

## 2024-03-29 DIAGNOSIS — E039 Hypothyroidism, unspecified: Secondary | ICD-10-CM | POA: Diagnosis not present

## 2024-03-29 DIAGNOSIS — I2510 Atherosclerotic heart disease of native coronary artery without angina pectoris: Secondary | ICD-10-CM | POA: Diagnosis not present

## 2024-03-29 DIAGNOSIS — I420 Dilated cardiomyopathy: Secondary | ICD-10-CM | POA: Diagnosis not present

## 2024-04-04 DIAGNOSIS — I10 Essential (primary) hypertension: Secondary | ICD-10-CM | POA: Diagnosis not present

## 2024-04-04 DIAGNOSIS — Z1331 Encounter for screening for depression: Secondary | ICD-10-CM | POA: Diagnosis not present

## 2024-04-04 DIAGNOSIS — E039 Hypothyroidism, unspecified: Secondary | ICD-10-CM | POA: Diagnosis not present

## 2024-04-04 DIAGNOSIS — Z9181 History of falling: Secondary | ICD-10-CM | POA: Diagnosis not present

## 2024-04-04 DIAGNOSIS — Z0000 Encounter for general adult medical examination without abnormal findings: Secondary | ICD-10-CM | POA: Diagnosis not present

## 2024-04-04 DIAGNOSIS — E46 Unspecified protein-calorie malnutrition: Secondary | ICD-10-CM | POA: Diagnosis not present

## 2024-04-10 DIAGNOSIS — R338 Other retention of urine: Secondary | ICD-10-CM | POA: Diagnosis not present

## 2024-04-17 DIAGNOSIS — N401 Enlarged prostate with lower urinary tract symptoms: Secondary | ICD-10-CM | POA: Diagnosis not present

## 2024-04-17 DIAGNOSIS — R338 Other retention of urine: Secondary | ICD-10-CM | POA: Diagnosis not present

## 2024-04-18 DIAGNOSIS — I420 Dilated cardiomyopathy: Secondary | ICD-10-CM | POA: Diagnosis not present

## 2024-04-18 DIAGNOSIS — I5042 Chronic combined systolic (congestive) and diastolic (congestive) heart failure: Secondary | ICD-10-CM | POA: Diagnosis not present

## 2024-04-18 DIAGNOSIS — I700 Atherosclerosis of aorta: Secondary | ICD-10-CM | POA: Diagnosis not present

## 2024-04-18 DIAGNOSIS — I2510 Atherosclerotic heart disease of native coronary artery without angina pectoris: Secondary | ICD-10-CM | POA: Diagnosis not present

## 2024-04-28 DIAGNOSIS — E1159 Type 2 diabetes mellitus with other circulatory complications: Secondary | ICD-10-CM | POA: Diagnosis not present

## 2024-04-28 DIAGNOSIS — I2510 Atherosclerotic heart disease of native coronary artery without angina pectoris: Secondary | ICD-10-CM | POA: Diagnosis not present

## 2024-04-28 DIAGNOSIS — E039 Hypothyroidism, unspecified: Secondary | ICD-10-CM | POA: Diagnosis not present

## 2024-04-28 DIAGNOSIS — E1151 Type 2 diabetes mellitus with diabetic peripheral angiopathy without gangrene: Secondary | ICD-10-CM | POA: Diagnosis not present

## 2024-04-28 DIAGNOSIS — I420 Dilated cardiomyopathy: Secondary | ICD-10-CM | POA: Diagnosis not present

## 2024-04-28 DIAGNOSIS — M810 Age-related osteoporosis without current pathological fracture: Secondary | ICD-10-CM | POA: Diagnosis not present

## 2024-04-28 DIAGNOSIS — I5042 Chronic combined systolic (congestive) and diastolic (congestive) heart failure: Secondary | ICD-10-CM | POA: Diagnosis not present

## 2024-04-28 DIAGNOSIS — I700 Atherosclerosis of aorta: Secondary | ICD-10-CM | POA: Diagnosis not present

## 2024-05-01 DIAGNOSIS — H40023 Open angle with borderline findings, high risk, bilateral: Secondary | ICD-10-CM | POA: Diagnosis not present

## 2024-05-01 DIAGNOSIS — D3131 Benign neoplasm of right choroid: Secondary | ICD-10-CM | POA: Diagnosis not present

## 2024-05-01 DIAGNOSIS — H43813 Vitreous degeneration, bilateral: Secondary | ICD-10-CM | POA: Diagnosis not present

## 2024-06-04 ENCOUNTER — Emergency Department (HOSPITAL_COMMUNITY)

## 2024-06-04 ENCOUNTER — Inpatient Hospital Stay (HOSPITAL_COMMUNITY)
Admission: EM | Admit: 2024-06-04 | Discharge: 2024-06-07 | DRG: 698 | Disposition: A | Discharge status: Home / Self Care

## 2024-06-04 DIAGNOSIS — W1830XA Fall on same level, unspecified, initial encounter: Secondary | ICD-10-CM | POA: Diagnosis present

## 2024-06-04 DIAGNOSIS — Z7983 Long term (current) use of bisphosphonates: Secondary | ICD-10-CM

## 2024-06-04 DIAGNOSIS — I420 Dilated cardiomyopathy: Secondary | ICD-10-CM | POA: Diagnosis not present

## 2024-06-04 DIAGNOSIS — Z8249 Family history of ischemic heart disease and other diseases of the circulatory system: Secondary | ICD-10-CM

## 2024-06-04 DIAGNOSIS — N390 Urinary tract infection, site not specified: Principal | ICD-10-CM | POA: Diagnosis present

## 2024-06-04 DIAGNOSIS — R55 Syncope and collapse: Secondary | ICD-10-CM

## 2024-06-04 DIAGNOSIS — N3001 Acute cystitis with hematuria: Secondary | ICD-10-CM | POA: Diagnosis not present

## 2024-06-04 DIAGNOSIS — B9689 Other specified bacterial agents as the cause of diseases classified elsewhere: Secondary | ICD-10-CM | POA: Diagnosis present

## 2024-06-04 DIAGNOSIS — E43 Unspecified severe protein-calorie malnutrition: Secondary | ICD-10-CM | POA: Diagnosis present

## 2024-06-04 DIAGNOSIS — Z9621 Cochlear implant status: Secondary | ICD-10-CM | POA: Diagnosis present

## 2024-06-04 DIAGNOSIS — E1159 Type 2 diabetes mellitus with other circulatory complications: Secondary | ICD-10-CM | POA: Diagnosis present

## 2024-06-04 DIAGNOSIS — R011 Cardiac murmur, unspecified: Secondary | ICD-10-CM | POA: Diagnosis not present

## 2024-06-04 DIAGNOSIS — Z974 Presence of external hearing-aid: Secondary | ICD-10-CM | POA: Diagnosis not present

## 2024-06-04 DIAGNOSIS — S0101XA Laceration without foreign body of scalp, initial encounter: Secondary | ICD-10-CM | POA: Diagnosis present

## 2024-06-04 DIAGNOSIS — Z79899 Other long term (current) drug therapy: Secondary | ICD-10-CM

## 2024-06-04 DIAGNOSIS — Y92012 Bathroom of single-family (private) house as the place of occurrence of the external cause: Secondary | ICD-10-CM

## 2024-06-04 DIAGNOSIS — I471 Supraventricular tachycardia, unspecified: Secondary | ICD-10-CM | POA: Diagnosis present

## 2024-06-04 DIAGNOSIS — Z833 Family history of diabetes mellitus: Secondary | ICD-10-CM

## 2024-06-04 DIAGNOSIS — T83511A Infection and inflammatory reaction due to indwelling urethral catheter, initial encounter: Principal | ICD-10-CM | POA: Diagnosis present

## 2024-06-04 DIAGNOSIS — E1151 Type 2 diabetes mellitus with diabetic peripheral angiopathy without gangrene: Secondary | ICD-10-CM | POA: Diagnosis present

## 2024-06-04 DIAGNOSIS — N400 Enlarged prostate without lower urinary tract symptoms: Secondary | ICD-10-CM | POA: Diagnosis present

## 2024-06-04 DIAGNOSIS — Z9049 Acquired absence of other specified parts of digestive tract: Secondary | ICD-10-CM

## 2024-06-04 DIAGNOSIS — I110 Hypertensive heart disease with heart failure: Secondary | ICD-10-CM | POA: Diagnosis present

## 2024-06-04 DIAGNOSIS — R64 Cachexia: Secondary | ICD-10-CM | POA: Diagnosis present

## 2024-06-04 DIAGNOSIS — Z79890 Hormone replacement therapy (postmenopausal): Secondary | ICD-10-CM

## 2024-06-04 DIAGNOSIS — I5042 Chronic combined systolic (congestive) and diastolic (congestive) heart failure: Secondary | ICD-10-CM | POA: Diagnosis present

## 2024-06-04 DIAGNOSIS — Z8616 Personal history of COVID-19: Secondary | ICD-10-CM

## 2024-06-04 DIAGNOSIS — H903 Sensorineural hearing loss, bilateral: Secondary | ICD-10-CM | POA: Diagnosis present

## 2024-06-04 DIAGNOSIS — M810 Age-related osteoporosis without current pathological fracture: Secondary | ICD-10-CM | POA: Diagnosis present

## 2024-06-04 DIAGNOSIS — E038 Other specified hypothyroidism: Secondary | ICD-10-CM | POA: Diagnosis present

## 2024-06-04 DIAGNOSIS — H409 Unspecified glaucoma: Secondary | ICD-10-CM | POA: Diagnosis present

## 2024-06-04 DIAGNOSIS — F03911 Unspecified dementia, unspecified severity, with agitation: Secondary | ICD-10-CM | POA: Diagnosis present

## 2024-06-04 DIAGNOSIS — Z7982 Long term (current) use of aspirin: Secondary | ICD-10-CM

## 2024-06-04 DIAGNOSIS — J449 Chronic obstructive pulmonary disease, unspecified: Secondary | ICD-10-CM | POA: Diagnosis present

## 2024-06-04 DIAGNOSIS — E7800 Pure hypercholesterolemia, unspecified: Secondary | ICD-10-CM | POA: Diagnosis present

## 2024-06-04 HISTORY — DX: Syncope and collapse: R55

## 2024-06-04 HISTORY — DX: Urinary tract infection, site not specified: N39.0

## 2024-06-04 LAB — CBC WITH DIFFERENTIAL/PLATELET
Abs Immature Granulocytes: 0.11 K/uL — ABNORMAL HIGH (ref 0.00–0.07)
Basophils Absolute: 0 K/uL (ref 0.0–0.1)
Basophils Relative: 0 %
Eosinophils Absolute: 0 K/uL (ref 0.0–0.5)
Eosinophils Relative: 0 %
HCT: 45.2 % (ref 39.0–52.0)
Hemoglobin: 15 g/dL (ref 13.0–17.0)
Immature Granulocytes: 1 %
Lymphocytes Relative: 7 %
Lymphs Abs: 1 K/uL (ref 0.7–4.0)
MCH: 30.4 pg (ref 26.0–34.0)
MCHC: 33.2 g/dL (ref 30.0–36.0)
MCV: 91.5 fL (ref 80.0–100.0)
Monocytes Absolute: 0.9 K/uL (ref 0.1–1.0)
Monocytes Relative: 6 %
Neutro Abs: 13.7 K/uL — ABNORMAL HIGH (ref 1.7–7.7)
Neutrophils Relative %: 86 %
Platelets: 315 K/uL (ref 150–400)
RBC: 4.94 MIL/uL (ref 4.22–5.81)
RDW: 14.3 % (ref 11.5–15.5)
WBC: 15.8 K/uL — ABNORMAL HIGH (ref 4.0–10.5)
nRBC: 0 % (ref 0.0–0.2)

## 2024-06-04 LAB — URINALYSIS, ROUTINE W REFLEX MICROSCOPIC
Bilirubin Urine: NEGATIVE
Glucose, UA: NEGATIVE mg/dL
Hgb urine dipstick: NEGATIVE
Ketones, ur: NEGATIVE mg/dL
Nitrite: POSITIVE — AB
Protein, ur: NEGATIVE mg/dL
Specific Gravity, Urine: 1.01 (ref 1.005–1.030)
pH: 7 (ref 5.0–8.0)

## 2024-06-04 LAB — COMPREHENSIVE METABOLIC PANEL WITH GFR
ALT: 22 U/L (ref 0–44)
AST: 23 U/L (ref 15–41)
Albumin: 4.5 g/dL (ref 3.5–5.0)
Alkaline Phosphatase: 69 U/L (ref 38–126)
Anion gap: 12 (ref 5–15)
BUN: 18 mg/dL (ref 8–23)
CO2: 26 mmol/L (ref 22–32)
Calcium: 9.5 mg/dL (ref 8.9–10.3)
Chloride: 97 mmol/L — ABNORMAL LOW (ref 98–111)
Creatinine, Ser: 1.11 mg/dL (ref 0.61–1.24)
GFR, Estimated: >60 mL/min
Glucose, Bld: 166 mg/dL — ABNORMAL HIGH (ref 70–99)
Potassium: 4.6 mmol/L (ref 3.5–5.1)
Sodium: 135 mmol/L (ref 135–145)
Total Bilirubin: 0.5 mg/dL (ref 0.0–1.2)
Total Protein: 7.6 g/dL (ref 6.5–8.1)

## 2024-06-04 LAB — MAGNESIUM: Magnesium: 2 mg/dL (ref 1.7–2.4)

## 2024-06-04 LAB — TROPONIN T, HIGH SENSITIVITY
Troponin T High Sensitivity: 48 ng/L — ABNORMAL HIGH (ref 0–19)
Troponin T High Sensitivity: 48 ng/L — ABNORMAL HIGH (ref 0–19)
Troponin T High Sensitivity: 36 ng/L — ABNORMAL HIGH (ref 0–19)
Troponin T High Sensitivity: 42 ng/L — ABNORMAL HIGH (ref 0–19)

## 2024-06-04 LAB — TSH: TSH: 2.72 u[IU]/mL (ref 0.350–4.500)

## 2024-06-04 LAB — OSMOLALITY: Osmolality: 285 mosm/kg (ref 275–295)

## 2024-06-04 LAB — CK: Total CK: 94 U/L (ref 49–397)

## 2024-06-04 LAB — CBG MONITORING, ED: Glucose-Capillary: 148 mg/dL — ABNORMAL HIGH (ref 70–99)

## 2024-06-04 LAB — PHOSPHORUS: Phosphorus: 3.1 mg/dL (ref 2.5–4.6)

## 2024-06-04 LAB — T4, FREE: Free T4: 1.52 ng/dL (ref 0.80–2.00)

## 2024-06-04 LAB — LACTIC ACID, PLASMA
Lactic Acid, Venous: 1.2 mmol/L (ref 0.5–1.9)
Lactic Acid, Venous: 2 mmol/L (ref 0.5–1.9)

## 2024-06-04 MED ORDER — HYDROCODONE-ACETAMINOPHEN 5-325 MG PO TABS: 1.0000 | ORAL_TABLET | ORAL | Status: DC | PRN

## 2024-06-04 MED ORDER — AMIODARONE HCL 200 MG PO TABS
100.0000 mg | ORAL_TABLET | Freq: Every day | ORAL | Status: DC
  Administered 2024-06-05 – 2024-06-07 (×3): 100 mg via ORAL
  Filled 2024-06-04 (×4): qty 1

## 2024-06-04 MED ORDER — FINASTERIDE 5 MG PO TABS
5.0000 mg | ORAL_TABLET | Freq: Every day | ORAL | Status: DC
  Administered 2024-06-04 – 2024-06-07 (×4): 5 mg via ORAL
  Filled 2024-06-04 (×4): qty 1

## 2024-06-04 MED ORDER — ATORVASTATIN CALCIUM 10 MG PO TABS
10.0000 mg | ORAL_TABLET | Freq: Every evening | ORAL | Status: DC
  Administered 2024-06-04 – 2024-06-06 (×3): 10 mg via ORAL
  Filled 2024-06-04 (×3): qty 1

## 2024-06-04 MED ORDER — METOPROLOL SUCCINATE ER 25 MG PO TB24
12.5000 mg | ORAL_TABLET | Freq: Every day | ORAL | Status: DC
  Administered 2024-06-05 – 2024-06-07 (×3): 12.5 mg via ORAL
  Filled 2024-06-04 (×4): qty 1

## 2024-06-04 MED ORDER — ONDANSETRON HCL 4 MG PO TABS: 4.0000 mg | ORAL_TABLET | Freq: Four times a day (QID) | ORAL | Status: DC | PRN

## 2024-06-04 MED ORDER — SACUBITRIL-VALSARTAN 24-26 MG PO TABS
1.0000 | ORAL_TABLET | Freq: Two times a day (BID) | ORAL | Status: DC
  Administered 2024-06-04 – 2024-06-07 (×6): 1 via ORAL
  Filled 2024-06-04 (×6): qty 1

## 2024-06-04 MED ORDER — ONDANSETRON HCL 4 MG/2ML IJ SOLN: 4.0000 mg | Freq: Four times a day (QID) | INTRAMUSCULAR | Status: DC | PRN

## 2024-06-04 MED ORDER — ACETAMINOPHEN 325 MG PO TABS: 650.0000 mg | ORAL_TABLET | Freq: Four times a day (QID) | ORAL | Status: DC | PRN

## 2024-06-04 MED ORDER — LEVOTHYROXINE SODIUM 50 MCG PO TABS
25.0000 ug | ORAL_TABLET | Freq: Every day | ORAL | Status: DC
  Administered 2024-06-05 – 2024-06-07 (×3): 25 ug via ORAL
  Filled 2024-06-04 (×3): qty 1

## 2024-06-04 MED ORDER — SODIUM CHLORIDE 0.9 % IV SOLN
1.0000 g | INTRAVENOUS | Status: DC
  Administered 2024-06-04 – 2024-06-06 (×3): 1 g via INTRAVENOUS
  Filled 2024-06-04 (×3): qty 10

## 2024-06-04 MED ORDER — ACETAMINOPHEN 650 MG RE SUPP: 650.0000 mg | Freq: Four times a day (QID) | RECTAL | Status: DC | PRN

## 2024-06-04 MED ORDER — ASPIRIN 325 MG PO TBEC
325.0000 mg | DELAYED_RELEASE_TABLET | Freq: Every day | ORAL | Status: DC
  Administered 2024-06-05 – 2024-06-07 (×3): 325 mg via ORAL
  Filled 2024-06-04 (×4): qty 1

## 2024-06-04 MED ORDER — SODIUM CHLORIDE 0.9 % IV BOLUS: 500.0000 mL | Freq: Once | INTRAVENOUS | Status: AC

## 2024-06-04 MED ORDER — SODIUM CHLORIDE 0.9 % IV SOLN: INTRAVENOUS | Status: AC

## 2024-06-04 MED ORDER — LIOTHYRONINE SODIUM 25 MCG PO TABS
25.0000 ug | ORAL_TABLET | Freq: Every day | ORAL | Status: DC
  Administered 2024-06-05 – 2024-06-07 (×3): 25 ug via ORAL
  Filled 2024-06-04 (×3): qty 1

## 2024-06-04 MED ORDER — PANTOPRAZOLE SODIUM 40 MG PO TBEC
40.0000 mg | DELAYED_RELEASE_TABLET | Freq: Every day | ORAL | Status: DC
  Administered 2024-06-05 – 2024-06-07 (×3): 40 mg via ORAL
  Filled 2024-06-04 (×4): qty 1

## 2024-06-04 NOTE — ED Notes (Signed)
 Date and time results received: 06/04/2024 2310 (use smartphrase .now to insert current time)  Test: lactic  Critical Value: 2.0  Name of Provider Notified: Dr. Silvester

## 2024-06-04 NOTE — ED Notes (Signed)
Help get patient into a gown

## 2024-06-04 NOTE — Assessment & Plan Note (Signed)
 Syncope occurred post having a bowel movement Although family denies patient constipating or pushing Given slightly elevated troponins will order echogram Rehydrate Treat underlying UTI

## 2024-06-04 NOTE — Subjective & Objective (Signed)
 Patient was in his regular state of health he had his regular dose of MiraLAX  in the morning.  He has significant dementia but usually able to use the bathroom.  Wife was in the kitchen found him on the floor next to the toilet.  She helped him up and get him onto toilet he had a bowel movement and then slumped over.  She eased him onto the bathroom floor.  It was unclear if he ever completely lost consciousness or just gotten lightheaded. Once on the floor he had another bowel movement.  Bowel movements were formed soft no straining. Patient has not been complaining any chest pain or shortness of breath no recent fevers or chills No seizure-like activity no tongue biting Patient is on aspirin  no other blood thinners.  Denies hitting head. Per wife he have had a similar episode in the past

## 2024-06-04 NOTE — Assessment & Plan Note (Signed)
 Chronic stable

## 2024-06-04 NOTE — H&P (Signed)
 "    Raymond Fowler FMW:[id number] DOB: [birth year] DOA: 06/04/2024     PCP: Nanci Senior, MD   Outpatient Specialists:  CARDS: Dr. Jennifer JONELLE Crape, MD   Patient arrived to ER on 06/04/24 at 1335 Referred by Attending Silvester Ales, MD   Patient coming from:    home Lives With family     Chief Complaint:   Chief Complaint  Patient presents with   Fall    HPI: Raymond Fowler is a 89 y.o. male with medical history significant of Dm2, HOH status post cochlear implant, HTN, SVT, BPH, anemia, chronic combined systolic diastolic CHF, dementia, dysphagia, hypothyroidism, GERD, cerebrovascular disease  Presented with syncope  Patient was in his regular state of health he had his regular dose of MiraLAX  in the morning.  He has significant dementia but usually able to use the bathroom.  Wife was in the kitchen found him on the floor next to the toilet.  She helped him up and get him onto toilet he had a bowel movement and then slumped over.  She eased him onto the bathroom floor.  It was unclear if he ever completely lost consciousness or just gotten lightheaded. Once on the floor he had another bowel movement.  Bowel movements were formed soft no straining. Patient has not been complaining any chest pain or shortness of breath no recent fevers or chills No seizure-like activity no tongue biting Patient is on aspirin  no other blood thinners.  Denies hitting head. Per wife he have had a similar episode in the past    Patient has significant dementia and unable to provide his own history  Denies significant ETOH intake   Does not smoke     Regarding pertinent Chronic problems:    Hyperlipidemia -  on statins Lipitor (atorvastatin )  Lipid Panel     Component Value Date/Time   CHOL 146 02/02/2022 0919   TRIG 52 02/02/2022 0919   HDL 78 02/02/2022 0919   CHOLHDL 1.9 02/02/2022 0919   CHOLHDL 2.1 07/12/2021 0240   VLDL 8 07/12/2021 0240   LDLCALC 57 02/02/2022 0919   LABVLDL 11  02/02/2022 0919     HTN on Toprol    chronic CHF diastolic/systolic/ combined -  Supposed to be on Entresto  last echo  Recent Results (from the past 56199 hours)  ECHOCARDIOGRAM COMPLETE   Collection Time: 02/02/23 11:42 AM  Result Value   S' Lateral 3.50   Area-P 1/2 2.16   Ao pk vel 1.42   AR max vel 1.81   AV Peak grad 8.1   AV Area VTI 1.76   AV Area mean vel 1.78   P 1/2 time 571   AV Mean grad 4.0   MV M vel 4.52   MV Peak grad 81.7   Single Plane A2C EF 36.2   Single Plane A4C EF 39.9   Calc EF 37.7   AV Vena cont 0.30   Est EF 35 - 40%   Narrative      ECHOCARDIOGRAM REPORT      IMPRESSIONS    1. Left ventricular ejection fraction, by estimation, is 35 to 40%. Left ventricular ejection fraction by 3D volume is 36 %. Left ventricular ejection fraction by 2D MOD biplane is 37.7 %. The left ventricle has moderately decreased function. The left  ventricle demonstrates regional wall motion abnormalities (see scoring diagram/findings for description). Left ventricular diastolic parameters are consistent with Grade I diastolic dysfunction (impaired relaxation).  2. Right ventricular systolic function is normal.  The right ventricular size is normal. Tricuspid regurgitation signal is inadequate for assessing PA pressure.  3. The mitral valve is degenerative. Mild mitral valve regurgitation. No evidence of mitral stenosis.  4. The aortic valve is tricuspid. Aortic valve regurgitation is mild. Aortic valve sclerosis is present, with no evidence of aortic valve stenosis.  5. The inferior vena cava is normal in size with greater than 50% respiratory variability, suggesting right atrial pressure of 3 mmHg.         SVT on amiodarone  and metoprolol     DM 2 -  Lab Results  Component Value Date   HGBA1C 6.3 (H) 07/11/2021   diet controlled   Hypothyroidism:   Lab Results  Component Value Date   TSH 2.140 01/06/2023   T3TOTAL 92 07/13/2021   on synthroid       BPH - on   Proscar       Dementia -      While in ER: Clinical Course as of 06/04/24 1910  Tue Jun 04, 2024  1346 Attempted to call wife without answer. No voicemail option.  [RR]  1438 Spoke with wife now at bedside.  [RR]    Clinical Course User Index [RR] Bernis Ernst, PA-C     Wife now at bedside and provided more history Imaging nonacute troponin elevated in the 40s but stable UA worrisome for UTI patient unable to provide his own history whether he is having dysuria.    Lab Orders         Urine Culture         CBC with Differential         Comprehensive metabolic panel         Urinalysis, Routine w reflex microscopic -Urine, Clean Catch         CK     Plain films of pelvis nonacute CT HEAD/neck   NON acute . Limited evaluation of the brain due to artifact from left cochlear implant. No definite CT evidence for acute intracranial abnormality. Atrophy and chronic small vessel ischemic changes of the white matter. 2. Degenerative changes of the cervical spine. No acute osseous abnormality.    CXR -  NON acute    Following Medications were ordered in ER: Medications - No data to display  _______________     ED Triage Vitals [06/04/24 1535]  Encounter Vitals Group     BP (!) 182/97     Girls Systolic BP Percentile      Girls Diastolic BP Percentile      Boys Systolic BP Percentile      Boys Diastolic BP Percentile      Pulse Rate 68     Resp 16     Temp 98 F (36.7 C)     Temp Source Oral     SpO2 100 %     Weight      Height      Head Circumference      Peak Flow      Pain Score      Pain Loc      Pain Education      Exclude from Growth Chart   [id number]     _________________________________________ Significant initial  Findings: Abnormal Labs Reviewed  CBC WITH DIFFERENTIAL/PLATELET - Abnormal; Notable for the following components:      Result Value   WBC 15.8 (*)    Neutro Abs 13.7 (*)    Abs Immature Granulocytes 0.11 (*)    All other components  within  normal limits  COMPREHENSIVE METABOLIC PANEL WITH GFR - Abnormal; Notable for the following components:   Chloride 97 (*)    Glucose, Bld 166 (*)    All other components within normal limits  URINALYSIS, ROUTINE W REFLEX MICROSCOPIC - Abnormal; Notable for the following components:   APPearance CLOUDY (*)    Nitrite POSITIVE (*)    Leukocytes,Ua LARGE (*)    Bacteria, UA MANY (*)    All other components within normal limits  TROPONIN T, HIGH SENSITIVITY - Abnormal; Notable for the following components:   Troponin T High Sensitivity 48 (*)    All other components within normal limits  TROPONIN T, HIGH SENSITIVITY - Abnormal; Notable for the following components:   Troponin T High Sensitivity 48 (*)    All other components within normal limits      _________________________ Troponin  ordered 48- 48 Cardiac Panel (last 3 results) Recent Labs    06/04/24 1648  CKTOTAL 94     ECG: Ordered Personally reviewed and interpreted by me showing: HR : 72 Rhythm: Normal sinus rhythm Nonspecific ST and T wave abnormality Prolonged QT QTC 481  ing treated for an infection that is a known cause of these abnormalities     The recent clinical data is shown below. Vitals:   06/04/24 1535 06/04/24 1811  BP: (!) 182/97 (!) 186/95  Pulse: 68 81  Resp: 16 20  Temp: 98 F (36.7 C) 97.8 F (36.6 C)  TempSrc: Oral Oral  SpO2: 100% 100%    WBC     Component Value Date/Time   WBC 15.8 (H) 06/04/2024 1648   LYMPHSABS 1.0 06/04/2024 1648   MONOABS 0.9 06/04/2024 1648   EOSABS 0.0 06/04/2024 1648   BASOSABS 0.0 06/04/2024 1648    Lactic Acid, Venous    Component Value Date/Time   LATICACIDVEN 1.5 07/11/2021 1030      UA  evidence of UTI      Urine analysis:    Component Value Date/Time   COLORURINE YELLOW 06/04/2024 1827   APPEARANCEUR CLOUDY (A) 06/04/2024 1827   LABSPEC 1.010 06/04/2024 1827   PHURINE 7.0 06/04/2024 1827   GLUCOSEU NEGATIVE 06/04/2024 1827   HGBUR  NEGATIVE 06/04/2024 1827   BILIRUBINUR NEGATIVE 06/04/2024 1827   KETONESUR NEGATIVE 06/04/2024 1827   PROTEINUR NEGATIVE 06/04/2024 1827   NITRITE POSITIVE (A) 06/04/2024 1827   LEUKOCYTESUR LARGE (A) 06/04/2024 1827     __________________________________________________________ Recent Labs  Lab 06/04/24 1648  NA 135  K 4.6  CO2 26  GLUCOSE 166*  BUN 18  CREATININE 1.11  CALCIUM  9.5    Cr  Up from baseline see below Lab Results  Component Value Date   CREATININE 1.11 06/04/2024   CREATININE 0.59 (L) 05/14/2023   CREATININE 1.07 05/13/2023    Recent Labs  Lab 06/04/24 1648  AST 23  ALT 22  ALKPHOS 69  BILITOT 0.5  PROT 7.6  ALBUMIN 4.5   Lab Results  Component Value Date   CALCIUM  9.5 06/04/2024   PHOS 2.9 05/12/2023       Plt: Lab Results  Component Value Date   PLT 315 06/04/2024         Recent Labs  Lab 06/04/24 1648  WBC 15.8*  NEUTROABS 13.7*  HGB 15.0  HCT 45.2  MCV 91.5  PLT 315    HG/HCT   stable,     Component Value Date/Time   HGB 15.0 06/04/2024 1648   HGB 15.2 01/06/2023 1339  HCT 45.2 06/04/2024 1648   HCT 44.9 01/06/2023 1339   MCV 91.5 06/04/2024 1648   MCV 91 01/06/2023 1339     Hospitalist was called for admission for   Syncope,  The following Work up has been ordered so far:  Orders Placed This Encounter  Procedures   LACERATION REPAIR   Urine Culture   CT Head Wo Contrast   CT Cervical Spine Wo Contrast   DG Chest 1 View   DG Pelvis 1-2 Views   CBC with Differential   Comprehensive metabolic panel   Urinalysis, Routine w reflex microscopic -Urine, Clean Catch   CK   Cardiac Monitoring - Continuous Indefinite   Remove c-collar   Consult to hospitalist   ED EKG   Place in observation (patient's expected length of stay will be less than 2 midnights)     OTHER Significant initial  Findings:  labs showing:     DM  labs:  HbA1C: No results for input(s): HGBA1C in the last 8760 hours.     CBG (last  3)  No results for input(s): GLUCAP in the last 72 hours.        Cultures:    Component Value Date/Time   SDES  05/11/2023 1948    BLOOD LEFT HAND Performed at [facility], [address] [place], [place] [address]    SDES  05/11/2023 1948    BLOOD RIGHT FOREARM Performed at [facility], [address] [place], [place] [address]    [facility]  05/11/2023 1948    BOTTLES DRAWN AEROBIC AND ANAEROBIC Blood Culture adequate volume Performed at [facility], [address] [place], [place] [address]    [facility]  05/11/2023 1948    BOTTLES DRAWN AEROBIC AND ANAEROBIC Blood Culture results may not be optimal due to an inadequate volume of blood received in culture bottles Performed at [facility], [address], [place], [place] [address]    CULT  05/11/2023 1948    NO GROWTH 5 DAYS Performed at [facility], [address] [place], [place] [address]    CULT  05/11/2023 1948    NO GROWTH 5 DAYS Performed at [facility], [address], [place], [place] [address]    REPTSTATUS 05/16/2023 FINAL 05/11/2023 1948   REPTSTATUS 05/16/2023 FINAL 05/11/2023 1948     Radiological Exams on Admission: CT Head Wo Contrast Result Date: 06/04/2024 CLINICAL DATA:  Head and neck trauma EXAM: CT HEAD WITHOUT CONTRAST CT CERVICAL SPINE WITHOUT CONTRAST TECHNIQUE: Multidetector CT imaging of the head and cervical spine was performed following the standard protocol without intravenous contrast. Multiplanar CT image reconstructions of the cervical spine were also generated. RADIATION DOSE REDUCTION: This exam was performed according to the departmental dose-optimization program which includes automated exposure control, adjustment of the mA and/or kV according to patient size and/or use of iterative reconstruction technique. COMPARISON:  CT brain 03/16/2023, CT brain and cervical spine 10/02/2022 FINDINGS: CT HEAD FINDINGS Brain: Limited  by artifact from patient's left cochlear implant. No gross territorial infarction, hemorrhage or intracranial mass. Atrophy and advanced chronic small vessel ischemic changes of the white matter. Chronic left thalamic lacunar infarct. Stable ventricle size. Vascular: No hyperdense vessels. Vertebral and carotid vascular calcification Skull: No fracture.  Left cochlear implant Sinuses/Orbits: No acute finding. Other: None CT CERVICAL SPINE FINDINGS Alignment: No subluxation.  Facet alignment is normal. Skull base and vertebrae: Chronic superior endplate  deformity and mild compression C7, T1 and T2. No acute fracture is seen. Soft tissues and spinal canal: No prevertebral fluid or swelling. No visible canal hematoma. Disc levels: Advanced disc space narrowing C5-C6. Multilevel facet degenerative changes. No high-grade bony canal stenosis. Chronic fusion of left C2-C3 facets. Moderate severe C1-C2 degenerative change Upper chest: No acute finding Other: None IMPRESSION: 1. Limited evaluation of the brain due to artifact from left cochlear implant. No definite CT evidence for acute intracranial abnormality. Atrophy and chronic small vessel ischemic changes of the white matter. 2. Degenerative changes of the cervical spine. No acute osseous abnormality. Electronically Signed   By: Luke Bun M.D.   On: 06/04/2024 15:30   CT Cervical Spine Wo Contrast Result Date: 06/04/2024 CLINICAL DATA:  Head and neck trauma EXAM: CT HEAD WITHOUT CONTRAST CT CERVICAL SPINE WITHOUT CONTRAST TECHNIQUE: Multidetector CT imaging of the head and cervical spine was performed following the standard protocol without intravenous contrast. Multiplanar CT image reconstructions of the cervical spine were also generated. RADIATION DOSE REDUCTION: This exam was performed according to the departmental dose-optimization program which includes automated exposure control, adjustment of the mA and/or kV according to patient size and/or use of  iterative reconstruction technique. COMPARISON:  CT brain 03/16/2023, CT brain and cervical spine 10/02/2022 FINDINGS: CT HEAD FINDINGS Brain: Limited by artifact from patient's left cochlear implant. No gross territorial infarction, hemorrhage or intracranial mass. Atrophy and advanced chronic small vessel ischemic changes of the white matter. Chronic left thalamic lacunar infarct. Stable ventricle size. Vascular: No hyperdense vessels. Vertebral and carotid vascular calcification Skull: No fracture.  Left cochlear implant Sinuses/Orbits: No acute finding. Other: None CT CERVICAL SPINE FINDINGS Alignment: No subluxation.  Facet alignment is normal. Skull base and vertebrae: Chronic superior endplate deformity and mild compression C7, T1 and T2. No acute fracture is seen. Soft tissues and spinal canal: No prevertebral fluid or swelling. No visible canal hematoma. Disc levels: Advanced disc space narrowing C5-C6. Multilevel facet degenerative changes. No high-grade bony canal stenosis. Chronic fusion of left C2-C3 facets. Moderate severe C1-C2 degenerative change Upper chest: No acute finding Other: None IMPRESSION: 1. Limited evaluation of the brain due to artifact from left cochlear implant. No definite CT evidence for acute intracranial abnormality. Atrophy and chronic small vessel ischemic changes of the white matter. 2. Degenerative changes of the cervical spine. No acute osseous abnormality. Electronically Signed   By: Luke Bun M.D.   On: 06/04/2024 15:30   DG Chest 1 View Result Date: 06/04/2024 EXAM: 1 VIEW(S) XRAY OF THE CHEST 06/04/2024 02:33:00 PM COMPARISON: CT 05/12/2023. CLINICAL HISTORY: 809823 Fall 190176. FINDINGS: LUNGS AND PLEURA: No focal pulmonary opacity. No pleural effusion. No pneumothorax. HEART AND MEDIASTINUM: Atherosclerotic plaque. BONES AND SOFT TISSUES: Old healed right rib fractures. IMPRESSION: 1. No acute process. Electronically signed by: Dayne Hassell MD 06/04/2024 03:14 PM  EST RP Workstation: [id number]   DG Pelvis 1-2 Views Result Date: 06/04/2024 CLINICAL DATA:  Fall. EXAM: PELVIS - 1-2 VIEW COMPARISON:  None Available. FINDINGS: There is no evidence of pelvic fracture or diastasis. No pelvic bone lesions are seen. Mild osteoarthritis of both hips. Atherosclerosis of bilateral femoral arteries. IMPRESSION: No acute findings. Mild osteoarthritis of both hips. Electronically Signed   By: Marcey Moan M.D.   On: 06/04/2024 15:13   _______________________________________________________________________________________________________ Latest  Blood pressure (!) 186/95, pulse 81, temperature 97.8 F (36.6 C), temperature source Oral, resp. rate 20, SpO2 100%.   Vitals  labs and radiology finding personally  reviewed  Review of Systems:    Pertinent positives include:  syncope  Constitutional:  No weight loss, night sweats, Fevers, chills, fatigue, weight loss  HEENT:  No headaches, Difficulty swallowing,Tooth/dental problems,Sore throat,  No sneezing, itching, ear ache, nasal congestion, post nasal drip,  Cardio-vascular:  No chest pain, Orthopnea, PND, anasarca, dizziness, palpitations.no Bilateral lower extremity swelling  GI:  No heartburn, indigestion, abdominal pain, nausea, vomiting, diarrhea, change in bowel habits, loss of appetite, melena, blood in stool, hematemesis Resp:  no shortness of breath at rest. No dyspnea on exertion, No excess mucus, no productive cough, No non-productive cough, No coughing up of blood.No change in color of mucus.No wheezing. Skin:  no rash or lesions. No jaundice GU:  no dysuria, change in color of urine, no urgency or frequency. No straining to urinate.  No flank pain.  Musculoskeletal:  No joint pain or no joint swelling. No decreased range of motion. No back pain.  Psych:  No change in mood or affect. No depression or anxiety. No memory loss.  Neuro: no localizing neurological complaints, no tingling, no  weakness, no double vision, no gait abnormality, no slurred speech, no confusion  All systems reviewed and apart from HOPI all are negative _______________________________________________________________________________________________ Past Medical History:   Past Medical History:  Diagnosis Date   Atypical chest pain 05/12/2023   Bilateral hearing loss 07/29/2015   Bilateral impacted cerumen 07/29/2015   BPH (benign prostatic hyperplasia) 07/12/2021   Cardiac murmur 07/09/2021   Cerebrovascular disease 11/04/2016   Chronic combined systolic and diastolic congestive heart failure (HCC) 05/12/2023   Cochlear implant in place 02/24/2020   COPD (chronic obstructive pulmonary disease) (HCC)    COVID-19 virus infection 07/12/2021   Diabetes mellitus without complication (HCC)    Diet-controlled diabetes mellitus (HCC) 07/09/2021   Dilated cardiomyopathy (HCC) 07/14/2021   Dizziness and giddiness 11/04/2016   Essential hypertension 07/06/2021   Frailty 07/06/2021   Gallstones    Glaucoma    Hardening of the aorta (main artery of the heart) 07/06/2021   History of fall 05/12/2023   Hypertension    Hypomagnesemia 07/12/2021   Hyponatremia 07/12/2021   Hypophosphatemia 07/13/2021   Mild protein malnutrition 05/12/2023   Mini stroke    Normocytic anemia 07/12/2021   Odynophagia 05/12/2023   Osteoporosis 07/06/2021   Palpitations 07/09/2021   Pathological fracture of vertebra 07/06/2021   Peripheral vascular disorder due to diabetes mellitus (HCC) 08/23/2021   Protein calorie malnutrition 07/06/2021   Protein-calorie malnutrition, severe 07/12/2021   Pure hypercholesterolemia 07/06/2021   Sensorineural hearing loss (SNHL) of both ears 02/27/2018   SIRS (systemic inflammatory response syndrome) (HCC) 05/11/2023   Subclinical hypothyroidism 07/13/2021   Supraventricular tachycardia 07/09/2021   SVT (supraventricular tachycardia) 07/11/2021   Thrombocytopenia 07/12/2021   Type 2  diabetes mellitus with other circulatory complications (HCC) 07/06/2021   Unintentional weight loss 05/12/2023   Wears hearing aid in right ear 03/10/2020      Past Surgical History:  Procedure Laterality Date   APPENDECTOMY     CATARACT EXTRACTION W/ INTRAOCULAR LENS  IMPLANT, BILATERAL Bilateral    CHOLECYSTECTOMY     COCHLEAR IMPLANT     FINGER SURGERY Right 1958   index finger   FRACTURE SURGERY Left 1950   arm   INGUINAL HERNIA REPAIR Bilateral    12/06/05, 09/11/06   TONSILLECTOMY     VASECTOMY  1971    Social History:  Ambulatory  walker      reports that he has never  smoked. He has never used smokeless tobacco. He reports that he does not currently use alcohol . He reports that he does not use drugs.   Family History:   Family History  Problem Relation Age of Onset   Heart disease Mother    Heart disease Father    Diabetes Sister    Other Daughter        lymph node cancer   ______________________________________________________________________________________________ Allergies: Allergies[1]   Prior to Admission medications  Medication Sig Start Date End Date Taking? Authorizing Provider  alendronate (FOSAMAX) 70 MG tablet Take 70 mg by mouth once a week. 06/16/21   [provider]  amiodarone  (PACERONE ) 100 MG tablet TAKE 1 TABLET BY MOUTH DAILY 12/21/23   Revankar, Jennifer SAUNDERS, MD  aspirin  EC 325 MG tablet Take 325 mg by mouth daily.    [provider]  atorvastatin  (LIPITOR) 10 MG tablet Take 1 tablet (10 mg total) by mouth every evening. 08/31/21   Revankar, Jennifer SAUNDERS, MD  brimonidine  (ALPHAGAN ) 0.2 % ophthalmic solution Place 1 drop into both eyes 2 (two) times daily. 05/29/15   [provider]  carboxymethylcellul-glycerin (REFRESH OPTIVE) 0.5-0.9 % ophthalmic solution Place 1 drop into both eyes 2 (two) times daily.    [provider]  finasteride  (PROSCAR ) 5 MG tablet Take 5 mg by mouth daily. 04/19/21   [provider]   GEMTESA 75 MG TABS Take 75 mg by mouth daily.    [provider]  levothyroxine  (SYNTHROID ) 50 MCG tablet Take 50 mcg by mouth daily before breakfast. 10/04/22   [provider]  liothyronine  (CYTOMEL ) 25 MCG tablet Take 25 mcg by mouth daily. 02/21/23   [provider]  metoprolol  succinate (TOPROL -XL) 25 MG 24 hr tablet Take 0.5 tablets (12.5 mg total) by mouth daily. Take with or immediately following a meal. 05/16/23 05/10/24  Drusilla Sabas RAMAN, MD  Multiple Vitamins-Minerals (MULTIVITAMIN WITH MINERALS) tablet Take 1 tablet by mouth daily.    [provider]  Omega-3 Fatty Acids (FISH OIL) 1200 MG CAPS Take 1,200 mg by mouth daily.    [provider]  pantoprazole  (PROTONIX ) 40 MG tablet Take 1 tablet (40 mg total) by mouth daily. 08/11/23   Revankar, Rajan R, MD  polyethylene glycol (MIRALAX ) 17 g packet Take 17 g by mouth daily. 03/16/23   Tegeler, Lonni PARAS, MD  RESTASIS  0.05 % ophthalmic emulsion Place 1 drop into both eyes 2 (two) times daily. 06/12/23   [provider]  sacubitril -valsartan  (ENTRESTO ) 24-26 MG Take 1 tablet by mouth 2 (two) times daily. 03/22/24   RevankarJennifer SAUNDERS, MD    ___________________________________________________________________________________________________ Physical Exam:    06/04/2024    6:11 PM 06/04/2024    3:35 PM 11/20/2023    9:51 AM  Vitals with BMI  Height   5' 6.5  Weight   112 lbs  BMI   17.81  Systolic 186 182   Diastolic 95 97   Pulse 81 68     1. General:  in No  Acute distress pleasantly confused   Chronically ill  -appearing 2. Psychological: Alert and   Oriented to self 3. Head/ENT:   Dry Mucous Membranes                          Head Non traumatic, neck supple  Poor Dentition 4. SKIN: decreased Skin turgor,  Skin clean Dry and intact no rash    5. Heart: Regular rate and rhythm   Murmur, no Rub or gallop 6. Lungs:   no wheezes or crackles   7. Abdomen:  Soft,  non-tender, Non distended bowel sounds present 8. Lower extremities: no clubbing, cyanosis, no  edema 9. Neurologically Grossly intact, moving all 4 extremities equally   10. MSK: Normal range of motion    Chart has been reviewed  ______________________________________________________________________________________________  Assessment/Plan 89 y.o. male with medical history significant of Dm2, HOH status post cochlear implant, HTN, SVT, BPH, anemia, chronic combined systolic diastolic CHF, dementia, dysphagia, hypothyroidism, GERD, cerebrovascular disease   Admitted for   Syncope     Present on Admission:  Syncope  Type 2 diabetes mellitus with other circulatory complications (HCC)  Wears hearing aid in right ear  SVT (supraventricular tachycardia)  Subclinical hypothyroidism  Dilated cardiomyopathy (HCC)  Chronic combined systolic and diastolic congestive heart failure (HCC)  Cardiac murmur  BPH (benign prostatic hyperplasia)  UTI (urinary tract infection)     Syncope Syncope occurred post having a bowel movement Although family denies patient constipating or pushing Given slightly elevated troponins will order echogram Rehydrate Treat underlying UTI  Type 2 diabetes mellitus with other circulatory complications (HCC) Diet controlled monitor for hypoglycemia  Wears hearing aid in right ear Chronic stable  SVT (supraventricular tachycardia) Continue amiodarone  and Toprol   Subclinical hypothyroidism - Check TSH continue home medications Synthroid  at 25mcg po q day Need to confirm from family what patient is currently taking   Dilated cardiomyopathy (HCC) Currently appears to be slightly on the dry side avoid fluid overload repeat echo  Chronic combined systolic and diastolic congestive heart failure (HCC) Avoid fluid overload  Cardiac murmur Chronic stable  BPH (benign prostatic hyperplasia) Continue Proscar   UTI (urinary tract infection)  - treat  with Rocephin      await results of urine culture and adjust antibiotic coverage as needed   Other plan as per orders.  DVT prophylaxis:  SCD      Code Status:   DNR/DNI   as per  family  I had personally discussed CODE STATUS with patient and family  ACP   none    Family Communication:   Family  at  Bedside  plan of care was discussed   with   Wife,   Diet heart healthy/diabetic nectar thick   Disposition Plan:           To home once workup is complete and patient is stable   Following barriers for discharge:                            Syncope  work up is complete                            Electrolytes corrected                                    Consult Orders  (From admission, onward)           Start     Ordered   06/04/24 1803  Consult to hospitalist  Once       Provider:  (Not yet assigned)  Question Answer Comment  Place call to: [facility]   Reason  for Consult Admit      06/04/24 1802                               Would benefit from PT/OT eval prior to DC  Ordered                   Swallow eval - SLP ordered                                                   Nutrition    consulted                                    Consults called:    NONE   Admission status:  ED Disposition     ED Disposition  Admit   Condition  --   Comment  Hospital Area: [facility] [[id number]]  Level of Care: Telemetry [5]  Diagnosis: Syncope [206001]  Admitting Physician: Loraine Bhullar [[id number]]  Attending Physician: Levon Boettcher [[id number]]          Obs      Level of care     tele  For  indefinitely please discontinue once patient no longer qualifies COVID-19 Labs    Lavoy Bernards 06/04/2024, 7:59 PM    [facility]     after 2 AM please page floor coverage   If 7AM-7PM, please contact the day team taking care of the patient using [email or web address]        [1] No Known Allergies  "

## 2024-06-04 NOTE — Assessment & Plan Note (Signed)
-   Check TSH continue home medications Synthroid  at 25mcg po q day Need to confirm from family what patient is currently taking

## 2024-06-04 NOTE — ED Triage Notes (Signed)
 Patient arrives via [facility] for unwitnessed fall, unknown loc, no thinners. C collar for precaution. Family says he is intermittently alert oriented x 2 - 3. Patient extremely hard of hearing. Patient with lac to back of head.   182/90 HR 64 99 on room air Cbg 225

## 2024-06-04 NOTE — ED Provider Notes (Signed)
 " [place] EMERGENCY DEPARTMENT AT [facility] Provider Note   CSN: [id number] Arrival date & time: 06/04/24  1335     Patient presents with: Raymond   Nero Fowler is a 89 y.o. male with history of diabetes, hypertension, cochlear implant with sensorineural hearing loss presents the emergency department today for evaluation after fall.  Patient is significantly hard of hearing and is difficult to obtain history however he denies any pain other than his head where he has an abrasion.  He reports that he fell in the bathroom however when asked additional questions he cannot understand so history is limited.  I did call the wife without any answer to the cell phone.  She did end up showing up at bedside.  She reports that the patient and ambulate to the bathroom, she did not hear him fall however heard him say that he had fallen.  She walked to the bathroom to help him onto the toilet where he had a softer bowel movement.  She reports that when they went to stand up from the toilet he became limp and she lowered him down to the ground where he had another small bowel movement.  Denies any muscle rigidity or any shaking.  She reports that this was less than a minute and has been at his baseline since.  She reports that he did not have any complaints before or after the episode.  She reports that he has been at his baseline before and after the event today. Patient is oriented to person, place, and month- but not year. Wife denies any blood thinner use.   Fall       Prior to Admission medications  Medication Sig Start Date End Date Taking? Authorizing Provider  alendronate (FOSAMAX) 70 MG tablet Take 70 mg by mouth once a week. 06/16/21   [provider]  amiodarone  (PACERONE ) 100 MG tablet TAKE 1 TABLET BY MOUTH DAILY 12/21/23   Revankar, Jennifer SAUNDERS, MD  aspirin  EC 325 MG tablet Take 325 mg by mouth daily.    [provider]  atorvastatin  (LIPITOR) 10 MG tablet Take 1 tablet  (10 mg total) by mouth every evening. 08/31/21   Revankar, Jennifer SAUNDERS, MD  brimonidine  (ALPHAGAN ) 0.2 % ophthalmic solution Place 1 drop into both eyes 2 (two) times daily. 05/29/15   [provider]  carboxymethylcellul-glycerin (REFRESH OPTIVE) 0.5-0.9 % ophthalmic solution Place 1 drop into both eyes 2 (two) times daily.    [provider]  finasteride  (PROSCAR ) 5 MG tablet Take 5 mg by mouth daily. 04/19/21   [provider]  GEMTESA 75 MG TABS Take 75 mg by mouth daily.    [provider]  levothyroxine  (SYNTHROID ) 50 MCG tablet Take 50 mcg by mouth daily before breakfast. 10/04/22   [provider]  liothyronine  (CYTOMEL ) 25 MCG tablet Take 25 mcg by mouth daily. 02/21/23   [provider]  metoprolol  succinate (TOPROL -XL) 25 MG 24 hr tablet Take 0.5 tablets (12.5 mg total) by mouth daily. Take with or immediately following a meal. 05/16/23 05/10/24  Drusilla Sabas RAMAN, MD  Multiple Vitamins-Minerals (MULTIVITAMIN WITH MINERALS) tablet Take 1 tablet by mouth daily.    [provider]  Omega-3 Fatty Acids (FISH OIL) 1200 MG CAPS Take 1,200 mg by mouth daily.    [provider]  pantoprazole  (PROTONIX ) 40 MG tablet Take 1 tablet (40 mg total) by mouth daily. 08/11/23   Revankar, Rajan R, MD  polyethylene glycol (MIRALAX ) 17  g packet Take 17 g by mouth daily. 03/16/23   Tegeler, Lonni PARAS, MD  RESTASIS  0.05 % ophthalmic emulsion Place 1 drop into both eyes 2 (two) times daily. 06/12/23   [provider]  sacubitril -valsartan  (ENTRESTO ) 24-26 MG Take 1 tablet by mouth 2 (two) times daily. 03/22/24   Revankar, Jennifer SAUNDERS, MD    Allergies: Patient has no known allergies.    Review of Systems  Reason unable to perform ROS: patient hard of hearing.    Updated Vital Signs There were no vitals taken for this visit.  Physical Exam Vitals and nursing note reviewed.  Constitutional:      General: He is not in acute distress.     Appearance: He is not toxic-appearing.     Comments: Cachectic, pleasant  HENT:     Head:      Comments: No battle signs or raccoon eyes.  Small abrasion/laceration seen.  Looks more of a contusion but does have a very small area of open skin.  Will place 1 staple here.  Cochlear implant present on the left    Mouth/Throat:     Mouth: Mucous membranes are moist.  Eyes:     General: No scleral icterus.    Pupils: Pupils are equal, round, and reactive to light.  Neck:     Comments: In c-collar Cardiovascular:     Rate and Rhythm: Normal rate.  Pulmonary:     Effort: Pulmonary effort is normal. No respiratory distress.  Abdominal:     Palpations: Abdomen is soft.     Tenderness: There is no abdominal tenderness. There is no guarding or rebound.     Comments: Soft, atraumatic.  Nontender.  Musculoskeletal:        General: No tenderness or signs of injury.     Comments: Moving all extremities spontaneously.  Bilateral upper and lower extremities were palpated thoroughly and were nontender to palpation.  Compartments are soft.  Do not see any signs of trauma to the legs, chest, belly, or back.  Back is nontender to palpation to the midline or paraspinal musculature.  Skin:    General: Skin is warm and dry.  Neurological:     General: No focal deficit present.     Mental Status: He is alert.     Comments: Oriented to person, place, and month but not year.  Difficult to perform neurological examination to the patient is hard of hearing and unable to follow directions.  He spontaneously moving all extremities with and without resistance.  No facial droop     (all labs ordered are listed, but only abnormal results are displayed) Labs Reviewed  CBC WITH DIFFERENTIAL/PLATELET  COMPREHENSIVE METABOLIC PANEL WITH GFR  URINALYSIS, ROUTINE W REFLEX MICROSCOPIC  CK  TROPONIN T, HIGH SENSITIVITY    EKG: None  Radiology: No results found.  .Laceration Repair  Date/Time: 06/04/2024 3:55  PM  Performed by: Bernis Ernst, PA-C Authorized by: Bernis Ernst, PA-C   Consent:    Consent obtained:  Verbal   Consent given by:  Patient   Risks, benefits, and alternatives were discussed: yes     Risks discussed:  Pain and poor cosmetic result   Alternatives discussed:  No treatment Universal protocol:    Procedure explained and questions answered to patient or proxy's satisfaction: yes     Imaging studies available: yes     Patient identity confirmed:  Verbally with patient Anesthesia:    Anesthesia method:  None Laceration details:  Location:  Scalp   Scalp location:  Crown   Length (cm):  0.5 Treatment:    Area cleansed with:  Saline (peroxide)   Irrigation solution:  Sterile saline Skin repair:    Repair method:  Staples   Number of staples:  1 Post-procedure details:    Procedure completion:  Tolerated well, no immediate complications    Medications Ordered in the ED - No data to display  Clinical Course as of 06/04/24 1500  Tue Jun 04, 2024  1346 Attempted to call wife without answer. No voicemail option.  [RR]  1438 Spoke with wife now at bedside.  [RR]    Clinical Course User Index [RR] Bernis Ernst, PA-C                              Medical Decision Making Amount and/or Complexity of Data Reviewed Labs: ordered. Radiology: ordered.   89 y.o. male presents to the ER for evaluation of fall. Differential diagnosis includes but is not limited to trauma, electrolyte abnormality, syncope. Vital signs elevated BP otherwise unremarkable. Physical exam as noted above.   Lab work ordered.  Patient has not have any tenderness to palpation other than a mild amount near the abrasion seen to his posterior scalp.  He is moving all extremities.  All extremities, hips, chest, belly, and diffuse back are nontender to palpation.  He is moving all extremities.  Examination is difficulty due to patient's hard of hearing.  He does not appear in any acute distress and is  pleasant. I have ordered chest x-ray and an x-ray of the pelvis.  CT of the head and C-spine ordered as well.  Tdap ordered in 2018. Question possible syncopal episode. Patient will need admission once lab results.   3:01 PM Care of Wilberth Damon  transferred to PA Harris at the end of my shift as the patient will require reassessment once labs/imaging have resulted. Patient presentation, ED course, and plan of care discussed with review of all pertinent labs and imaging. Please see his/her note for further details regarding further ED course and disposition. Plan at time of handoff is follow up with labs and imaging, will need admission. This may be altered or completely changed at the discretion of the oncoming team pending results of further workup.  Portions of this report may have been transcribed using voice recognition software. Every effort was made to ensure accuracy; however, inadvertent computerized transcription errors may be present.    Final diagnoses:  None    ED Discharge Orders     None          Bernis Ernst, [place] 06/04/24 1604  "

## 2024-06-04 NOTE — Assessment & Plan Note (Signed)
Avoid fluid over load 

## 2024-06-04 NOTE — Assessment & Plan Note (Signed)
 Diet controlled monitor for hypoglycemia

## 2024-06-04 NOTE — Assessment & Plan Note (Signed)
-   treat with Rocephin         await results of urine culture and adjust antibiotic coverage as needed  

## 2024-06-04 NOTE — ED Provider Notes (Signed)
 Hx of HOH  Syncope vs fall  + head injury  CT Head and beg - no abnormal findings Chest xray negative  + syncope after using the bathroom-  -lost control of his bowels  Physical Exam  BP (!) 182/97 (BP Location: Left Wrist)   Pulse 68   Temp 98 F (36.7 C) (Oral)   Resp 16   SpO2 100%   Physical Exam  Procedures  Procedures  ED Course / MDM   Clinical Course as of 06/04/24 1537  Tue Jun 04, 2024  1346 Attempted to call wife without answer. No voicemail option.  [RR]  1438 Spoke with wife now at bedside.  [RR]    Clinical Course User Index [RR] Bernis Ernst, PA-C   Medical Decision Making Amount and/or Complexity of Data Reviewed Labs: ordered. Radiology: ordered.  Risk Decision regarding hospitalization.  High risk 89 y/o male with syncope Last echo 2024 ef 40%  Imaging is negative        Raymond Chroman, PA-C 06/06/24 1456    Franklyn Sid SAILOR, MD 06/06/24 1544

## 2024-06-04 NOTE — Assessment & Plan Note (Signed)
 Currently appears to be slightly on the dry side avoid fluid overload repeat echo

## 2024-06-04 NOTE — Assessment & Plan Note (Signed)
-   Continue amiodarone and Toprol

## 2024-06-04 NOTE — Assessment & Plan Note (Signed)
 Continue Proscar 

## 2024-06-05 ENCOUNTER — Observation Stay (HOSPITAL_COMMUNITY)

## 2024-06-05 DIAGNOSIS — R55 Syncope and collapse: Secondary | ICD-10-CM | POA: Diagnosis not present

## 2024-06-05 LAB — COMPREHENSIVE METABOLIC PANEL WITH GFR
ALT: 18 U/L (ref 0–44)
AST: 21 U/L (ref 15–41)
Albumin: 3.8 g/dL (ref 3.5–5.0)
Alkaline Phosphatase: 63 U/L (ref 38–126)
Anion gap: 9 (ref 5–15)
BUN: 17 mg/dL (ref 8–23)
CO2: 26 mmol/L (ref 22–32)
Calcium: 8.7 mg/dL — ABNORMAL LOW (ref 8.9–10.3)
Chloride: 102 mmol/L (ref 98–111)
Creatinine, Ser: 0.99 mg/dL (ref 0.61–1.24)
GFR, Estimated: >60 mL/min
Glucose, Bld: 146 mg/dL — ABNORMAL HIGH (ref 70–99)
Potassium: 4 mmol/L (ref 3.5–5.1)
Sodium: 137 mmol/L (ref 135–145)
Total Bilirubin: 0.5 mg/dL (ref 0.0–1.2)
Total Protein: 6.5 g/dL (ref 6.5–8.1)

## 2024-06-05 LAB — ECHOCARDIOGRAM COMPLETE
AR max vel: 1.79 cm2
AV Area VTI: 2.11 cm2
AV Area mean vel: 1.52 cm2
AV Mean grad: 3 mmHg
AV Peak grad: 6.3 mmHg
Ao pk vel: 1.25 m/s
Area-P 1/2: 6.43 cm2
Calc EF: 41.2 %
P 1/2 time: 535 ms
S' Lateral: 3.8 cm
Single Plane A2C EF: 37.9 %
Single Plane A4C EF: 40.9 %

## 2024-06-05 LAB — CBG MONITORING, ED
Glucose-Capillary: 146 mg/dL — ABNORMAL HIGH (ref 70–99)
Glucose-Capillary: 195 mg/dL — ABNORMAL HIGH (ref 70–99)
Glucose-Capillary: 163 mg/dL — ABNORMAL HIGH (ref 70–99)

## 2024-06-05 LAB — CBC
HCT: 40.5 % (ref 39.0–52.0)
Hemoglobin: 13.6 g/dL (ref 13.0–17.0)
MCH: 30.2 pg (ref 26.0–34.0)
MCHC: 33.6 g/dL (ref 30.0–36.0)
MCV: 89.8 fL (ref 80.0–100.0)
Platelets: 270 K/uL (ref 150–400)
RBC: 4.51 MIL/uL (ref 4.22–5.81)
RDW: 13.8 % (ref 11.5–15.5)
WBC: 10.2 K/uL (ref 4.0–10.5)
nRBC: 0 % (ref 0.0–0.2)

## 2024-06-05 LAB — PHOSPHORUS: Phosphorus: 2.9 mg/dL (ref 2.5–4.6)

## 2024-06-05 LAB — PREALBUMIN: Prealbumin: 22 mg/dL (ref 18–38)

## 2024-06-05 LAB — CREATININE, URINE, RANDOM: Creatinine, Urine: 42 mg/dL

## 2024-06-05 LAB — OSMOLALITY, URINE: Osmolality, Ur: 424 mosm/kg (ref 300–900)

## 2024-06-05 LAB — MAGNESIUM: Magnesium: 2 mg/dL (ref 1.7–2.4)

## 2024-06-05 NOTE — Evaluation (Signed)
 Physical Therapy Evaluation Patient Details Name: Raymond Fowler MRN: [id number] DOB: [birth year] Today's Date: 06/05/2024  History of Present Illness  89 yo M adm 1/6 after syncopal event on toilet. Also with +UTI. PMH DM2, HOH s/p cochlear implant HTN CHF dementia dysphagia hypothyroidism GERD CVA  Clinical Impression  Pt is a poor historian with history taken from chart review. PTA pt had assist from spouse for all mobility as well as for dressing/bathing. Pt typically ambulates with 1HH as he has been unsafe with RW trials. Pt likely at baseline requiring MinA for bed mobility and MinA to ambulate 23ft with 1HH. Recommending return home with 24/7 assist as long as family is able to continue providing support. No further acute or post-acute PT needs with acute PT signing off. Recommend continued mobility with staff and mobility specialist while in the hospital. Please re-consult if new needs arise.       If plan is discharge home, recommend the following: Assist for transportation;Help with stairs or ramp for entrance;A lot of help with walking and/or transfers;A lot of help with bathing/dressing/bathroom;Assistance with cooking/housework;Direct supervision/assist for medications management;Direct supervision/assist for financial management   Can travel by private vehicle    Yes    Equipment Recommendations None recommended by PT     Functional Status Assessment Patient has had a recent decline in their functional status and demonstrates the ability to make significant improvements in function in a reasonable and predictable amount of time.     Precautions / Restrictions Precautions Precautions: Fall Recall of Precautions/Restrictions: Impaired Restrictions Weight Bearing Restrictions Per Provider Order: No      Mobility  Bed Mobility Overal bed mobility: Needs Assistance Bed Mobility: Supine to Sit, Sit to Supine    Supine to sit: Min assist Sit to supine: Contact guard assist    General bed mobility comments: MinA for trunk elevation. Able to scoot forwards with cueing. Also able to move once in supine towards [facility] with no assist    Transfers Overall transfer level: Needs assistance Equipment used: 1 person hand held assist Transfers: Sit to/from Stand Sit to Stand: Min assist    General transfer comment: MinA via 1HH for slight steadying assist    Ambulation/Gait Ambulation/Gait assistance: Min assist Gait Distance (Feet): 60 Feet Assistive device: 1 person hand held assist Gait Pattern/deviations: Step-through pattern, Decreased stride length Gait velocity: decr    General Gait Details: MinA via 1HH for steadying assist. Cueing needed for directions     Balance Overall balance assessment: Needs assistance, Mild deficits observed, not formally tested, History of Falls Sitting-balance support: No upper extremity supported, Feet supported Sitting balance-Leahy Scale: Fair    Standing balance support: Single extremity supported, During functional activity, Reliant on assistive device for balance Standing balance-Leahy Scale: Poor Standing balance comment: reliant on 1UE support       Pertinent Vitals/Pain Pain Assessment Pain Assessment: No/denies pain    Home Living Family/patient expects to be discharged to:: Private residence Living Arrangements: Spouse/significant other Available Help at Discharge: Family;Available 24 hours/day Type of Home: House Home Access: Stairs to enter Entrance Stairs-Rails: None Entrance Stairs-Number of Steps: 1   Home Layout: Two level;Full bath on main level;Able to live on main level with bedroom/bathroom Home Equipment: Rolling Walker (2 wheels);Cane - single point      Prior Function Prior Level of Function : Needs assist;History of Falls (last six months);Patient poor historian/Family not available    Mobility Comments: Per chart review, spouse provides HHA for gait tasks  in home and community, pt has not  been safe with trials using RW in past, pt required min to mod A for transfers and bed mobiltiy at baseline, assist with step navigation to enter home ADLs Comments: Per chart review, requires A for upper body and lower body dressing as well as set up and occational assist for sponge bathing seated     Extremity/Trunk Assessment   Upper Extremity Assessment Upper Extremity Assessment: Defer to OT evaluation    Lower Extremity Assessment Lower Extremity Assessment: Generalized weakness    Cervical / Trunk Assessment Cervical / Trunk Assessment: Kyphotic  Communication   Communication Communication: Impaired Factors Affecting Communication: Hearing impaired (R ear hearing loss)    Cognition Arousal: Alert Behavior During Therapy: WFL for tasks assessed/performed   PT - Cognitive impairments: History of cognitive impairments    PT - Cognition Comments: hx of dementia Following commands: Impaired Following commands impaired: Follows one step commands inconsistently, Follows one step commands with increased time     Cueing Cueing Techniques: Verbal cues, Tactile cues, Visual cues     General Comments General comments (skin integrity, edema, etc.): VSS on RA     PT Assessment Patient does not need any further PT services         PT Goals (Current goals can be found in the Care Plan section)  Acute Rehab PT Goals PT Goal Formulation: All assessment and education complete, DC therapy         Co-evaluation   Reason for Co-Treatment: For patient/therapist safety;To address functional/ADL transfers;Necessary to address cognition/behavior during functional activity PT goals addressed during session: Mobility/safety with mobility;Balance         AM-PAC PT 6 Clicks Mobility  Outcome Measure Help needed turning from your back to your side while in a flat bed without using bedrails?: A Little Help needed moving from lying on your back to sitting on the side of a flat  bed without using bedrails?: A Little Help needed moving to and from a bed to a chair (including a wheelchair)?: A Little Help needed standing up from a chair using your arms (e.g., wheelchair or bedside chair)?: A Little Help needed to walk in hospital room?: A Little Help needed climbing 3-5 steps with a railing? : A Lot 6 Click Score: 17    End of Session Equipment Utilized During Treatment: Gait belt Activity Tolerance: Patient tolerated treatment well Patient left: in bed;with call bell/phone within reach Nurse Communication: Mobility status PT Visit Diagnosis: Other abnormalities of gait and mobility (R26.89)    Time: 9169-9153 PT Time Calculation (min) (ACUTE ONLY): 16 min   Charges:   PT Evaluation $PT Eval Low Complexity: 1 Low   PT General Charges $$ ACUTE PT VISIT: 1 Visit       Kate ORN, PT, DPT Secure Chat Preferred  Rehab Office [phone]  Kate BRAVO Wendolyn 06/05/2024, 9:35 AM

## 2024-06-05 NOTE — Evaluation (Signed)
 Occupational Therapy Evaluation and Discharge Patient Details Name: Raymond Fowler MRN: [id number] DOB: 11-20-1932 Today's Date: 06/05/2024   History of Present Illness   89 yo M adm 1/6 after syncopal event on toilet. Also with +UTI. PMH DM2, HOH s/p cochlear implant HTN CHF dementia dysphagia hypothyroidism GERD CVA     Clinical Impressions Pt is unable to offer PLOF, per chart he was walking with HHA and assisted for much of his ADLs. Pt is currently able to self feed with set up and ambulate with one person hand held assist. He has a chronic foley with leg bag. Pt is likely near his baseline. Recommend return home with support of his family. No further OT needs.      If plan is discharge home, recommend the following:   A little help with walking and/or transfers;A lot of help with bathing/dressing/bathroom     Functional Status Assessment   Patient has had a recent decline in their functional status and demonstrates the ability to make significant improvements in function in a reasonable and predictable amount of time.     Equipment Recommendations   None recommended by OT     Recommendations for Other Services         Precautions/Restrictions   Precautions Precautions: Fall Recall of Precautions/Restrictions: Impaired Restrictions Weight Bearing Restrictions Per Provider Order: No     Mobility Bed Mobility Overal bed mobility: Needs Assistance Bed Mobility: Supine to Sit, Sit to Supine     Supine to sit: Min assist Sit to supine: Contact guard assist   General bed mobility comments: MinA for trunk elevation. Able to scoot forwards with cueing. Also able to move once in supine towards [facility] with no assist    Transfers Overall transfer level: Needs assistance Equipment used: 1 person hand held assist Transfers: Sit to/from Stand Sit to Stand: Min assist           General transfer comment: MinA via 1HH for slight steadying assist      Balance  Overall balance assessment: Needs assistance, Mild deficits observed, not formally tested, History of Falls   Sitting balance-Leahy Scale: Fair       Standing balance-Leahy Scale: Poor Standing balance comment: reliant on 1UE support                           ADL either performed or assessed with clinical judgement   ADL Overall ADL's : At baseline                                       General ADL Comments: self feeds with set up, pt likely otherwise dependent     Vision Ability to See in Adequate Light: 0 Adequate       Perception         Praxis         Pertinent Vitals/Pain Pain Assessment Pain Assessment: Faces Faces Pain Scale: No hurt     Extremity/Trunk Assessment Upper Extremity Assessment Upper Extremity Assessment: Overall WFL for tasks assessed;Right hand dominant   Lower Extremity Assessment Lower Extremity Assessment: Defer to PT evaluation   Cervical / Trunk Assessment Cervical / Trunk Assessment: Kyphotic   Communication Communication Communication: Impaired Factors Affecting Communication: Hearing impaired;Reduced clarity of speech   Cognition Arousal: Alert Behavior During Therapy: WFL for tasks assessed/performed Cognition: History of cognitive impairments  Following commands: Impaired Following commands impaired: Follows one step commands inconsistently, Follows one step commands with increased time     Cueing  General Comments   Cueing Techniques: Verbal cues;Tactile cues;Visual cues  VSS on RA   Exercises     Shoulder Instructions      Home Living Family/patient expects to be discharged to:: Private residence Living Arrangements: Spouse/significant other Available Help at Discharge: Family;Available 24 hours/day Type of Home: House Home Access: Stairs to enter [employer] of Steps: 1 Entrance Stairs-Rails: None Home Layout: Two level;Full  bath on main level;Able to live on main level with bedroom/bathroom               Home Equipment: Rolling Walker (2 wheels);Cane - single point          Prior Functioning/Environment Prior Level of Function : Needs assist;History of Falls (last six months);Patient poor historian/Family not available             Mobility Comments: Per chart review, spouse provides HHA for gait tasks in home and community, pt has not been safe with trials using RW in past, pt required min to mod A for transfers and bed mobiltiy at baseline, assist with step navigation to enter home ADLs Comments: Per chart review, requires A for upper body and lower body dressing as well as set up and occational assist for sponge bathing seated    OT Problem List:     OT Treatment/Interventions:        OT Goals(Current goals can be found in the care plan section)       OT Frequency:       Co-evaluation   Reason for Co-Treatment: For patient/therapist safety;To address functional/ADL transfers;Necessary to address cognition/behavior during functional activity PT goals addressed during session: Mobility/safety with mobility;Balance        AM-PAC OT 6 Clicks Daily Activity     Outcome Measure Help from another person eating meals?: A Little Help from another person taking care of personal grooming?: A Lot Help from another person toileting, which includes using toliet, bedpan, or urinal?: Total Help from another person bathing (including washing, rinsing, drying)?: A Lot Help from another person to put on and taking off regular upper body clothing?: A Lot Help from another person to put on and taking off regular lower body clothing?: A Lot 6 Click Score: 12   End of Session Equipment Utilized During Treatment: Gait belt Nurse Communication: Mobility status  Activity Tolerance: Patient tolerated treatment well Patient left: in bed;with call bell/phone within reach  OT Visit Diagnosis:  Unsteadiness on feet (R26.81);Other symptoms and signs involving cognitive function                Time: 9168-9152 OT Time Calculation (min): 16 min Charges:  OT General Charges $OT Visit: 1 Visit OT Evaluation $OT Eval Low Complexity: 1 Low  Mliss HERO, OTR/L Acute Rehabilitation Services Office: [phone]   Kennth Mliss Helling 06/05/2024, 9:58 AM

## 2024-06-05 NOTE — Care Management Obs Status (Signed)
 MEDICARE OBSERVATION STATUS NOTIFICATION   Patient Details  Name: Manases Etchison MRN: [id number] Date of Birth: January 13, 1933   Medicare Observation Status Notification Given:  Yes    Nena LITTIE Coffee, RN 06/05/2024, 10:09 AM

## 2024-06-05 NOTE — Progress Notes (Signed)
" °   06/05/24 1014  TOC Brief Assessment  Insurance and Status Reviewed  Patient has primary care physician Yes  Home environment has been reviewed From home c/wife  Prior level of function: Assisted  Prior/Current Home Services No current home services  Social Drivers of Health Review SDOH reviewed no interventions necessary  Readmission risk has been reviewed Yes  Transition of care needs no transition of care needs at this time   Current DME: walker. Pt plans to return home. If HH is recommended pt has no preferred agency.  Wife to provide transportation at dc.   Transition of Care Department [facility]) has reviewed patient and will continue to monitor patient advancement. "

## 2024-06-05 NOTE — Evaluation (Signed)
 Clinical/Bedside Swallow Evaluation Patient Details  Name: Raymond Fowler MRN: [id number] Date of Birth: 1932/06/05  Today's Date: 06/05/2024 Time: SLP Start Time (ACUTE ONLY): 0940 SLP Stop Time (ACUTE ONLY): 1000 SLP Time Calculation (min) (ACUTE ONLY): 20 min  Past Medical History:  Past Medical History:  Diagnosis Date   Atypical chest pain 05/12/2023   Bilateral hearing loss 07/29/2015   Bilateral impacted cerumen 07/29/2015   BPH (benign prostatic hyperplasia) 07/12/2021   Cardiac murmur 07/09/2021   Cerebrovascular disease 11/04/2016   Chronic combined systolic and diastolic congestive heart failure (HCC) 05/12/2023   Cochlear implant in place 02/24/2020   COPD (chronic obstructive pulmonary disease) (HCC)    COVID-19 virus infection 07/12/2021   Diabetes mellitus without complication (HCC)    Diet-controlled diabetes mellitus (HCC) 07/09/2021   Dilated cardiomyopathy (HCC) 07/14/2021   Dizziness and giddiness 11/04/2016   Essential hypertension 07/06/2021   Frailty 07/06/2021   Gallstones    Glaucoma    Hardening of the aorta (main artery of the heart) 07/06/2021   History of fall 05/12/2023   Hypertension    Hypomagnesemia 07/12/2021   Hyponatremia 07/12/2021   Hypophosphatemia 07/13/2021   Mild protein malnutrition 05/12/2023   Mini stroke    Normocytic anemia 07/12/2021   Odynophagia 05/12/2023   Osteoporosis 07/06/2021   Palpitations 07/09/2021   Pathological fracture of vertebra 07/06/2021   Peripheral vascular disorder due to diabetes mellitus (HCC) 08/23/2021   Protein calorie malnutrition 07/06/2021   Protein-calorie malnutrition, severe 07/12/2021   Pure hypercholesterolemia 07/06/2021   Sensorineural hearing loss (SNHL) of both ears 02/27/2018   SIRS (systemic inflammatory response syndrome) (HCC) 05/11/2023   Subclinical hypothyroidism 07/13/2021   Supraventricular tachycardia 07/09/2021   SVT (supraventricular tachycardia) 07/11/2021    Thrombocytopenia 07/12/2021   Type 2 diabetes mellitus with other circulatory complications (HCC) 07/06/2021   Unintentional weight loss 05/12/2023   Wears hearing aid in right ear 03/10/2020   Past Surgical History:  Past Surgical History:  Procedure Laterality Date   APPENDECTOMY     CATARACT EXTRACTION W/ INTRAOCULAR LENS  IMPLANT, BILATERAL Bilateral    CHOLECYSTECTOMY     COCHLEAR IMPLANT     FINGER SURGERY Right 1958   index finger   FRACTURE SURGERY Left 1950   arm   INGUINAL HERNIA REPAIR Bilateral    12/06/05, 09/11/06   TONSILLECTOMY     VASECTOMY  1971   HPI:  Raymond Fowler is an 89 yo M adm from home on 1/6 after syncopal event on toilet. Also with +UTI. PMH dementia, DM2, HOH s/p cochlear implant, frailty and protein calorie malnutrition, HTN, CHF, dysphagia, hypothyroidism, GERD, CVA.  MBS 05/13/2023 during hosp admission after piece of meat lodged in his esophagus.  MBS showed mild dysphagia with trace aspiration of thins, overall weakness, dysphagia 3/nectar liquids was recommended.    Assessment / Plan / Recommendation  Clinical Impression  Pt presents with very functional swallowing with mild risk for developing an aspiration pna.  Demonstrated thorough mastication, no overt s/s of aspiration with thin liquids. His wife reports no further choking episodes since Dec 2024 events. He drinks nectar thick liquids in the morning, then thin liquids for the rest of the day. He has had no incidents of pna in the last year.  Since he has been drinking thin liquids for a year without incident, he appears to have low susceptibility at this time for developing a dysphagia-related pna. Recommend continuing regular diet, remove thickened liquid restrictions and allow thins; spouse should focus  on maintaining mobility and increasing oral care - these are risk factors that can be modified to help stave off potential pna. Mrs. Whitmire agrees with plan. No further SLP f/u is needed. Will sign  off. SLP Visit Diagnosis: Dysphagia, unspecified (R13.10)    Aspiration Risk  Mild aspiration risk    Diet Recommendation   Thin;Age appropriate regular  Medication Administration: Whole meds with puree    Other Recommendations Oral Care Recommendations: Oral care QID     Swallow Evaluation Recommendations     Assistance Recommended at Discharge    Functional Status Assessment    Frequency and Duration            Prognosis        Swallow Study   General Date of Onset: 06/04/24 HPI: Raymond Fowler is an 89 yo M adm from home on 1/6 after syncopal event on toilet. Also with +UTI. PMH dementia, DM2, HOH s/p cochlear implant, frailty and protein calorie malnutrition, HTN, CHF, dysphagia, hypothyroidism, GERD, CVA.  MBS 05/13/2023 during hosp admission after piece of meat lodged in his esophagus.  MBS showed mild dysphagia with trace aspiration of thins, overall weakness, dysphagia 3/nectar liquids was recommended. Type of Study: Bedside Swallow Evaluation Previous Swallow Assessment: see HPI Diet Prior to this Study: Regular;Mildly thick liquids (Level 2, nectar thick) Temperature Spikes Noted: No Respiratory Status: Room air History of Recent Intubation: No Behavior/Cognition: Alert;Cooperative;Pleasant mood Oral Cavity Assessment: Within Functional Limits Oral Care Completed by SLP: Recent completion by staff Oral Cavity - Dentition: Adequate natural dentition Vision: Functional for self-feeding Self-Feeding Abilities: Able to feed self Patient Positioning: Upright in bed Baseline Vocal Quality: Normal Volitional Cough: Cognitively unable to elicit Volitional Swallow: Unable to elicit    Oral/Motor/Sensory Function Overall Oral Motor/Sensory Function: Within functional limits   Ice Chips Ice chips: Within functional limits   Thin Liquid Thin Liquid: Within functional limits    Nectar Thick Nectar Thick Liquid: Not tested   Honey Thick Honey Thick Liquid: Not tested    Puree Puree: Within functional limits   Solid     Solid: Within functional limits      Vona Palma Laurice 06/05/2024,10:05 AM  Palma L. Vona, MA CCC/SLP Clinical Specialist - Acute Care SLP Acute Rehabilitation Services Office number [phone]

## 2024-06-05 NOTE — Progress Notes (Addendum)
 " PROGRESS NOTE    Raymond Fowler  FMW:[id number] DOB: 11-06-1932 DOA: 06/04/2024 PCP: Nanci Senior, MD  Brief Note:  89 y.o. male with medical history significant of Dm2, HOH status post cochlear implant, HTN, SVT, BPH, anemia, chronic combined systolic diastolic CHF, dementia, dysphagia, hypothyroidism, GERD, cerebrovascular disease   Admitted for   Syncope    Subjective:   Patient seen in the ED wife at the bedside.  He feels well.  Has chronic Foley catheter in place.  Every month.  Hospital Course:    Assessment and Plan:  Syncope Syncope occurred post having a bowel movement Although family denies patient constipating or pushing Troponins relatively flat ( 48--> 48--> 36--> 42 ) 2D echo ordered Rehydrate Treat underlying UTI   Type 2 diabetes mellitus with other circulatory complications (HCC) Diet controlled monitor for hypoglycemia   Wears hearing aid in right ear Chronic stable   SVT (supraventricular tachycardia) Continue amiodarone  and Toprol    Subclinical hypothyroidism - Check TSH continue home medications Synthroid  at 25mcg po q day Need to confirm from family what patient is currently taking     Dilated cardiomyopathy (HCC) Currently appears to be slightly on the dry side avoid fluid overload repeat echo   Chronic combined systolic and diastolic congestive heart failure (HCC) Avoid fluid overload   Cardiac murmur Chronic stable   BPH (benign prostatic hyperplasia) Continue Proscar    UTI (urinary tract infection)  - treat with Rocephin      await results of urine culture and adjust antibiotic coverage as needed - History of chronic indwelling Foley catheter    DVT prophylaxis: SCDs Start: 06/04/24 1935  SCDs   Code Status: Limited: Do not attempt resuscitation (DNR) -DNR-LIMITED -Do Not Intubate/DNI  Family Communication: Discussed with  the wife who is at the bedside Disposition Plan: Home with home health Reason for continuing need for  hospitalization: Follow cultures and clinical course  Objective: Vitals:   06/04/24 2230 06/05/24 0130 06/05/24 0430 06/05/24 0500  BP: (!) 143/93 (!) 146/87 (!) 136/98 (!) 153/98  Pulse: 80 69 72 77  Resp: 18 20 (!) 21 16  Temp: 98 F (36.7 C) 98 F (36.7 C)  98.1 F (36.7 C)  TempSrc: Oral Oral  Oral  SpO2: 99% 98% 100% 98%    Intake/Output Summary (Last 24 hours) at 06/05/2024 0739 Last data filed at 06/04/2024 2049 Gross per 24 hour  Intake 100 ml  Output --  Net 100 ml   There were no vitals filed for this visit.  Examination: Dysarthria at baseline Physical Exam HENT:     Head: Normocephalic and atraumatic.     Nose: Nose normal.     Mouth/Throat:     Mouth: Mucous membranes are moist.     Pharynx: Oropharynx is clear.  Eyes:     Extraocular Movements: Extraocular movements intact.     Conjunctiva/sclera: Conjunctivae normal.     Pupils: Pupils are equal, round, and reactive to light.  Cardiovascular:     Rate and Rhythm: Normal rate and regular rhythm.     Heart sounds: No murmur heard. Pulmonary:     Effort: Pulmonary effort is normal.     Breath sounds: Normal breath sounds.  Abdominal:     General: Abdomen is flat. Bowel sounds are normal. There is no distension.     Tenderness: There is no abdominal tenderness. There is no guarding.  Skin:    General: Skin is warm and dry.  Neurological:     Mental  Status: He is alert. Mental status is at baseline.  Psychiatric:        Mood and Affect: Mood normal.     Data Reviewed: I have personally reviewed following labs and imaging studies  CBC: Recent Labs  Lab 06/04/24 1648 06/05/24 0315  WBC 15.8* 10.2  NEUTROABS 13.7*  --   HGB 15.0 13.6  HCT 45.2 40.5  MCV 91.5 89.8  PLT 315 270   Basic Metabolic Panel: Recent Labs  Lab 06/04/24 1648 06/04/24 2020 06/05/24 0315  NA 135  --  137  K 4.6  --  4.0  CL 97*  --  102  CO2 26  --  26  GLUCOSE 166*  --  146*  BUN 18  --  17  CREATININE 1.11  --   0.99  CALCIUM  9.5  --  8.7*  MG  --  2.0 2.0  PHOS  --  3.1 2.9   GFR: CrCl cannot be calculated (Unknown ideal weight.). Liver Function Tests: Recent Labs  Lab 06/04/24 1648 06/05/24 0315  AST 23 21  ALT 22 18  ALKPHOS 69 63  BILITOT 0.5 0.5  PROT 7.6 6.5  ALBUMIN 4.5 3.8   No results for input(s): LIPASE, AMYLASE in the last 168 hours. No results for input(s): AMMONIA in the last 168 hours. Coagulation Profile: No results for input(s): INR, PROTIME in the last 168 hours. Cardiac Enzymes: Recent Labs  Lab 06/04/24 1648  CKTOTAL 94   ProBNP, BNP (last 5 results) No results for input(s): PROBNP, BNP in the last 8760 hours. HbA1C: No results for input(s): HGBA1C in the last 72 hours. CBG: Recent Labs  Lab 06/04/24 2018  GLUCAP 148*   Lipid Profile: No results for input(s): CHOL, HDL, LDLCALC, TRIG, CHOLHDL, LDLDIRECT in the last 72 hours. Thyroid  Function Tests: Recent Labs    06/04/24 2020  TSH 2.720  FREET4 1.52   Anemia Panel: No results for input(s): VITAMINB12, FOLATE, FERRITIN, TIBC, IRON, RETICCTPCT in the last 72 hours. Sepsis Labs: Recent Labs  Lab 06/04/24 2020 06/04/24 2243  LATICACIDVEN 1.2 2.0*    No results found for this or any previous visit (from the past 240 hours).   Radiology Studies: CT Head Wo Contrast Result Date: 06/04/2024 CLINICAL DATA:  Head and neck trauma EXAM: CT HEAD WITHOUT CONTRAST CT CERVICAL SPINE WITHOUT CONTRAST TECHNIQUE: Multidetector CT imaging of the head and cervical spine was performed following the standard protocol without intravenous contrast. Multiplanar CT image reconstructions of the cervical spine were also generated. RADIATION DOSE REDUCTION: This exam was performed according to the departmental dose-optimization program which includes automated exposure control, adjustment of the mA and/or kV according to patient size and/or use of iterative reconstruction technique.  COMPARISON:  CT brain 03/16/2023, CT brain and cervical spine 10/02/2022 FINDINGS: CT HEAD FINDINGS Brain: Limited by artifact from patient's left cochlear implant. No gross territorial infarction, hemorrhage or intracranial mass. Atrophy and advanced chronic small vessel ischemic changes of the white matter. Chronic left thalamic lacunar infarct. Stable ventricle size. Vascular: No hyperdense vessels. Vertebral and carotid vascular calcification Skull: No fracture.  Left cochlear implant Sinuses/Orbits: No acute finding. Other: None CT CERVICAL SPINE FINDINGS Alignment: No subluxation.  Facet alignment is normal. Skull base and vertebrae: Chronic superior endplate deformity and mild compression C7, T1 and T2. No acute fracture is seen. Soft tissues and spinal canal: No prevertebral fluid or swelling. No visible canal hematoma. Disc levels: Advanced disc space narrowing C5-C6. Multilevel facet degenerative changes.  No high-grade bony canal stenosis. Chronic fusion of left C2-C3 facets. Moderate severe C1-C2 degenerative change Upper chest: No acute finding Other: None IMPRESSION: 1. Limited evaluation of the brain due to artifact from left cochlear implant. No definite CT evidence for acute intracranial abnormality. Atrophy and chronic small vessel ischemic changes of the white matter. 2. Degenerative changes of the cervical spine. No acute osseous abnormality. Electronically Signed   By: Luke Bun M.D.   On: 06/04/2024 15:30   CT Cervical Spine Wo Contrast Result Date: 06/04/2024 CLINICAL DATA:  Head and neck trauma EXAM: CT HEAD WITHOUT CONTRAST CT CERVICAL SPINE WITHOUT CONTRAST TECHNIQUE: Multidetector CT imaging of the head and cervical spine was performed following the standard protocol without intravenous contrast. Multiplanar CT image reconstructions of the cervical spine were also generated. RADIATION DOSE REDUCTION: This exam was performed according to the departmental dose-optimization program which  includes automated exposure control, adjustment of the mA and/or kV according to patient size and/or use of iterative reconstruction technique. COMPARISON:  CT brain 03/16/2023, CT brain and cervical spine 10/02/2022 FINDINGS: CT HEAD FINDINGS Brain: Limited by artifact from patient's left cochlear implant. No gross territorial infarction, hemorrhage or intracranial mass. Atrophy and advanced chronic small vessel ischemic changes of the white matter. Chronic left thalamic lacunar infarct. Stable ventricle size. Vascular: No hyperdense vessels. Vertebral and carotid vascular calcification Skull: No fracture.  Left cochlear implant Sinuses/Orbits: No acute finding. Other: None CT CERVICAL SPINE FINDINGS Alignment: No subluxation.  Facet alignment is normal. Skull base and vertebrae: Chronic superior endplate deformity and mild compression C7, T1 and T2. No acute fracture is seen. Soft tissues and spinal canal: No prevertebral fluid or swelling. No visible canal hematoma. Disc levels: Advanced disc space narrowing C5-C6. Multilevel facet degenerative changes. No high-grade bony canal stenosis. Chronic fusion of left C2-C3 facets. Moderate severe C1-C2 degenerative change Upper chest: No acute finding Other: None IMPRESSION: 1. Limited evaluation of the brain due to artifact from left cochlear implant. No definite CT evidence for acute intracranial abnormality. Atrophy and chronic small vessel ischemic changes of the white matter. 2. Degenerative changes of the cervical spine. No acute osseous abnormality. Electronically Signed   By: Luke Bun M.D.   On: 06/04/2024 15:30   DG Chest 1 View Result Date: 06/04/2024 EXAM: 1 VIEW(S) XRAY OF THE CHEST 06/04/2024 02:33:00 PM COMPARISON: CT 05/12/2023. CLINICAL HISTORY: [id number] Fall [id number]. FINDINGS: LUNGS AND PLEURA: No focal pulmonary opacity. No pleural effusion. No pneumothorax. HEART AND MEDIASTINUM: Atherosclerotic plaque. BONES AND SOFT TISSUES: Old healed right rib  fractures. IMPRESSION: 1. No acute process. Electronically signed by: Dayne Hassell MD 06/04/2024 03:14 PM EST RP Workstation: [id number]   DG Pelvis 1-2 Views Result Date: 06/04/2024 CLINICAL DATA:  Fall. EXAM: PELVIS - 1-2 VIEW COMPARISON:  None Available. FINDINGS: There is no evidence of pelvic fracture or diastasis. No pelvic bone lesions are seen. Mild osteoarthritis of both hips. Atherosclerosis of bilateral femoral arteries. IMPRESSION: No acute findings. Mild osteoarthritis of both hips. Electronically Signed   By: Marcey Moan M.D.   On: 06/04/2024 15:13    Scheduled Meds:  amiodarone   100 mg Oral Daily   aspirin  EC  325 mg Oral Daily   atorvastatin   10 mg Oral QPM   finasteride   5 mg Oral Daily   levothyroxine   25 mcg Oral QAC breakfast   liothyronine   25 mcg Oral Daily   metoprolol  succinate  12.5 mg Oral Daily   pantoprazole   40 mg Oral Daily  sacubitril -valsartan   1 tablet Oral BID   Continuous Infusions:  cefTRIAXone  (ROCEPHIN )  IV Stopped (06/04/24 2049)     LOS: 0 days   Time spent: 35 minutes  Lonni KANDICE Moose, MD  [facility]  06/05/2024, 7:39 AM   "

## 2024-06-06 ENCOUNTER — Encounter (HOSPITAL_COMMUNITY): Payer: Self-pay | Admitting: Internal Medicine

## 2024-06-06 ENCOUNTER — Other Ambulatory Visit: Payer: Self-pay

## 2024-06-06 LAB — BASIC METABOLIC PANEL WITH GFR
Anion gap: 9 (ref 5–15)
BUN: 17 mg/dL (ref 8–23)
CO2: 26 mmol/L (ref 22–32)
Calcium: 8.8 mg/dL — ABNORMAL LOW (ref 8.9–10.3)
Chloride: 97 mmol/L — ABNORMAL LOW (ref 98–111)
Creatinine, Ser: 1.18 mg/dL (ref 0.61–1.24)
GFR, Estimated: 59 mL/min — ABNORMAL LOW
Glucose, Bld: 151 mg/dL — ABNORMAL HIGH (ref 70–99)
Potassium: 4.1 mmol/L (ref 3.5–5.1)
Sodium: 132 mmol/L — ABNORMAL LOW (ref 135–145)

## 2024-06-06 LAB — CBC WITH DIFFERENTIAL/PLATELET
Abs Immature Granulocytes: 0.07 K/uL (ref 0.00–0.07)
Basophils Absolute: 0 K/uL (ref 0.0–0.1)
Basophils Relative: 0 %
Eosinophils Absolute: 0 K/uL (ref 0.0–0.5)
Eosinophils Relative: 0 %
HCT: 38.2 % — ABNORMAL LOW (ref 39.0–52.0)
Hemoglobin: 13.1 g/dL (ref 13.0–17.0)
Immature Granulocytes: 1 %
Lymphocytes Relative: 11 %
Lymphs Abs: 1.4 K/uL (ref 0.7–4.0)
MCH: 30.5 pg (ref 26.0–34.0)
MCHC: 34.3 g/dL (ref 30.0–36.0)
MCV: 88.8 fL (ref 80.0–100.0)
Monocytes Absolute: 1.2 K/uL — ABNORMAL HIGH (ref 0.1–1.0)
Monocytes Relative: 10 %
Neutro Abs: 10.1 K/uL — ABNORMAL HIGH (ref 1.7–7.7)
Neutrophils Relative %: 78 %
Platelets: 271 K/uL (ref 150–400)
RBC: 4.3 MIL/uL (ref 4.22–5.81)
RDW: 13.9 % (ref 11.5–15.5)
WBC: 12.8 K/uL — ABNORMAL HIGH (ref 4.0–10.5)
nRBC: 0 % (ref 0.0–0.2)

## 2024-06-06 LAB — T3: T3, Total: 75 ng/dL (ref 71–180)

## 2024-06-06 LAB — GLUCOSE, CAPILLARY
Glucose-Capillary: 192 mg/dL — ABNORMAL HIGH (ref 70–99)
Glucose-Capillary: 157 mg/dL — ABNORMAL HIGH (ref 70–99)
Glucose-Capillary: 152 mg/dL — ABNORMAL HIGH (ref 70–99)

## 2024-06-06 LAB — CBG MONITORING, ED: Glucose-Capillary: 142 mg/dL — ABNORMAL HIGH (ref 70–99)

## 2024-06-06 MED ORDER — GLUCERNA SHAKE PO LIQD
237.0000 mL | Freq: Two times a day (BID) | ORAL | Status: DC
  Administered 2024-06-06 – 2024-06-07 (×2): 237 mL via ORAL

## 2024-06-06 MED ORDER — ADULT MULTIVITAMIN W/MINERALS CH
1.0000 | ORAL_TABLET | Freq: Every day | ORAL | Status: DC
  Administered 2024-06-07: 1 via ORAL
  Filled 2024-06-06: qty 1

## 2024-06-06 MED ORDER — MELATONIN 3 MG PO TABS
3.0000 mg | ORAL_TABLET | Freq: Every evening | ORAL | Status: DC | PRN
  Filled 2024-06-06: qty 1

## 2024-06-06 NOTE — Progress Notes (Addendum)
 Initial Nutrition Assessment  DOCUMENTATION CODES:  Severe malnutrition in context of social or environmental circumstances, Underweight  INTERVENTION:  Liberalize diet to regular diet to encourage PO intake. Room service and feeding assistance. Glucerna Shake PO BID. Each supplement provides 220 Kcals and 10 grams of protein. Multivitamin PO once daily.   NUTRITION DIAGNOSIS:  Severe Malnutrition related to social / environmental circumstances as evidenced by severe muscle depletion, severe fat depletion   GOAL:  Patient will meet greater than or equal to 90% of their needs   MONITOR:  PO intake, Supplement acceptance, Labs, Weight trends  REASON FOR ASSESSMENT:  Consult Assessment of nutrition requirement/status  ASSESSMENT:  Patient presented from home with fall and was admitted for syncope and underlying UTI. PMH significant for dementia, DM2, CVA, CHF, HTN, COPD, chronic Foley, cochlear implant for hearing loss, severe malnutrition diagnosed 2023 during COVID admission, and hypothyroidism.  01/06 presented to ED after falling in bathroom, head laceration 01/07 SLP BSE = reg/thin, SLP signed off  Visited the patient with wife Charolette at bedside. The patient is very pleasant and hard of hearing. Only able to discern his responses intermittently. Lunch tray at bedside at least half consumed. The patient states it is a lot of food. Charolette reports that the patient has been eating well PTA and has maintained a UBW of 108-110 lbs. He tends to weigh more using the scales at the doctor's office. The patient eats three well-balanced meals daily. He does not drink ONS at home but he is amenable to them here might as well. Nursing to obtain current weight on patient. D/W RN.  Scheduled Meds:  amiodarone   100 mg Oral Daily   aspirin  EC  325 mg Oral Daily   atorvastatin   10 mg Oral QPM   finasteride   5 mg Oral Daily   levothyroxine   25 mcg Oral QAC breakfast   liothyronine   25 mcg  Oral Daily   metoprolol  succinate  12.5 mg Oral Daily   pantoprazole   40 mg Oral Daily   sacubitril -valsartan   1 tablet Oral BID   Continuous Infusions:  cefTRIAXone  (ROCEPHIN )  IV Stopped (06/05/24 2039)   PRN Meds:.acetaminophen  **OR** acetaminophen , HYDROcodone -acetaminophen , melatonin, ondansetron  **OR** ondansetron  (ZOFRAN ) IV  Diet Order             Diet heart healthy/carb modified Room service appropriate? Yes; Fluid consistency: Thin  Diet effective now                  Meal Intake: 50-75%  Labs:     Latest Ref Rng & Units 06/06/2024    5:50 AM 06/05/2024    3:15 AM 06/04/2024    4:48 PM  CMP  Glucose 70 - 99 mg/dL 848  853  833   BUN 8 - 23 mg/dL 17  17  18    Creatinine 0.61 - 1.24 mg/dL 8.81  9.00  8.88   Sodium 135 - 145 mmol/L 132  137  135   Potassium 3.5 - 5.1 mmol/L 4.1  4.0  4.6   Chloride 98 - 111 mmol/L 97  102  97   CO2 22 - 32 mmol/L 26  26  26    Calcium  8.9 - 10.3 mg/dL 8.8  8.7  9.5   Total Protein 6.5 - 8.1 g/dL  6.5  7.6   Total Bilirubin 0.0 - 1.2 mg/dL  0.5  0.5   Alkaline Phos 38 - 126 U/L  63  69   AST 15 - 41 U/L  21  23   ALT 0 - 44 U/L  18  22      I/O: -1 L since admit  NUTRITION - FOCUSED PHYSICAL EXAM: Flowsheet Row Most Recent Value  Orbital Region Severe depletion  Upper Arm Region Severe depletion  Thoracic and Lumbar Region Severe depletion  Buccal Region Severe depletion  Temple Region Severe depletion  Clavicle Bone Region Severe depletion  Clavicle and Acromion Bone Region Severe depletion  Scapular Bone Region Severe depletion  Dorsal Hand Severe depletion  Patellar Region Severe depletion  Anterior Thigh Region Severe depletion  Posterior Calf Region Severe depletion  Edema (RD Assessment) None  Hair Reviewed  Eyes Reviewed  Mouth Reviewed  Skin Reviewed  Nails Reviewed    EDUCATION NEEDS:  Education needs have been addressed  Skin:  Skin Assessment: Reviewed RN Assessment  Last BM:  1/7 type not  charted  Height:  Ht Readings from Last 1 Encounters:  11/20/23 5' 6.5 (1.689 m)   Weight:  Wt Readings from Last 10 Encounters:  11/20/23 50.8 kg  08/07/23 51 kg  07/19/23 50.3 kg  05/11/23 49.9 kg  01/07/23 49.9 kg  01/06/23 49.9 kg  10/02/22 51.7 kg  02/27/22 53.5 kg  01/27/22 53.6 kg  08/26/21 53.5 kg   Weight Change: awaiting current weight but the patient's wife reports stable weight  Usual Body Weight: 108-110 lbs per patient's wife  Edema: none  Ideal Body Weight:  67.3 kg   BMI:  There is no height or weight on file to calculate BMI.  Estimated Daily Nutritional Needs:  Kcal:  1700-1900 Protein:  90-110 g Fluid:  >/=1700 mL    Leverne Ruth, MS, RDN, LDN [place]. [facility] See AMION for contact information Secure chat preferred

## 2024-06-06 NOTE — ED Notes (Signed)
 Wife at bedside.

## 2024-06-06 NOTE — Progress Notes (Signed)
 The patient's wife asks that we change code status to Full Code.

## 2024-06-06 NOTE — Progress Notes (Signed)
 " PROGRESS NOTE    Raymond Fowler  FMW:[id number] DOB: 11/26/1932 DOA: 06/04/2024 PCP: Raymond Senior, MD  Brief Note:  89 y.o. male with medical history significant of Dm2, HOH status post cochlear implant, HTN, SVT, BPH, anemia, chronic combined systolic diastolic CHF, dementia, dysphagia, hypothyroidism, GERD, cerebrovascular disease   Admitted for   Syncope    Subjective: Patient seen on rounds this morning in the ER room 44C.  He moved up to the floor 6E28C.  In the morning he was asleep as he apparently was quite agitated last night.  He upstairs on the medical floor he was pleasant, very hard of hearing his wife was at his side.  No complaints.SABRA  Hospital Course: 06/06/24: No further syncopal events.  Urine culture is pending.  2D echo was  repeated and improved from last year.   Assessment and Plan:  Syncope Syncope occurred post having a bowel movement -Probable vasovagal event. Although family denies patient constipating or pushing Troponins relatively flat ( 48--> 48--> 36--> 42 ) 2D echo ordered Rehydrate Treat underlying UTI   Type 2 diabetes mellitus with other circulatory complications (HCC) Diet controlled monitor for hypoglycemia   Wears hearing aid in right ear Chronic stable   SVT (supraventricular tachycardia) Continue amiodarone  and Toprol    Subclinical hypothyroidism - Check TSH continue home medications Synthroid  at 25mcg po q day Need to confirm from family what patient is currently taking     Dilated cardiomyopathy (HCC) Currently appears to be slightly on the dry side avoid fluid overload repeat echo   Chronic combined systolic and diastolic congestive heart failure (HCC) Avoid fluid overload   Cardiac murmur Chronic stable   BPH (benign prostatic hyperplasia) Continue Proscar    UTI (urinary tract infection)  - treat with Rocephin      await results of urine culture and adjust antibiotic coverage as needed - History of chronic indwelling Foley  catheter    DVT prophylaxis: SCDs Start: 06/04/24 1935  SCDs   Code Status: Limited: Do not attempt resuscitation (DNR) -DNR-LIMITED -Do Not Intubate/DNI  Family Communication: Discussed with  the wife who is at the bedside Disposition Plan: Home with home health Reason for continuing need for hospitalization: Follow cultures and clinical course  Objective: Vitals:   06/06/24 0300 06/06/24 0400 06/06/24 0500 06/06/24 0600  BP: (!) 152/83 (!) 165/72 (!) 142/86 (!) 155/90  Pulse: 70 68 69 73  Resp: 13 14 17 19   Temp:    97.8 F (36.6 C)  TempSrc:    Oral  SpO2: 98% 98% 99% 98%    Intake/Output Summary (Last 24 hours) at 06/06/2024 0719 Last data filed at 06/06/2024 0644 Gross per 24 hour  Intake --  Output 1225 ml  Net -1225 ml   There were no vitals filed for this visit.  Examination: Dysarthria at baseline Physical Exam HENT:     Head: Normocephalic and atraumatic.     Nose: Nose normal.     Mouth/Throat:     Mouth: Mucous membranes are moist.     Pharynx: Oropharynx is clear.  Eyes:     Extraocular Movements: Extraocular movements intact.     Conjunctiva/sclera: Conjunctivae normal.     Pupils: Pupils are equal, round, and reactive to light.  Cardiovascular:     Rate and Rhythm: Normal rate and regular rhythm.     Heart sounds: No murmur heard. Pulmonary:     Effort: Pulmonary effort is normal.     Breath sounds: Normal breath sounds.  Abdominal:  General: Abdomen is flat. Bowel sounds are normal. There is no distension.     Tenderness: There is no abdominal tenderness. There is no guarding.  Skin:    General: Skin is warm and dry.  Neurological:     Mental Status: He is alert. Mental status is at baseline.  Psychiatric:        Mood and Affect: Mood normal.     Data Reviewed: I have personally reviewed following labs and imaging studies  CBC: Recent Labs  Lab 06/04/24 1648 06/05/24 0315 06/06/24 0550  WBC 15.8* 10.2 12.8*  NEUTROABS 13.7*  --   10.1*  HGB 15.0 13.6 13.1  HCT 45.2 40.5 38.2*  MCV 91.5 89.8 88.8  PLT 315 270 271   Basic Metabolic Panel: Recent Labs  Lab 06/04/24 1648 06/04/24 2020 06/05/24 0315 06/06/24 0550  NA 135  --  137 132*  K 4.6  --  4.0 4.1  CL 97*  --  102 97*  CO2 26  --  26 26  GLUCOSE 166*  --  146* 151*  BUN 18  --  17 17  CREATININE 1.11  --  0.99 1.18  CALCIUM  9.5  --  8.7* 8.8*  MG  --  2.0 2.0  --   PHOS  --  3.1 2.9  --    GFR: CrCl cannot be calculated (Unknown ideal weight.). Liver Function Tests: Recent Labs  Lab 06/04/24 1648 06/05/24 0315  AST 23 21  ALT 22 18  ALKPHOS 69 63  BILITOT 0.5 0.5  PROT 7.6 6.5  ALBUMIN 4.5 3.8   No results for input(s): LIPASE, AMYLASE in the last 168 hours. No results for input(s): AMMONIA in the last 168 hours. Coagulation Profile: No results for input(s): INR, PROTIME in the last 168 hours. Cardiac Enzymes: Recent Labs  Lab 06/04/24 1648  CKTOTAL 94   ProBNP, BNP (last 5 results) No results for input(s): PROBNP, BNP in the last 8760 hours. HbA1C: No results for input(s): HGBA1C in the last 72 hours. CBG: Recent Labs  Lab 06/04/24 2018 06/05/24 0814 06/05/24 1247 06/05/24 2314 06/06/24 0559  GLUCAP 148* 146* 195* 163* 142*   Lipid Profile: No results for input(s): CHOL, HDL, LDLCALC, TRIG, CHOLHDL, LDLDIRECT in the last 72 hours. Thyroid  Function Tests: Recent Labs    06/04/24 2020  TSH 2.720  FREET4 1.52   Anemia Panel: No results for input(s): VITAMINB12, FOLATE, FERRITIN, TIBC, IRON, RETICCTPCT in the last 72 hours. Sepsis Labs: Recent Labs  Lab 06/04/24 2020 06/04/24 2243  LATICACIDVEN 1.2 2.0*    No results found for this or any previous visit (from the past 240 hours).   Radiology Studies: ECHOCARDIOGRAM COMPLETE Result Date: 06/05/2024    ECHOCARDIOGRAM REPORT   Patient Name:   Raymond Fowler Date of Exam: 06/05/2024 Medical Rec #:  [id number]   Height:       66.5  in Accession #:    [id number]  Weight:       112.0 lb Date of Birth:  Oct 05, 1932   BSA:          1.572 m Patient Age:    89 years    BP:           197/127 mmHg Patient Gender: M           HR:           77 bpm. Exam Location:  Inpatient Procedure: 2D Echo, Cardiac Doppler and Color Doppler (Both Spectral and Color  Flow Doppler were utilized during procedure). Indications:    Syncope R55  History:        Patient has prior history of Echocardiogram examinations, most                 recent 02/02/2023. Signs/Symptoms:Murmur.  Sonographer:    Nathanel Devonshire Referring Phys: [id number] ANASTASSIA DOUTOVA  Sonographer Comments: Suboptimal parasternal window, suboptimal apical window and no subcostal window. IMPRESSIONS  1. Left ventricular ejection fraction, by estimation, is 40 to 45%. The left ventricle has mildly decreased function. The left ventricle demonstrates global hypokinesis. The left ventricular internal cavity size was mildly dilated. Left ventricular diastolic parameters are consistent with Grade I diastolic dysfunction (impaired relaxation).  2. Right ventricular systolic function is normal. The right ventricular size is normal.  3. The mitral valve is normal in structure. Trivial mitral valve regurgitation. No evidence of mitral stenosis.  4. The aortic valve was not well visualized. There is mild calcification of the aortic valve. Aortic valve regurgitation is trivial. Aortic valve sclerosis/calcification is present, without any evidence of aortic stenosis. FINDINGS  Left Ventricle: Left ventricular ejection fraction, by estimation, is 40 to 45%. The left ventricle has mildly decreased function. The left ventricle demonstrates global hypokinesis. The left ventricular internal cavity size was mildly dilated. There is  no left ventricular hypertrophy. Left ventricular diastolic parameters are consistent with Grade I diastolic dysfunction (impaired relaxation). Right Ventricle: The right ventricular size is  normal. No increase in right ventricular wall thickness. Right ventricular systolic function is normal. Left Atrium: Left atrial size was normal in size. Right Atrium: Right atrial size was normal in size. Pericardium: There is no evidence of pericardial effusion. Mitral Valve: The mitral valve is normal in structure. Mild mitral annular calcification. Trivial mitral valve regurgitation. No evidence of mitral valve stenosis. Tricuspid Valve: The tricuspid valve is normal in structure. Tricuspid valve regurgitation is trivial. No evidence of tricuspid stenosis. Aortic Valve: The aortic valve was not well visualized. There is mild calcification of the aortic valve. Aortic valve regurgitation is trivial. Aortic regurgitation PHT measures 535 msec. Aortic valve sclerosis/calcification is present, without any evidence of aortic stenosis. Aortic valve mean gradient measures 3.0 mmHg. Aortic valve peak gradient measures 6.2 mmHg. Aortic valve area, by VTI measures 2.11 cm. Pulmonic Valve: The pulmonic valve was normal in structure. Pulmonic valve regurgitation is not visualized. No evidence of pulmonic stenosis. Aorta: The aortic root is normal in size and structure. Venous: The inferior vena cava was not well visualized. IAS/Shunts: No atrial level shunt detected by color flow Doppler.  LEFT VENTRICLE PLAX 2D LVIDd:         4.80 cm     Diastology LVIDs:         3.80 cm     LV e' medial:    4.35 cm/s LV PW:         1.00 cm     LV E/e' medial:  12.9 LV IVS:        0.80 cm     LV e' lateral:   6.85 cm/s LVOT diam:     1.90 cm     LV E/e' lateral: 8.2 LV SV:         51 LV SV Index:   32 LVOT Area:     2.84 cm LV IVRT:       130 msec  LV Volumes (MOD) LV vol d, MOD A2C: 77.3 ml LV vol d, MOD A4C: 97.4 ml LV vol  s, MOD A2C: 48.0 ml LV vol s, MOD A4C: 57.6 ml LV SV MOD A2C:     29.3 ml LV SV MOD A4C:     97.4 ml LV SV MOD BP:      38.9 ml RIGHT VENTRICLE RV Basal diam:  3.40 cm LEFT ATRIUM           Index LA diam:      3.20 cm  2.04 cm/m LA Vol (A4C): 27.7 ml 17.62 ml/m  AORTIC VALVE AV Area (Vmax):    1.79 cm AV Area (Vmean):   1.52 cm AV Area (VTI):     2.11 cm AV Vmax:           125.00 cm/s AV Vmean:          85.000 cm/s AV VTI:            0.241 m AV Peak Grad:      6.2 mmHg AV Mean Grad:      3.0 mmHg LVOT Vmax:         79.00 cm/s LVOT Vmean:        45.600 cm/s LVOT VTI:          0.179 m LVOT/AV VTI ratio: 0.74 AI PHT:            535 msec  AORTA Ao Root diam: 3.30 cm Ao Asc diam:  3.50 cm MITRAL VALVE MV Area (PHT): 6.43 cm    SHUNTS MV Decel Time: 118 msec    Systemic VTI:  0.18 m MV E velocity: 56.10 cm/s  Systemic Diam: 1.90 cm MV A velocity: 87.80 cm/s MV E/A ratio:  0.64 Toribio Fuel MD Electronically signed by Toribio Fuel MD Signature Date/Time: 06/05/2024/9:57:59 PM    Final    CT Head Wo Contrast Result Date: 06/04/2024 CLINICAL DATA:  Head and neck trauma EXAM: CT HEAD WITHOUT CONTRAST CT CERVICAL SPINE WITHOUT CONTRAST TECHNIQUE: Multidetector CT imaging of the head and cervical spine was performed following the standard protocol without intravenous contrast. Multiplanar CT image reconstructions of the cervical spine were also generated. RADIATION DOSE REDUCTION: This exam was performed according to the departmental dose-optimization program which includes automated exposure control, adjustment of the mA and/or kV according to patient size and/or use of iterative reconstruction technique. COMPARISON:  CT brain 03/16/2023, CT brain and cervical spine 10/02/2022 FINDINGS: CT HEAD FINDINGS Brain: Limited by artifact from patient's left cochlear implant. No gross territorial infarction, hemorrhage or intracranial mass. Atrophy and advanced chronic small vessel ischemic changes of the white matter. Chronic left thalamic lacunar infarct. Stable ventricle size. Vascular: No hyperdense vessels. Vertebral and carotid vascular calcification Skull: No fracture.  Left cochlear implant Sinuses/Orbits: No acute finding.  Other: None CT CERVICAL SPINE FINDINGS Alignment: No subluxation.  Facet alignment is normal. Skull base and vertebrae: Chronic superior endplate deformity and mild compression C7, T1 and T2. No acute fracture is seen. Soft tissues and spinal canal: No prevertebral fluid or swelling. No visible canal hematoma. Disc levels: Advanced disc space narrowing C5-C6. Multilevel facet degenerative changes. No high-grade bony canal stenosis. Chronic fusion of left C2-C3 facets. Moderate severe C1-C2 degenerative change Upper chest: No acute finding Other: None IMPRESSION: 1. Limited evaluation of the brain due to artifact from left cochlear implant. No definite CT evidence for acute intracranial abnormality. Atrophy and chronic small vessel ischemic changes of the white matter. 2. Degenerative changes of the cervical spine. No acute osseous abnormality. Electronically Signed   By: Luke Scott HERO.D.  On: 06/04/2024 15:30   CT Cervical Spine Wo Contrast Result Date: 06/04/2024 CLINICAL DATA:  Head and neck trauma EXAM: CT HEAD WITHOUT CONTRAST CT CERVICAL SPINE WITHOUT CONTRAST TECHNIQUE: Multidetector CT imaging of the head and cervical spine was performed following the standard protocol without intravenous contrast. Multiplanar CT image reconstructions of the cervical spine were also generated. RADIATION DOSE REDUCTION: This exam was performed according to the departmental dose-optimization program which includes automated exposure control, adjustment of the mA and/or kV according to patient size and/or use of iterative reconstruction technique. COMPARISON:  CT brain 03/16/2023, CT brain and cervical spine 10/02/2022 FINDINGS: CT HEAD FINDINGS Brain: Limited by artifact from patient's left cochlear implant. No gross territorial infarction, hemorrhage or intracranial mass. Atrophy and advanced chronic small vessel ischemic changes of the white matter. Chronic left thalamic lacunar infarct. Stable ventricle size. Vascular:  No hyperdense vessels. Vertebral and carotid vascular calcification Skull: No fracture.  Left cochlear implant Sinuses/Orbits: No acute finding. Other: None CT CERVICAL SPINE FINDINGS Alignment: No subluxation.  Facet alignment is normal. Skull base and vertebrae: Chronic superior endplate deformity and mild compression C7, T1 and T2. No acute fracture is seen. Soft tissues and spinal canal: No prevertebral fluid or swelling. No visible canal hematoma. Disc levels: Advanced disc space narrowing C5-C6. Multilevel facet degenerative changes. No high-grade bony canal stenosis. Chronic fusion of left C2-C3 facets. Moderate severe C1-C2 degenerative change Upper chest: No acute finding Other: None IMPRESSION: 1. Limited evaluation of the brain due to artifact from left cochlear implant. No definite CT evidence for acute intracranial abnormality. Atrophy and chronic small vessel ischemic changes of the white matter. 2. Degenerative changes of the cervical spine. No acute osseous abnormality. Electronically Signed   By: Luke Bun M.D.   On: 06/04/2024 15:30   DG Chest 1 View Result Date: 06/04/2024 EXAM: 1 VIEW(S) XRAY OF THE CHEST 06/04/2024 02:33:00 PM COMPARISON: CT 05/12/2023. CLINICAL HISTORY: 809823 Fall [id number]. FINDINGS: LUNGS AND PLEURA: No focal pulmonary opacity. No pleural effusion. No pneumothorax. HEART AND MEDIASTINUM: Atherosclerotic plaque. BONES AND SOFT TISSUES: Old healed right rib fractures. IMPRESSION: 1. No acute process. Electronically signed by: Dayne Hassell MD 06/04/2024 03:14 PM EST RP Workstation: [id number]   DG Pelvis 1-2 Views Result Date: 06/04/2024 CLINICAL DATA:  Fall. EXAM: PELVIS - 1-2 VIEW COMPARISON:  None Available. FINDINGS: There is no evidence of pelvic fracture or diastasis. No pelvic bone lesions are seen. Mild osteoarthritis of both hips. Atherosclerosis of bilateral femoral arteries. IMPRESSION: No acute findings. Mild osteoarthritis of both hips. Electronically Signed    By: Marcey Moan M.D.   On: 06/04/2024 15:13    Scheduled Meds:  amiodarone   100 mg Oral Daily   aspirin  EC  325 mg Oral Daily   atorvastatin   10 mg Oral QPM   finasteride   5 mg Oral Daily   levothyroxine   25 mcg Oral QAC breakfast   liothyronine   25 mcg Oral Daily   metoprolol  succinate  12.5 mg Oral Daily   pantoprazole   40 mg Oral Daily   sacubitril -valsartan   1 tablet Oral BID   Continuous Infusions:  cefTRIAXone  (ROCEPHIN )  IV Stopped (06/05/24 2039)     LOS: 0 days   Time spent:  minutes  Lonni KANDICE Moose, MD  [facility]  06/06/2024, 7:19 AM   "

## 2024-06-07 ENCOUNTER — Other Ambulatory Visit (HOSPITAL_COMMUNITY): Payer: Self-pay

## 2024-06-07 LAB — GLUCOSE, CAPILLARY
Glucose-Capillary: 149 mg/dL — ABNORMAL HIGH (ref 70–99)
Glucose-Capillary: 149 mg/dL — ABNORMAL HIGH (ref 70–99)
Glucose-Capillary: 171 mg/dL — ABNORMAL HIGH (ref 70–99)
Glucose-Capillary: 225 mg/dL — ABNORMAL HIGH (ref 70–99)

## 2024-06-07 MED ORDER — ORAL CARE MOUTH RINSE: 15.0000 mL | OROMUCOSAL | Status: DC | PRN

## 2024-06-07 MED ORDER — SULFAMETHOXAZOLE-TRIMETHOPRIM 800-160 MG PO TABS
1.0000 | ORAL_TABLET | Freq: Two times a day (BID) | ORAL | 0 refills | Status: AC
  Filled 2024-06-07: qty 10, 5d supply, fill #0

## 2024-06-07 MED ORDER — ACETAMINOPHEN 325 MG PO TABS: 650.0000 mg | ORAL_TABLET | Freq: Four times a day (QID) | ORAL | Status: AC | PRN

## 2024-06-07 MED ORDER — SULFAMETHOXAZOLE-TRIMETHOPRIM 800-160 MG PO TABS
1.0000 | ORAL_TABLET | Freq: Two times a day (BID) | ORAL | Status: DC
  Filled 2024-06-07 (×2): qty 1

## 2024-06-07 NOTE — Progress Notes (Signed)
 DISCHARGE NOTE HOME Coda Mathey to be discharged Home per MD order. Discussed prescriptions and follow up appointments with the patient. Prescriptions given to patient; medication list explained in detail. Patient verbalized understanding.  Skin clean, dry and intact without evidence of skin break down, no evidence of skin tears noted. IV catheter discontinued intact. Site without signs and symptoms of complications. Dressing and pressure applied. Pt denies pain at the site currently. No complaints noted.  See Lda for wound head at dischargePatient free of lines, drains, and wounds.   An After Visit Summary (AVS) was printed and given to the patient. Patient escorted via wheelchair, and discharged home via private auto.  Peyton SHAUNNA Pepper, RN

## 2024-06-07 NOTE — Progress Notes (Signed)
 " PROGRESS NOTE    Raymond Fowler  FMW:[id number] DOB: 1932-06-04 DOA: 06/04/2024 PCP: Nanci Senior, MD  Brief Note:  89 y.o. male with medical history significant of Dm2, HOH status post cochlear implant, HTN, SVT, BPH, anemia, chronic combined systolic diastolic CHF, dementia, dysphagia, hypothyroidism, GERD, cerebrovascular disease   Admitted for   Syncope    Subjective: Patient feels well.  Wife at the bedside.  Patient denies any dizziness shortness of breath chest pain abdominal pain.  Urinating okay.  Ate breakfast.  Ate lunch.  Urine cultures returned with Providencia, sensitive to ceftriaxone  and Bactrim .  Review of labs, as he is a diabetic, I do not see evidence of a type IV RTA.  Hospital Course: 06/06/24: No further syncopal events.  Urine culture is pending.  2D echo was  repeated and improved from last year. 06/07/24: No further syncopal events.  Eating well.  Urine culture with Providencia rettgeri.  Sensitive to Bactrim  and ceftriaxone .  Initial course with Bactrim  DS 1 p.o. twice daily x 5 days   Assessment and Plan:  Syncope Syncope occurred post having a bowel movement -Probable vasovagal event. Although family denies patient constipating or pushing Troponins relatively flat ( 48--> 48--> 36--> 42 ) 2D echo is improved compared to last echo 02/02/2023. Rehydrate Treat underlying UTI - Suspect this was due to a combination of decreased volume status from poor p.o. intake and a vagal component.  Discussed with his wife.  2D echocardiogram is improved from last echo in 2024.  Follow-up with cardiology consider outpatient loop monitor.   Type 2 diabetes mellitus with other circulatory complications (HCC) Diet controlled monitor for hypoglycemia   Wears hearing aid in right ear Chronic stable   SVT (supraventricular tachycardia) Continue amiodarone  and Toprol    Subclinical hypothyroidism - Check TSH continue home medications Synthroid  at 25mcg po q day Need to confirm  from family what patient is currently taking     Dilated cardiomyopathy (HCC) Currently appears to be slightly on the dry side avoid fluid overload repeat echo 2D echo shows EF 40 to 45%   Chronic combined systolic and diastolic congestive heart failure (HCC) Avoid fluid overload -At time of discharge he is euvolemic.   Cardiac murmur Chronic stable   BPH (benign prostatic hyperplasia) Continue Proscar    UTI (urinary tract infection)  - treat with Rocephin      await results of urine culture and adjust antibiotic coverage as needed - History of chronic indwelling Foley catheter - Providencia rettgeri.  On culture.  Sensitive to ceftriaxone  and Bactrim .  Discharged home on p.o. Bactrim .  Follow-up with PCP next week.  Check BMP.  Changed Foley at the time of discharge.  Discussed in detail follow-up plans with his wife.    DVT prophylaxis: SCDs Start: 06/04/24 1935  SCDs   Code Status: Full Code Family Communication: Discussed with  the wife who is at the bedside Disposition Plan: Home with home health Reason for continuing need for hospitalization: Stable for discharge.  Objective: Vitals:   06/07/24 0012 06/07/24 0418 06/07/24 0739 06/07/24 1151  BP: (!) 155/76  137/86 137/83  Pulse:   67 82  Resp:  20 15 15   Temp: (!) 97.5 F (36.4 C) 97.6 F (36.4 C) 97.6 F (36.4 C) 98.1 F (36.7 C)  TempSrc: Oral Axillary Axillary Oral  SpO2:   94% 93%    Intake/Output Summary (Last 24 hours) at 06/07/2024 1355 Last data filed at 06/07/2024 0400 Gross per 24 hour  Intake 360  ml  Output 1385 ml  Net -1025 ml   There were no vitals filed for this visit.  Examination: Dysarthria at baseline Physical Exam HENT:     Head: Normocephalic and atraumatic.     Nose: Nose normal.     Mouth/Throat:     Mouth: Mucous membranes are moist.     Pharynx: Oropharynx is clear.  Eyes:     Extraocular Movements: Extraocular movements intact.     Conjunctiva/sclera: Conjunctivae normal.      Pupils: Pupils are equal, round, and reactive to light.  Cardiovascular:     Rate and Rhythm: Normal rate and regular rhythm.     Heart sounds: No murmur heard. Pulmonary:     Effort: Pulmonary effort is normal.     Breath sounds: Normal breath sounds.  Abdominal:     General: Abdomen is flat. Bowel sounds are normal. There is no distension.     Tenderness: There is no abdominal tenderness. There is no guarding.  Skin:    General: Skin is warm and dry.  Neurological:     Mental Status: He is alert. Mental status is at baseline.  Psychiatric:        Mood and Affect: Mood normal.     Data Reviewed: I have personally reviewed following labs and imaging studies  CBC: Recent Labs  Lab 06/04/24 1648 06/05/24 0315 06/06/24 0550  WBC 15.8* 10.2 12.8*  NEUTROABS 13.7*  --  10.1*  HGB 15.0 13.6 13.1  HCT 45.2 40.5 38.2*  MCV 91.5 89.8 88.8  PLT 315 270 271   Basic Metabolic Panel: Recent Labs  Lab 06/04/24 1648 06/04/24 2020 06/05/24 0315 06/06/24 0550  NA 135  --  137 132*  K 4.6  --  4.0 4.1  CL 97*  --  102 97*  CO2 26  --  26 26  GLUCOSE 166*  --  146* 151*  BUN 18  --  17 17  CREATININE 1.11  --  0.99 1.18  CALCIUM  9.5  --  8.7* 8.8*  MG  --  2.0 2.0  --   PHOS  --  3.1 2.9  --    GFR: CrCl cannot be calculated (Unknown ideal weight.). Liver Function Tests: Recent Labs  Lab 06/04/24 1648 06/05/24 0315  AST 23 21  ALT 22 18  ALKPHOS 69 63  BILITOT 0.5 0.5  PROT 7.6 6.5  ALBUMIN 4.5 3.8   No results for input(s): LIPASE, AMYLASE in the last 168 hours. No results for input(s): AMMONIA in the last 168 hours. Coagulation Profile: No results for input(s): INR, PROTIME in the last 168 hours. Cardiac Enzymes: Recent Labs  Lab 06/04/24 1648  CKTOTAL 94   ProBNP, BNP (last 5 results) No results for input(s): PROBNP, BNP in the last 8760 hours. HbA1C: No results for input(s): HGBA1C in the last 72 hours. CBG: Recent Labs  Lab  06/06/24 2033 06/07/24 0000 06/07/24 0417 06/07/24 0743 06/07/24 1206  GLUCAP 152* 149* 171* 149* 225*   Lipid Profile: No results for input(s): CHOL, HDL, LDLCALC, TRIG, CHOLHDL, LDLDIRECT in the last 72 hours. Thyroid  Function Tests: Recent Labs    06/04/24 2020  TSH 2.720  FREET4 1.52   Anemia Panel: No results for input(s): VITAMINB12, FOLATE, FERRITIN, TIBC, IRON, RETICCTPCT in the last 72 hours. Sepsis Labs: Recent Labs  Lab 06/04/24 2020 06/04/24 2243  LATICACIDVEN 1.2 2.0*    Recent Results (from the past 240 hours)  Urine Culture  Status: Abnormal (Preliminary result)   Collection Time: 06/04/24  7:04 PM   Specimen: Urine, Clean Catch  Result Value Ref Range Status   Specimen Description URINE, CLEAN CATCH  Final   Special Requests NONE  Final   Culture (A)  Final    >=100,000 COLONIES/mL PROVIDENCIA RETTGERI 80,000 COLONIES/mL CITROBACTER FREUNDII SUSCEPTIBILITIES TO FOLLOW Performed at [facility], [address], [place], [place] [address]    Report Status PENDING  Incomplete   Organism ID, Bacteria PROVIDENCIA RETTGERI (A)  Final      Susceptibility   Providencia rettgeri - MIC*    AMPICILLIN >=32 RESISTANT Resistant     CEFEPIME 4 INTERMEDIATE Intermediate     ERTAPENEM <=0.12 SENSITIVE Sensitive     CEFTRIAXONE  0.5 SENSITIVE Sensitive     GENTAMICIN <=1 SENSITIVE Sensitive     NITROFURANTOIN 256 RESISTANT Resistant     TRIMETH /SULFA  <=20 SENSITIVE Sensitive     AMPICILLIN/SULBACTAM >=32 RESISTANT Resistant     PIP/TAZO Value in next row Resistant      >=128 RESISTANTThis is a modified FDA-approved test that has been validated and its performance characteristics determined by the reporting laboratory.  This laboratory is certified under the Clinical Laboratory Improvement Amendments CLIA as qualified to perform high complexity clinical laboratory testing.    MEROPENEM Value in next row Sensitive      >=128  RESISTANTThis is a modified FDA-approved test that has been validated and its performance characteristics determined by the reporting laboratory.  This laboratory is certified under the Clinical Laboratory Improvement Amendments CLIA as qualified to perform high complexity clinical laboratory testing.    * >=100,000 COLONIES/mL PROVIDENCIA RETTGERI     Radiology Studies: No results found.   Scheduled Meds:  amiodarone   100 mg Oral Daily   aspirin  EC  325 mg Oral Daily   atorvastatin   10 mg Oral QPM   feeding supplement (GLUCERNA SHAKE)  237 mL Oral BID BM   finasteride   5 mg Oral Daily   levothyroxine   25 mcg Oral QAC breakfast   liothyronine   25 mcg Oral Daily   metoprolol  succinate  12.5 mg Oral Daily   multivitamin with minerals  1 tablet Oral Daily   pantoprazole   40 mg Oral Daily   sacubitril -valsartan   1 tablet Oral BID   sulfamethoxazole -trimethoprim   1 tablet Oral Q12H   Continuous Infusions:  cefTRIAXone  (ROCEPHIN )  IV 1 g (06/06/24 2105)     LOS: 1 day   Time spent: 35  minutes  Lonni KANDICE Moose, MD  [facility]  06/07/2024, 1:55 PM   "

## 2024-06-07 NOTE — Discharge Summary (Signed)
 " Physician Discharge Summary   Patient: Raymond Fowler MRN: [id number] DOB: [birth year]  Admit date:     06/04/2024  Discharge date: 06/07/2024  Discharge Physician: Lonni KANDICE Moose   PCP: Nanci Senior, MD   Recommendations at discharge:   Follow-up with PCP early next week for recheck BMP Follow-up with cardiology in the next few weeks to consider loop monitor  Discharge Diagnoses: Principal Problem:   Syncope Active Problems:   Wears hearing aid in right ear   Type 2 diabetes mellitus with other circulatory complications (HCC)   Cardiac murmur   SVT (supraventricular tachycardia)   BPH (benign prostatic hyperplasia)   Subclinical hypothyroidism   Dilated cardiomyopathy (HCC)   Chronic combined systolic and diastolic congestive heart failure (HCC)   UTI (urinary tract infection) due to urinary indwelling Foley catheter  Resolved Problems:   * No resolved hospital problems. * Brief Narrative:  89 y.o. male with medical history significant of Dm2, HOH status post cochlear implant, HTN, SVT, BPH, anemia, chronic combined systolic diastolic CHF, dementia, dysphagia, hypothyroidism, GERD, cerebrovascular disease   Admitted for   Syncope     Subjective: Patient feels well.  Wife at the bedside.  Patient denies any dizziness shortness of breath chest pain abdominal pain.  Urinating okay.  Ate breakfast.  Ate lunch.  Urine cultures returned with Providencia, sensitive to ceftriaxone  and Bactrim .  Review of labs, as he is a diabetic, I do not see evidence of a type IV RTA.  Hospital Course: 06/06/24: No further syncopal events.  Urine culture is pending.  2D echo was  repeated and improved from last year. 06/07/24: No further syncopal events.  Eating well.  Urine culture with Providencia rettgeri.  Sensitive to Bactrim  and ceftriaxone .  Initial course with Bactrim  DS 1 p.o. twice daily x 5 days   Assessment and Plan:  Syncope Syncope occurred post having a bowel movement -Probable  vasovagal event. Although family denies patient constipating or pushing Troponins relatively flat ( 48--> 48--> 36--> 42 ) 2D echo is improved compared to last echo 02/02/2023. Rehydrate Treat underlying UTI - Suspect this was due to a combination of decreased volume status from poor p.o. intake and a vagal component.  Discussed with his wife.  2D echocardiogram is improved from last echo in 2024.  Follow-up with cardiology consider outpatient loop monitor.   Type 2 diabetes mellitus with other circulatory complications (HCC) Diet controlled monitor for hypoglycemia   Wears hearing aid in right ear Chronic stable   SVT (supraventricular tachycardia) Continue amiodarone  and Toprol    Subclinical hypothyroidism - Check TSH continue home medications Synthroid  at 25mcg po q day Need to confirm from family what patient is currently taking     Dilated cardiomyopathy (HCC) Currently appears to be slightly on the dry side avoid fluid overload repeat echo 2D echo shows EF 40 to 45%   Chronic combined systolic and diastolic congestive heart failure (HCC) Avoid fluid overload -At time of discharge he is euvolemic.   Cardiac murmur Chronic stable   BPH (benign prostatic hyperplasia) Continue Proscar    UTI (urinary tract infection)  - treat with Rocephin      await results of urine culture and adjust antibiotic coverage as needed - History of chronic indwelling Foley catheter - Providencia rettgeri.  On culture.  Sensitive to ceftriaxone  and Bactrim .  Discharged home on p.o. Bactrim .  Follow-up with PCP next week.  Check BMP.  Changed Foley at the time of discharge.  Discussed in detail follow-up plans  with his wife.       DVT prophylaxis: SCDs Start: 06/04/24 1935   SCDs   Code Status: Full Code Family Communication: Discussed with  the wife who is at the bedside Disposition Plan: Home with home health Reason for continuing need for hospitalization: Stable for discharge.         DISCHARGE MEDICATION: Allergies as of 06/07/2024       Reactions   Chlorhexidine          Medication List     STOP taking these medications    Fish Oil 1200 MG Caps   pantoprazole  40 MG tablet Commonly known as: PROTONIX        TAKE these medications    acetaminophen  325 MG tablet Commonly known as: TYLENOL  Take 2 tablets (650 mg total) by mouth every 6 (six) hours as needed for mild pain (pain score 1-3) (or Fever >/= 101).   alendronate 70 MG tablet Commonly known as: FOSAMAX Take 70 mg by mouth once a week.   amiodarone  100 MG tablet Commonly known as: PACERONE  TAKE 1 TABLET BY MOUTH DAILY   aspirin  EC 325 MG tablet Take 325 mg by mouth daily.   atorvastatin  10 MG tablet Commonly known as: LIPITOR Take 1 tablet (10 mg total) by mouth every evening.   brimonidine  0.2 % ophthalmic solution Commonly known as: ALPHAGAN  Place 1 drop into both eyes 2 (two) times daily.   carboxymethylcellul-glycerin 0.5-0.9 % ophthalmic solution Commonly known as: REFRESH OPTIVE Place 1 drop into both eyes 2 (two) times daily.   finasteride  5 MG tablet Commonly known as: PROSCAR  Take 5 mg by mouth daily.   Gemtesa 75 MG Tabs Generic drug: Vibegron Take 75 mg by mouth daily.   levothyroxine  50 MCG tablet Commonly known as: SYNTHROID  Take 50 mcg by mouth daily before breakfast.   liothyronine  25 MCG tablet Commonly known as: CYTOMEL  Take 25 mcg by mouth daily.   metoprolol  succinate 25 MG 24 hr tablet Commonly known as: TOPROL -XL Take 0.5 tablets (12.5 mg total) by mouth daily. Take with or immediately following a meal.   multivitamin with minerals tablet Take 1 tablet by mouth daily.   polyethylene glycol 17 g packet Commonly known as: MiraLax  Take 17 g by mouth daily.   Restasis  0.05 % ophthalmic emulsion Generic drug: cycloSPORINE  Place 1 drop into both eyes 2 (two) times daily.   sacubitril -valsartan  24-26 MG Commonly known as: Entresto  Take 1  tablet by mouth 2 (two) times daily.   sulfamethoxazole -trimethoprim  800-160 MG tablet Commonly known as: BACTRIM  DS Take 1 tablet by mouth every 12 (twelve) hours for 5 days.        Discharge Exam: There were no vitals filed for this visit. Awake, alert, smiling, hard of hearing, cachectic/muscle wasted HEENT: PERRL, EOMI, no scleral icterus, oropharynx moist and pink CV: Regular rate and rhythm S1-S2 no murmurs rubs gallops Lungs: Clear throughout with good air entry Abdomen: Soft, nontender nondistended bowel sounds present Foley catheter: Intact no evidence of blood or discharge at the meatus, slightly cloudy urine in the bag prior to exchange Extremities: Muscle wasted no edema  Condition at discharge: stable  The results of significant diagnostics from this hospitalization (including imaging, microbiology, ancillary and laboratory) are listed below for reference.   Imaging Studies: ECHOCARDIOGRAM COMPLETE Result Date: 06/05/2024    ECHOCARDIOGRAM REPORT   Patient Name:   SINCLAIR ALLIGOOD Date of Exam: 06/05/2024 Medical Rec #:  [id number]   Height:  66.5 in Accession #:    [id number]  Weight:       112.0 lb Date of Birth:  [birth year]   BSA:          1.572 m Patient Age:    89 years    BP:           197/127 mmHg Patient Gender: M           HR:           77 bpm. Exam Location:  Inpatient Procedure: 2D Echo, Cardiac Doppler and Color Doppler (Both Spectral and Color            Flow Doppler were utilized during procedure). Indications:    Syncope R55  History:        Patient has prior history of Echocardiogram examinations, most                 recent 02/02/2023. Signs/Symptoms:Murmur.  Sonographer:    Nathanel Devonshire Referring Phys: [id number] ANASTASSIA DOUTOVA  Sonographer Comments: Suboptimal parasternal window, suboptimal apical window and no subcostal window. IMPRESSIONS  1. Left ventricular ejection fraction, by estimation, is 40 to 45%. The left ventricle has mildly decreased function. The left  ventricle demonstrates global hypokinesis. The left ventricular internal cavity size was mildly dilated. Left ventricular diastolic parameters are consistent with Grade I diastolic dysfunction (impaired relaxation).  2. Right ventricular systolic function is normal. The right ventricular size is normal.  3. The mitral valve is normal in structure. Trivial mitral valve regurgitation. No evidence of mitral stenosis.  4. The aortic valve was not well visualized. There is mild calcification of the aortic valve. Aortic valve regurgitation is trivial. Aortic valve sclerosis/calcification is present, without any evidence of aortic stenosis. FINDINGS  Left Ventricle: Left ventricular ejection fraction, by estimation, is 40 to 45%. The left ventricle has mildly decreased function. The left ventricle demonstrates global hypokinesis. The left ventricular internal cavity size was mildly dilated. There is  no left ventricular hypertrophy. Left ventricular diastolic parameters are consistent with Grade I diastolic dysfunction (impaired relaxation). Right Ventricle: The right ventricular size is normal. No increase in right ventricular wall thickness. Right ventricular systolic function is normal. Left Atrium: Left atrial size was normal in size. Right Atrium: Right atrial size was normal in size. Pericardium: There is no evidence of pericardial effusion. Mitral Valve: The mitral valve is normal in structure. Mild mitral annular calcification. Trivial mitral valve regurgitation. No evidence of mitral valve stenosis. Tricuspid Valve: The tricuspid valve is normal in structure. Tricuspid valve regurgitation is trivial. No evidence of tricuspid stenosis. Aortic Valve: The aortic valve was not well visualized. There is mild calcification of the aortic valve. Aortic valve regurgitation is trivial. Aortic regurgitation PHT measures 535 msec. Aortic valve sclerosis/calcification is present, without any evidence of aortic stenosis. Aortic  valve mean gradient measures 3.0 mmHg. Aortic valve peak gradient measures 6.2 mmHg. Aortic valve area, by VTI measures 2.11 cm. Pulmonic Valve: The pulmonic valve was normal in structure. Pulmonic valve regurgitation is not visualized. No evidence of pulmonic stenosis. Aorta: The aortic root is normal in size and structure. Venous: The inferior vena cava was not well visualized. IAS/Shunts: No atrial level shunt detected by color flow Doppler.  LEFT VENTRICLE PLAX 2D LVIDd:         4.80 cm     Diastology LVIDs:         3.80 cm     LV e' medial:    4.35 cm/s  LV PW:         1.00 cm     LV E/e' medial:  12.9 LV IVS:        0.80 cm     LV e' lateral:   6.85 cm/s LVOT diam:     1.90 cm     LV E/e' lateral: 8.2 LV SV:         51 LV SV Index:   32 LVOT Area:     2.84 cm LV IVRT:       130 msec  LV Volumes (MOD) LV vol d, MOD A2C: 77.3 ml LV vol d, MOD A4C: 97.4 ml LV vol s, MOD A2C: 48.0 ml LV vol s, MOD A4C: 57.6 ml LV SV MOD A2C:     29.3 ml LV SV MOD A4C:     97.4 ml LV SV MOD BP:      38.9 ml RIGHT VENTRICLE RV Basal diam:  3.40 cm LEFT ATRIUM           Index LA diam:      3.20 cm 2.04 cm/m LA Vol (A4C): 27.7 ml 17.62 ml/m  AORTIC VALVE AV Area (Vmax):    1.79 cm AV Area (Vmean):   1.52 cm AV Area (VTI):     2.11 cm AV Vmax:           125.00 cm/s AV Vmean:          85.000 cm/s AV VTI:            0.241 m AV Peak Grad:      6.2 mmHg AV Mean Grad:      3.0 mmHg LVOT Vmax:         79.00 cm/s LVOT Vmean:        45.600 cm/s LVOT VTI:          0.179 m LVOT/AV VTI ratio: 0.74 AI PHT:            535 msec  AORTA Ao Root diam: 3.30 cm Ao Asc diam:  3.50 cm MITRAL VALVE MV Area (PHT): 6.43 cm    SHUNTS MV Decel Time: 118 msec    Systemic VTI:  0.18 m MV E velocity: 56.10 cm/s  Systemic Diam: 1.90 cm MV A velocity: 87.80 cm/s MV E/A ratio:  0.64 Toribio Fuel MD Electronically signed by Toribio Fuel MD Signature Date/Time: 06/05/2024/9:57:59 PM    Final    CT Head Wo Contrast Result Date: 06/04/2024 CLINICAL DATA:   Head and neck trauma EXAM: CT HEAD WITHOUT CONTRAST CT CERVICAL SPINE WITHOUT CONTRAST TECHNIQUE: Multidetector CT imaging of the head and cervical spine was performed following the standard protocol without intravenous contrast. Multiplanar CT image reconstructions of the cervical spine were also generated. RADIATION DOSE REDUCTION: This exam was performed according to the departmental dose-optimization program which includes automated exposure control, adjustment of the mA and/or kV according to patient size and/or use of iterative reconstruction technique. COMPARISON:  CT brain 03/16/2023, CT brain and cervical spine 10/02/2022 FINDINGS: CT HEAD FINDINGS Brain: Limited by artifact from patient's left cochlear implant. No gross territorial infarction, hemorrhage or intracranial mass. Atrophy and advanced chronic small vessel ischemic changes of the white matter. Chronic left thalamic lacunar infarct. Stable ventricle size. Vascular: No hyperdense vessels. Vertebral and carotid vascular calcification Skull: No fracture.  Left cochlear implant Sinuses/Orbits: No acute finding. Other: None CT CERVICAL SPINE FINDINGS Alignment: No subluxation.  Facet alignment is normal. Skull base and vertebrae: Chronic superior endplate deformity  and mild compression C7, T1 and T2. No acute fracture is seen. Soft tissues and spinal canal: No prevertebral fluid or swelling. No visible canal hematoma. Disc levels: Advanced disc space narrowing C5-C6. Multilevel facet degenerative changes. No high-grade bony canal stenosis. Chronic fusion of left C2-C3 facets. Moderate severe C1-C2 degenerative change Upper chest: No acute finding Other: None IMPRESSION: 1. Limited evaluation of the brain due to artifact from left cochlear implant. No definite CT evidence for acute intracranial abnormality. Atrophy and chronic small vessel ischemic changes of the white matter. 2. Degenerative changes of the cervical spine. No acute osseous abnormality.  Electronically Signed   By: Luke Bun M.D.   On: 06/04/2024 15:30   CT Cervical Spine Wo Contrast Result Date: 06/04/2024 CLINICAL DATA:  Head and neck trauma EXAM: CT HEAD WITHOUT CONTRAST CT CERVICAL SPINE WITHOUT CONTRAST TECHNIQUE: Multidetector CT imaging of the head and cervical spine was performed following the standard protocol without intravenous contrast. Multiplanar CT image reconstructions of the cervical spine were also generated. RADIATION DOSE REDUCTION: This exam was performed according to the departmental dose-optimization program which includes automated exposure control, adjustment of the mA and/or kV according to patient size and/or use of iterative reconstruction technique. COMPARISON:  CT brain 03/16/2023, CT brain and cervical spine 10/02/2022 FINDINGS: CT HEAD FINDINGS Brain: Limited by artifact from patient's left cochlear implant. No gross territorial infarction, hemorrhage or intracranial mass. Atrophy and advanced chronic small vessel ischemic changes of the white matter. Chronic left thalamic lacunar infarct. Stable ventricle size. Vascular: No hyperdense vessels. Vertebral and carotid vascular calcification Skull: No fracture.  Left cochlear implant Sinuses/Orbits: No acute finding. Other: None CT CERVICAL SPINE FINDINGS Alignment: No subluxation.  Facet alignment is normal. Skull base and vertebrae: Chronic superior endplate deformity and mild compression C7, T1 and T2. No acute fracture is seen. Soft tissues and spinal canal: No prevertebral fluid or swelling. No visible canal hematoma. Disc levels: Advanced disc space narrowing C5-C6. Multilevel facet degenerative changes. No high-grade bony canal stenosis. Chronic fusion of left C2-C3 facets. Moderate severe C1-C2 degenerative change Upper chest: No acute finding Other: None IMPRESSION: 1. Limited evaluation of the brain due to artifact from left cochlear implant. No definite CT evidence for acute intracranial abnormality.  Atrophy and chronic small vessel ischemic changes of the white matter. 2. Degenerative changes of the cervical spine. No acute osseous abnormality. Electronically Signed   By: Luke Bun M.D.   On: 06/04/2024 15:30   DG Chest 1 View Result Date: 06/04/2024 EXAM: 1 VIEW(S) XRAY OF THE CHEST 06/04/2024 02:33:00 PM COMPARISON: CT 05/12/2023. CLINICAL HISTORY: 809823 Fall 190176. FINDINGS: LUNGS AND PLEURA: No focal pulmonary opacity. No pleural effusion. No pneumothorax. HEART AND MEDIASTINUM: Atherosclerotic plaque. BONES AND SOFT TISSUES: Old healed right rib fractures. IMPRESSION: 1. No acute process. Electronically signed by: Dayne Hassell MD 06/04/2024 03:14 PM EST RP Workstation: [id number]   DG Pelvis 1-2 Views Result Date: 06/04/2024 CLINICAL DATA:  Fall. EXAM: PELVIS - 1-2 VIEW COMPARISON:  None Available. FINDINGS: There is no evidence of pelvic fracture or diastasis. No pelvic bone lesions are seen. Mild osteoarthritis of both hips. Atherosclerosis of bilateral femoral arteries. IMPRESSION: No acute findings. Mild osteoarthritis of both hips. Electronically Signed   By: Marcey Moan M.D.   On: 06/04/2024 15:13    Microbiology: Results for orders placed or performed during the hospital encounter of 06/04/24  Urine Culture     Status: Abnormal (Preliminary result)   Collection Time: 06/04/24  7:04  PM   Specimen: Urine, Clean Catch  Result Value Ref Range Status   Specimen Description URINE, CLEAN CATCH  Final   Special Requests NONE  Final   Culture (A)  Final    >=100,000 COLONIES/mL PROVIDENCIA RETTGERI 80,000 COLONIES/mL CITROBACTER FREUNDII SUSCEPTIBILITIES TO FOLLOW Performed at [facility], [address], [place], [place] [address]    Report Status PENDING  Incomplete   Organism ID, Bacteria PROVIDENCIA RETTGERI (A)  Final      Susceptibility   Providencia rettgeri - MIC*    AMPICILLIN >=32 RESISTANT Resistant     CEFEPIME 4 INTERMEDIATE Intermediate     ERTAPENEM  <=0.12 SENSITIVE Sensitive     CEFTRIAXONE  0.5 SENSITIVE Sensitive     GENTAMICIN <=1 SENSITIVE Sensitive     NITROFURANTOIN 256 RESISTANT Resistant     TRIMETH /SULFA  <=20 SENSITIVE Sensitive     AMPICILLIN/SULBACTAM >=32 RESISTANT Resistant     PIP/TAZO Value in next row Resistant      >=128 RESISTANTThis is a modified FDA-approved test that has been validated and its performance characteristics determined by the reporting laboratory.  This laboratory is certified under the Clinical Laboratory Improvement Amendments CLIA as qualified to perform high complexity clinical laboratory testing.    MEROPENEM Value in next row Sensitive      >=128 RESISTANTThis is a modified FDA-approved test that has been validated and its performance characteristics determined by the reporting laboratory.  This laboratory is certified under the Clinical Laboratory Improvement Amendments CLIA as qualified to perform high complexity clinical laboratory testing.    * >=100,000 COLONIES/mL PROVIDENCIA RETTGERI    Labs: CBC: Recent Labs  Lab 06/04/24 1648 06/05/24 0315 06/06/24 0550  WBC 15.8* 10.2 12.8*  NEUTROABS 13.7*  --  10.1*  HGB 15.0 13.6 13.1  HCT 45.2 40.5 38.2*  MCV 91.5 89.8 88.8  PLT 315 270 271   Basic Metabolic Panel: Recent Labs  Lab 06/04/24 1648 06/04/24 2020 06/05/24 0315 06/06/24 0550  NA 135  --  137 132*  K 4.6  --  4.0 4.1  CL 97*  --  102 97*  CO2 26  --  26 26  GLUCOSE 166*  --  146* 151*  BUN 18  --  17 17  CREATININE 1.11  --  0.99 1.18  CALCIUM  9.5  --  8.7* 8.8*  MG  --  2.0 2.0  --   PHOS  --  3.1 2.9  --    Liver Function Tests: Recent Labs  Lab 06/04/24 1648 06/05/24 0315  AST 23 21  ALT 22 18  ALKPHOS 69 63  BILITOT 0.5 0.5  PROT 7.6 6.5  ALBUMIN 4.5 3.8   CBG: Recent Labs  Lab 06/06/24 2033 06/07/24 0000 06/07/24 0417 06/07/24 0743 06/07/24 1206  GLUCAP 152* 149* 171* 149* 225*    Discharge time spent: greater than 30  minutes.  Signed: Lonni KANDICE Moose, MD [facility] 06/07/2024 "

## 2024-06-07 NOTE — Plan of Care (Signed)
  Problem: Clinical Measurements: Goal: Respiratory complications will improve Outcome: Progressing Goal: Cardiovascular complication will be avoided Outcome: Progressing   Problem: Activity: Goal: Risk for activity intolerance will decrease Outcome: Progressing   Problem: Nutrition: Goal: Adequate nutrition will be maintained Outcome: Progressing   Problem: Coping: Goal: Level of anxiety will decrease Outcome: Progressing   Problem: Elimination: Goal: Will not experience complications related to urinary retention Outcome: Progressing   Problem: Safety: Goal: Ability to remain free from injury will improve Outcome: Progressing   Problem: Skin Integrity: Goal: Risk for impaired skin integrity will decrease Outcome: Progressing

## 2024-06-07 NOTE — Progress Notes (Signed)
" °  Progress Note   Date: 06/07/2024  Patient Name: Raymond Fowler        MRN#: [id number]  Clarification of diagnosis:  Severe malnutrition    Encourage to drink supplements.  "

## 2024-06-07 NOTE — TOC CM/SW Note (Signed)
 06-07-24 1521 ICM spoke with patient and he is from home with spouse. Patient was not previously active with [facility] and no recommendations have been made for [facility] at this time. Spouse is at the bedside and she will transport patient home via private vehicle. No further needs identified at this time.

## 2024-06-08 LAB — URINE CULTURE: Culture: >=100000 — AB

## 2024-06-17 DIAGNOSIS — G459 Transient cerebral ischemic attack, unspecified: Secondary | ICD-10-CM | POA: Insufficient documentation

## 2024-06-17 DIAGNOSIS — J449 Chronic obstructive pulmonary disease, unspecified: Secondary | ICD-10-CM | POA: Insufficient documentation

## 2024-06-17 DIAGNOSIS — K8020 Calculus of gallbladder without cholecystitis without obstruction: Secondary | ICD-10-CM | POA: Insufficient documentation

## 2024-06-17 DIAGNOSIS — H409 Unspecified glaucoma: Secondary | ICD-10-CM | POA: Insufficient documentation

## 2024-06-25 ENCOUNTER — Ambulatory Visit: Admitting: Cardiology

## 2024-07-02 ENCOUNTER — Other Ambulatory Visit: Payer: Self-pay

## 2024-07-24 ENCOUNTER — Ambulatory Visit: Admitting: Cardiology
